# Patient Record
Sex: Female | Born: 1990 | Race: Black or African American | Hispanic: No | Smoking: Current every day smoker
Health system: Southern US, Community
[De-identification: ages and names within clinical notes are randomized; demographics above are authoritative.]

## PROBLEM LIST (undated history)

## (undated) ENCOUNTER — Inpatient Hospital Stay (HOSPITAL_COMMUNITY): Payer: Self-pay

## (undated) ENCOUNTER — Emergency Department (HOSPITAL_COMMUNITY): Admission: EM | Payer: Medicaid Other | Source: Home / Self Care

## (undated) DIAGNOSIS — B009 Herpesviral infection, unspecified: Secondary | ICD-10-CM

## (undated) DIAGNOSIS — F329 Major depressive disorder, single episode, unspecified: Secondary | ICD-10-CM

## (undated) DIAGNOSIS — A562 Chlamydial infection of genitourinary tract, unspecified: Secondary | ICD-10-CM

## (undated) DIAGNOSIS — D72829 Elevated white blood cell count, unspecified: Secondary | ICD-10-CM

## (undated) DIAGNOSIS — O24419 Gestational diabetes mellitus in pregnancy, unspecified control: Secondary | ICD-10-CM

## (undated) DIAGNOSIS — F32A Depression, unspecified: Secondary | ICD-10-CM

## (undated) DIAGNOSIS — N739 Female pelvic inflammatory disease, unspecified: Secondary | ICD-10-CM

## (undated) DIAGNOSIS — F419 Anxiety disorder, unspecified: Secondary | ICD-10-CM

---
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
## 2011-01-30 ENCOUNTER — Observation Stay (HOSPITAL_COMMUNITY)
Admission: AD | Admit: 2011-01-30 | Discharge: 2011-01-30 | Disposition: A | Discharge status: Home / Self Care | Payer: Medicaid Other | Source: Ambulatory Visit | Attending: Obstetrics and Gynecology | Admitting: Obstetrics and Gynecology

## 2011-01-30 ENCOUNTER — Observation Stay (HOSPITAL_COMMUNITY): Payer: Medicaid Other

## 2011-01-30 ENCOUNTER — Emergency Department (HOSPITAL_COMMUNITY)
Admission: EM | Admit: 2011-01-30 | Discharge: 2011-01-30 | Disposition: A | Discharge status: Other Acute Inpatient Hospital | Payer: Medicaid Other | Source: Home / Self Care | Attending: Emergency Medicine | Admitting: Emergency Medicine

## 2011-01-30 DIAGNOSIS — W108XXA Fall (on) (from) other stairs and steps, initial encounter: Secondary | ICD-10-CM | POA: Insufficient documentation

## 2011-01-30 DIAGNOSIS — O99891 Other specified diseases and conditions complicating pregnancy: Principal | ICD-10-CM | POA: Insufficient documentation

## 2011-01-30 DIAGNOSIS — Y92009 Unspecified place in unspecified non-institutional (private) residence as the place of occurrence of the external cause: Secondary | ICD-10-CM | POA: Insufficient documentation

## 2011-01-30 DIAGNOSIS — R109 Unspecified abdominal pain: Secondary | ICD-10-CM | POA: Insufficient documentation

## 2011-01-30 DIAGNOSIS — O368390 Maternal care for abnormalities of the fetal heart rate or rhythm, unspecified trimester, not applicable or unspecified: Secondary | ICD-10-CM | POA: Insufficient documentation

## 2011-01-30 LAB — COMPREHENSIVE METABOLIC PANEL
Alkaline Phosphatase: 56 U/L (ref 39–117)
BUN: 8 mg/dL (ref 6–23)
Chloride: 105 mEq/L (ref 96–112)
Creatinine, Ser: 0.72 mg/dL (ref 0.4–1.2)
Glucose, Bld: 81 mg/dL (ref 70–99)
Potassium: 3.8 mEq/L (ref 3.5–5.1)
Total Bilirubin: 0.4 mg/dL (ref 0.3–1.2)

## 2011-01-30 LAB — CBC
Hemoglobin: 12.1 g/dL (ref 12.0–15.0)
MCH: 29.8 pg (ref 26.0–34.0)
RDW: 14.1 % (ref 11.5–15.5)

## 2011-01-30 LAB — DIFFERENTIAL
Basophils Absolute: 0 10*3/uL (ref 0.0–0.1)
Basophils Relative: 0 % (ref 0–1)
Eosinophils Absolute: 0.1 10*3/uL (ref 0.0–0.7)
Monocytes Absolute: 0.7 10*3/uL (ref 0.1–1.0)
Neutro Abs: 13.1 10*3/uL — ABNORMAL HIGH (ref 1.7–7.7)
Neutrophils Relative %: 82 % — ABNORMAL HIGH (ref 43–77)

## 2011-01-30 LAB — KLEIHAUER-BETKE STAIN
Fetal Cells %: 0 %
Quantitation Fetal Hemoglobin: 0 mL

## 2011-02-13 LAB — URINALYSIS, ROUTINE W REFLEX MICROSCOPIC
Glucose, UA: NEGATIVE mg/dL
Hgb urine dipstick: NEGATIVE
pH: 6.5 (ref 5.0–8.0)

## 2011-02-13 LAB — URINE MICROSCOPIC-ADD ON

## 2011-02-13 LAB — URINE CULTURE

## 2011-02-14 LAB — POCT PREGNANCY, URINE: Preg Test, Ur: POSITIVE

## 2011-02-26 LAB — POCT PREGNANCY, URINE: Preg Test, Ur: NEGATIVE

## 2011-02-26 LAB — URINALYSIS, ROUTINE W REFLEX MICROSCOPIC
Ketones, ur: NEGATIVE mg/dL
Nitrite: NEGATIVE
Specific Gravity, Urine: 1.015 (ref 1.005–1.030)
pH: 6.5 (ref 5.0–8.0)

## 2011-03-13 LAB — URINE CULTURE

## 2011-03-13 LAB — URINE MICROSCOPIC-ADD ON

## 2011-03-13 LAB — URINALYSIS, ROUTINE W REFLEX MICROSCOPIC
Hgb urine dipstick: NEGATIVE
Protein, ur: 100 mg/dL — AB
Urobilinogen, UA: 1 mg/dL (ref 0.0–1.0)

## 2011-03-13 LAB — WET PREP, GENITAL: Trich, Wet Prep: NONE SEEN

## 2011-03-13 LAB — GC/CHLAMYDIA PROBE AMP, GENITAL: GC Probe Amp, Genital: NEGATIVE

## 2011-04-12 ENCOUNTER — Inpatient Hospital Stay (HOSPITAL_COMMUNITY)
Admission: AD | Admit: 2011-04-12 | Discharge: 2011-04-12 | Disposition: A | Discharge status: Home / Self Care | Payer: Medicaid Other | Source: Ambulatory Visit | Attending: Obstetrics and Gynecology | Admitting: Obstetrics and Gynecology

## 2011-04-12 DIAGNOSIS — O99891 Other specified diseases and conditions complicating pregnancy: Secondary | ICD-10-CM | POA: Insufficient documentation

## 2011-04-12 DIAGNOSIS — K2990 Gastroduodenitis, unspecified, without bleeding: Secondary | ICD-10-CM | POA: Insufficient documentation

## 2011-04-12 DIAGNOSIS — K2970 Gastritis, unspecified, without bleeding: Secondary | ICD-10-CM | POA: Insufficient documentation

## 2011-04-12 DIAGNOSIS — R109 Unspecified abdominal pain: Secondary | ICD-10-CM | POA: Insufficient documentation

## 2011-05-02 ENCOUNTER — Inpatient Hospital Stay (HOSPITAL_COMMUNITY)
Admission: AD | Admit: 2011-05-02 | Discharge: 2011-05-02 | Disposition: A | Discharge status: Home / Self Care | Payer: Medicaid Other | Source: Ambulatory Visit | Attending: Obstetrics and Gynecology | Admitting: Obstetrics and Gynecology

## 2011-05-02 DIAGNOSIS — O479 False labor, unspecified: Secondary | ICD-10-CM | POA: Insufficient documentation

## 2011-05-04 ENCOUNTER — Inpatient Hospital Stay (HOSPITAL_COMMUNITY)
Admission: AD | Admit: 2011-05-04 | Discharge: 2011-05-04 | Disposition: A | Discharge status: Home / Self Care | Payer: Medicaid Other | Source: Ambulatory Visit | Attending: Obstetrics and Gynecology | Admitting: Obstetrics and Gynecology

## 2011-05-04 DIAGNOSIS — G43909 Migraine, unspecified, not intractable, without status migrainosus: Secondary | ICD-10-CM | POA: Insufficient documentation

## 2011-05-04 DIAGNOSIS — O99891 Other specified diseases and conditions complicating pregnancy: Secondary | ICD-10-CM | POA: Insufficient documentation

## 2011-05-04 LAB — CBC
HCT: 34.7 % — ABNORMAL LOW (ref 36.0–46.0)
Hemoglobin: 12.4 g/dL (ref 12.0–15.0)
MCH: 28.3 pg (ref 26.0–34.0)
MCHC: 35.7 g/dL (ref 30.0–36.0)
MCV: 79.2 fL (ref 78.0–100.0)

## 2011-05-04 LAB — DIFFERENTIAL
Basophils Relative: 0 % (ref 0–1)
Monocytes Absolute: 0.9 10*3/uL (ref 0.1–1.0)
Monocytes Relative: 5 % (ref 3–12)
Neutro Abs: 14.1 10*3/uL — ABNORMAL HIGH (ref 1.7–7.7)

## 2011-05-04 LAB — URINALYSIS, ROUTINE W REFLEX MICROSCOPIC
Bilirubin Urine: NEGATIVE
Ketones, ur: NEGATIVE mg/dL
Nitrite: NEGATIVE
Protein, ur: NEGATIVE mg/dL
Urobilinogen, UA: 0.2 mg/dL (ref 0.0–1.0)

## 2011-05-04 LAB — LACTATE DEHYDROGENASE: LDH: 157 U/L (ref 94–250)

## 2011-05-04 LAB — COMPREHENSIVE METABOLIC PANEL
Alkaline Phosphatase: 144 U/L — ABNORMAL HIGH (ref 39–117)
BUN: 9 mg/dL (ref 6–23)
Glucose, Bld: 74 mg/dL (ref 70–99)
Potassium: 3.6 mEq/L (ref 3.5–5.1)
Total Bilirubin: 0.2 mg/dL — ABNORMAL LOW (ref 0.3–1.2)
Total Protein: 6.5 g/dL (ref 6.0–8.3)

## 2011-05-04 LAB — URIC ACID: Uric Acid, Serum: 5.4 mg/dL (ref 2.4–7.0)

## 2011-05-17 ENCOUNTER — Inpatient Hospital Stay (HOSPITAL_COMMUNITY)
Admission: AD | Admit: 2011-05-17 | Discharge: 2011-05-17 | Disposition: A | Discharge status: Home / Self Care | Payer: Medicaid Other | Source: Ambulatory Visit | Attending: Obstetrics and Gynecology | Admitting: Obstetrics and Gynecology

## 2011-05-17 DIAGNOSIS — O479 False labor, unspecified: Secondary | ICD-10-CM | POA: Insufficient documentation

## 2011-05-18 ENCOUNTER — Inpatient Hospital Stay (HOSPITAL_COMMUNITY)
Admission: AD | Admit: 2011-05-18 | Discharge: 2011-05-22 | DRG: 766 | Disposition: A | Discharge status: Home / Self Care | Payer: Medicaid Other | Source: Ambulatory Visit | Attending: Obstetrics and Gynecology | Admitting: Obstetrics and Gynecology

## 2011-05-18 LAB — CBC
Hemoglobin: 13.8 g/dL (ref 12.0–15.0)
MCHC: 36.5 g/dL — ABNORMAL HIGH (ref 30.0–36.0)
Platelets: 261 10*3/uL (ref 150–400)
RDW: 15.1 % (ref 11.5–15.5)

## 2011-05-19 ENCOUNTER — Other Ambulatory Visit: Payer: Self-pay | Admitting: Obstetrics and Gynecology

## 2011-05-19 LAB — RPR: RPR Ser Ql: NONREACTIVE

## 2011-05-20 LAB — CBC
Platelets: 192 10*3/uL (ref 150–400)
RDW: 15.3 % (ref 11.5–15.5)
WBC: 20.9 10*3/uL — ABNORMAL HIGH (ref 4.0–10.5)

## 2011-05-22 ENCOUNTER — Inpatient Hospital Stay (HOSPITAL_COMMUNITY): Payer: Medicaid Other

## 2011-05-29 NOTE — Discharge Summary (Signed)
Heather Lane, Heather Lane              ACCOUNT NO.:  [id number]  MEDICAL RECORD NO.:  [id number]  LOCATION:  9123                          FACILITY:  [facility]  PHYSICIAN:  Janine Limbo, M.D.DATE OF BIRTH:  Feb 15, 1991  DATE OF ADMISSION:  05/18/2011 DATE OF DISCHARGE:  05/22/2011                              DISCHARGE SUMMARY   ADMITTING DIAGNOSES: 1. Intrauterine pregnancy at 40-3/7th weeks. 2. Early labor with prolonged prodromal labor. 3. Group B strep negative.  DISCHARGE DIAGNOSES: 1. Intrauterine pregnancy at 40-4/7th weeks. 2. Failure to progress. 3. Nonreassuring fetal heart rate. 4. Meconium-stained fluid. 5. Postpartum anemia.  PROCEDURES: 1. Therapeutic sedation with morphine and Phenergan. 2. Primary low transverse cesarean section. 3. Epidural anesthesia.  HOSPITAL COURSE:  Heather Lane is an 20 year old gravida 1, para 0 at 77- 3/7th weeks, who presented on the morning of May 18, 2011, with 24 hours of uterine contractions that were painful.  She had been seen the day before in Maternity Admissions Unit with no cervical change after observation.  She was seen again in Maternity Admissions Unit on the day of admission.  Her cervix was still 1 cm, 50%.  She then received therapeutic rest with morphine and Phenergan and her cervix changed to 3 and 80%.  She was then admitted to Labor and Delivery.  After admission, she was observed for a time.  She then had some IV pain medication. Artificial rupture of membranes was deferred at this time and she was 4 cm.  She received an epidural later that evening.  There were some variables noted but overall fetal heart rate was reassuring.  At approximately 3 a.m., she had been 6, 100%, and 0 station.  Fetal heart rate is reassuring.  Pitocin augmentation was occurring.  By 6 a.m., she was still the same.  Cervix was becoming slightly edematous.  She was then assessed at 7:50 and cervix was even more edematous.  MVUs  were adequate and fetal heart rate was running 170-180.  She was therefore taken to the OR where a primary low transverse cesarean section was performed by Dr. Normand Sloop under epidural anesthesia.  Findings were a viable female, weight 6 pounds 12 ounces, Apgars were 9 and 9.  Infant was taken to the full-term nursery.  Mother was taken to recovery in good condition.  Her pregnancy in  general had been remarkable for: 1. Age 30. 2. History of STDs. 3. History of HSV 1. 4. Positive hemoglobin C. 5. Group B strep negative.  On postop day #1, the patient was doing well.  She was up ad lib.  She is tolerating a regular diet.  Her hemoglobin was 8.8, white blood cell count was 20.9 and platelet count was 192.  The father of the baby was present with her, although he was not her current partner.  The family did work through these issues and by the patient's postpartum stay, these issues have been dealt with, and all parties were communicating well.  Rest of the patient's hospital stay was uncomplicated.  Her orthostatics were stable.  She was up ad lib and ambulating without difficulty.  She was passing flatus.  She was breast-feeding.  By postop day #  3, she was continued to do well, however, she did notice some "lumps in her abdomen."  These were felt by the nurse midwife of record to the probable stool in her lower intestine.  A flat plate was done, showing a nonobstructive bowel gas pattern with stool throughout the colon to the level of the rectum.  No free air was noted.  There was a small curvature of the lumbar spine noted, otherwise it was unremarkable.  She was ready to use Depo-Provera and received a dose on the day of discharge.  She was then deemed to receive full benefit of her hospital stay and was discharged home in stable condition.  DISCHARGE INSTRUCTIONS:  Per [facility] handout.  DISCHARGE MEDICATIONS: 1. Motrin 600 mg p.o. q.2 hours p.r.n. pain. 2. Percocet  5/325, 1-2 p.o. q.3-4 hours p.r.n. pain.  Discharge followup will occur in 6 weeks at [facility] or p.r.n.     Renaldo Reel Heather Lane, C.N.M.   ______________________________ Janine Limbo, M.D.    VLL/MEDQ  D:  05/22/2011  T:  05/23/2011  Job:  [id number]  Electronically Signed by Nigel Bridgeman C.N.M. on 05/23/2011 03:00:16 PM Electronically Signed by Kirkland Hun M.D. on 05/29/2011 07:24:07 AM

## 2011-06-06 NOTE — Op Note (Signed)
Heather, Lane              ACCOUNT NO.:  [id number]  MEDICAL RECORD NO.:  [id number]  LOCATION:  9123                          FACILITY:  [facility]  PHYSICIAN:  Heather Lane, M.D. DATE OF BIRTH:  10-Jan-1991  DATE OF PROCEDURE:  05/19/2011 DATE OF DISCHARGE:                              OPERATIVE REPORT   PREOPERATIVE DIAGNOSES:  Term, failure to progress, nonreassuring fetal heart tones with meconium, active labor.  POSTOPERATIVE DIAGNOSES:  Term, failure to progress, nonreassuring fetal heart tones with meconium, active labor.  PROCEDURE:  A primary low transverse cesarean section with two-layer closure.  SURGEON:  Heather Fontenette A. Ethelreda Sukhu, MD  ASSISTANT:  __________, CNM  ANESTHESIA:  Epidural.  FINDINGS:  Female infant in vertex presentation, born at 8:36 a.m. Apgars of 9 and 9.  A weight of 6 pounds 12 ounces and meconium noted. Placenta was sent to Pathology.  ESTIMATED BLOOD LOSS:  1000 mL.  URINE OUTPUT:  250 mL of clear urine at the end of procedure.  LR:  1500 mL.  COMPLICATIONS:  None.  The patient to PACU in stable condition.  PROCEDURE IN DETAIL:  The patient was taken to the operating room where epidural anesthesia was found to be adequate.  She was prepped and draped in a normal sterile fashion.  Foley catheter was placed.  A low transverse Pfannenstiel incision was made with a scalpel and the patient epidural anesthesia had been tested and anesthesia was found to be adequate, did feel a little bit of pain.  So we stopped the procedure and injected 7 mL of 0.5% Marcaine with epi into the skin.  Once that was completed, the incision was then completed and the patient had no pain.  The incision was carried down to the fascia using Bovie cautery. The fascia was incised in the midline, extended bilaterally with Mayo scissors.  Kochers x2 was placed in the superior aspect of the fascia and was dissected off the rectus muscle in a similar fashion.   The inferior aspect of the fascia was dissected in a similar fashion with Kochers and Mayo scissors.  The muscles were separated in midline.  The peritoneum was identified and entered bluntly.  The muscles were then further separated.  The bladder blade was inserted.  The vesicouterine peritoneum was identified, tented up, entered sharply, and extended bilaterally using Metzenbaum scissors.  Bladder blade was reinserted.  A primary low transverse uterine incision was made with a scalpel and dissected bluntly bilaterally.  The infant was noted to have meconium. The infant was delivered without difficulty.  Cord was clamped and cut. Mouth and nares were bulb suctioned and handed off to awaiting neonatologist.  The placenta was delivered intact with several calcifications.  The uterus was cleared of all clot and debris.  The uterine incision was repaired with 0 Vicryl in a running locked fashion. There was a sinus on the posterior aspect of the incision which continued to bleed, I had to put 2 figure-of-eight sutures to stop it from bleeding, and then a second layer of 0 Vicryl was used to imbricate the uterus.  Irrigation was done, and there was a little bit of bleeding towards the patient's left  side of the uterus.  This was made hemostatic with a figure-of-eight of 0 Vicryl stitch.  Again irrigation was done. The patient had normal abdominal anatomy, normal-appearing tubes and ovaries and uterus.  Once hemostasis was assured, the peritoneum was closed with 0 chromic in a running fashion.  The fascia was closed with 0 Vicryl without difficulty.  The subcutaneous tissues were reapproximated with 2-0 plain, and the skin was closed 3-0 Monocryl in a subcuticular fashion.  Sponge, lap, and needle counts were correct.  The patient went to recovery room in stable condition.     Heather Lane, M.D.     NAD/MEDQ  D:  05/19/2011  T:  05/19/2011  Job:  [id number]  Electronically Signed by  Heather Lane M.D. on 06/06/2011 01:00:19 PM

## 2011-07-09 ENCOUNTER — Emergency Department (HOSPITAL_COMMUNITY)
Admission: EM | Admit: 2011-07-09 | Discharge: 2011-07-10 | Disposition: A | Discharge status: Home / Self Care | Payer: Medicaid Other | Attending: Emergency Medicine | Admitting: Emergency Medicine

## 2011-07-09 DIAGNOSIS — S02400A Malar fracture unspecified, initial encounter for closed fracture: Secondary | ICD-10-CM | POA: Insufficient documentation

## 2011-07-09 DIAGNOSIS — S02401A Maxillary fracture, unspecified, initial encounter for closed fracture: Secondary | ICD-10-CM | POA: Insufficient documentation

## 2011-07-09 DIAGNOSIS — S301XXA Contusion of abdominal wall, initial encounter: Secondary | ICD-10-CM | POA: Insufficient documentation

## 2011-07-09 DIAGNOSIS — R51 Headache: Secondary | ICD-10-CM | POA: Insufficient documentation

## 2011-07-09 DIAGNOSIS — R109 Unspecified abdominal pain: Secondary | ICD-10-CM | POA: Insufficient documentation

## 2011-07-09 DIAGNOSIS — S01501A Unspecified open wound of lip, initial encounter: Secondary | ICD-10-CM | POA: Insufficient documentation

## 2011-07-09 DIAGNOSIS — M25539 Pain in unspecified wrist: Secondary | ICD-10-CM | POA: Insufficient documentation

## 2011-07-09 DIAGNOSIS — IMO0002 Reserved for concepts with insufficient information to code with codable children: Secondary | ICD-10-CM | POA: Insufficient documentation

## 2011-07-10 ENCOUNTER — Emergency Department (HOSPITAL_COMMUNITY): Payer: Medicaid Other

## 2011-07-10 MED ORDER — IOHEXOL 300 MG/ML  SOLN
100.0000 mL | Freq: Once | INTRAMUSCULAR | Status: AC | PRN
  Administered 2011-07-10: 100 mL via INTRAVENOUS

## 2011-08-15 ENCOUNTER — Inpatient Hospital Stay (INDEPENDENT_AMBULATORY_CARE_PROVIDER_SITE_OTHER)
Admission: RE | Admit: 2011-08-15 | Discharge: 2011-08-15 | Disposition: A | Discharge status: Home / Self Care | Payer: Medicaid Other | Source: Ambulatory Visit | Attending: Family Medicine | Admitting: Family Medicine

## 2011-08-15 DIAGNOSIS — J029 Acute pharyngitis, unspecified: Secondary | ICD-10-CM

## 2011-08-15 DIAGNOSIS — J039 Acute tonsillitis, unspecified: Secondary | ICD-10-CM

## 2011-08-28 LAB — WET PREP, GENITAL

## 2011-08-28 LAB — URINALYSIS, ROUTINE W REFLEX MICROSCOPIC
Ketones, ur: 15 — AB
Nitrite: NEGATIVE
Protein, ur: NEGATIVE
Urobilinogen, UA: 1
pH: 6.5

## 2011-08-28 LAB — CBC
HCT: 37.4
Hemoglobin: 13
RBC: 4.47
WBC: 17.9 — ABNORMAL HIGH

## 2011-08-28 LAB — DIFFERENTIAL
Eosinophils Relative: 3
Lymphocytes Relative: 12 — ABNORMAL LOW
Lymphs Abs: 2.1
Monocytes Absolute: 1.1

## 2011-08-28 LAB — POCT PREGNANCY, URINE: Operator id: 277751

## 2011-08-28 LAB — URINE MICROSCOPIC-ADD ON

## 2011-08-28 LAB — GC/CHLAMYDIA PROBE AMP, GENITAL: GC Probe Amp, Genital: NEGATIVE

## 2012-07-29 ENCOUNTER — Ambulatory Visit: Payer: Medicaid Other | Admitting: Obstetrics and Gynecology

## 2012-07-30 ENCOUNTER — Ambulatory Visit: Payer: Self-pay | Admitting: Obstetrics and Gynecology

## 2012-10-23 ENCOUNTER — Encounter: Payer: Self-pay | Admitting: Obstetrics and Gynecology

## 2012-10-23 ENCOUNTER — Ambulatory Visit (INDEPENDENT_AMBULATORY_CARE_PROVIDER_SITE_OTHER): Payer: Medicaid Other | Admitting: Obstetrics and Gynecology

## 2012-10-23 VITALS — BP 98/60 | Ht 63.0 in | Wt 131.0 lb

## 2012-10-23 DIAGNOSIS — N970 Female infertility associated with anovulation: Secondary | ICD-10-CM

## 2012-10-23 DIAGNOSIS — Z00129 Encounter for routine child health examination without abnormal findings: Secondary | ICD-10-CM

## 2012-10-23 MED ORDER — ETONOGESTREL-ETHINYL ESTRADIOL 0.12-0.015 MG/24HR VA RING: VAGINAL_RING | VAGINAL | Status: DC

## 2012-10-23 NOTE — Progress Notes (Signed)
The patient reports:using the bathroom more frequently x 8 months   Contraception:none  Last mammogram: n/a Last pap: n/a  GC/Chlamydia cultures offered: declined HIV/RPR/HbsAg offered:  declined HSV 1 and 2 glycoprotein offered: declined  Menstrual cycle regular and monthly: No: very irregular has had a cycle as 2 weeks long and then skip month. Last Depo-Provera Sept 2012. Post partum 6 weeks of bleeding - depo provera - no cycles for 2 months. No bleeding after Depo provera in 08/2011. Cycle restarted January 2013. Cycles 28 - 60 days. Duration: 10 days - 3 weeks. Able to wear tampon for 4 hours. "string" clots. Cramping in lower abdomin radiating to back. Pain scale = 4/10. Takes tylenol for pain.   Menstrual flow normal: Yes  Urinary symptoms: urinary frequency Normal bowel movements: Yes Reports abuse at home: No:    Subjective    Heather Lane is a 21 y.o. female, G1P1, who presents for an annual exam.     History   Social History  . Marital Status: Single    Spouse Name: N/A    Number of Children: N/A  . Years of Education: N/A   Social History Main Topics  . Smoking status: Current Every Day Smoker  . Smokeless tobacco: Never Used  . Alcohol Use: No  . Drug Use: No  . Sexually Active: Yes -- Female partner(s)    Birth Control/ Protection: None   Other Topics Concern  . None   Social History Narrative  . None    Menstrual cycle:   LMP: Patient's last menstrual period was 09/30/2012.           Cycle: see above  The following portions of the patient's history were reviewed and updated as appropriate: allergies, current medications, past family history, past medical history, past social history, past surgical history and problem list.  Review of Systems Pertinent items are noted in HPI. Breast:Negative for breast lump,nipple discharge or nipple retraction Gastrointestinal: Negative for abdominal pain, change in bowel habits or rectal  bleeding Urinary:negative   Objective:    BP 98/60  Ht 5\' 3"  (1.6 m)  Wt 131 lb (59.421 kg)  BMI 23.21 kg/m2  LMP 09/30/2012    Weight:  Wt Readings from Last 1 Encounters:  10/23/12 131 lb (59.421 kg) (54.97%*)   * Growth percentiles are based on CDC 2-20 Years data.          BMI: Body mass index is 23.21 kg/(m^2).  General Appearance: Alert, appropriate appearance for age. No acute distress HEENT: Grossly normal Neck / Thyroid: Supple, no masses, nodes or enlargement Lungs: clear to auscultation bilaterally Back: No CVA tenderness Breast Exam: No masses or nodes.No dimpling, nipple retraction or discharge. Cardiovascular: Regular rate and rhythm. S1, S2, no murmur Gastrointestinal: Soft, non-tender, no masses or organomegaly Pelvic Exam: Vulva and vagina appear normal. Bimanual exam reveals normal uterus and adnexa. Rectovaginal: not indicated Lymphatic Exam: Non-palpable nodes in neck, clavicular, axillary, or inguinal regions Skin: no rash or abnormalities Neurologic: Normal gait and speech, no tremor  Psychiatric: Alert and oriented, appropriate affect.   Assessment:    Likely anovulatory bleding  Some overactive bladder   Plan:     STD screening: declined Contraception:no method Progesterone Discussed  Removal of Caffeine and Carbonated Drinks discussed  Combined oral contraceptives was reviewed with the patient      With expected benefits of: cycle control, reduction in menstrual flow and dysmenorrhea, improvement of PMS and reduction of ovarian cysts and ovarian cancer. Risks  of DVT/PE discussed.  Nuvaring was also reviewed With added benefit of monthly use as opposed to daily use was also reviewed.Pt desires trial. Return in 3 months for follow-up  Tobacco use: positive for approximately 0.5 packs per day.  Patient advised to quit smoking, withouthistory of DVT/PE. Pertinent medical history:current smoker     Silverio Lay MD

## 2013-01-27 ENCOUNTER — Inpatient Hospital Stay (HOSPITAL_COMMUNITY)
Admission: AD | Admit: 2013-01-27 | Discharge: 2013-01-27 | Disposition: A | Discharge status: Home / Self Care | Payer: Medicaid Other | Source: Ambulatory Visit | Attending: Obstetrics and Gynecology | Admitting: Obstetrics and Gynecology

## 2013-01-27 DIAGNOSIS — N912 Amenorrhea, unspecified: Secondary | ICD-10-CM | POA: Insufficient documentation

## 2013-01-27 DIAGNOSIS — O34219 Maternal care for unspecified type scar from previous cesarean delivery: Secondary | ICD-10-CM | POA: Insufficient documentation

## 2013-01-27 DIAGNOSIS — F17210 Nicotine dependence, cigarettes, uncomplicated: Secondary | ICD-10-CM | POA: Insufficient documentation

## 2013-01-27 DIAGNOSIS — Z3202 Encounter for pregnancy test, result negative: Secondary | ICD-10-CM | POA: Insufficient documentation

## 2013-01-27 LAB — POCT PREGNANCY, URINE: Preg Test, Ur: NEGATIVE

## 2013-01-27 MED ORDER — MEDROXYPROGESTERONE ACETATE 150 MG/ML IM SUSP: 150.0000 mg | INTRAMUSCULAR | Status: DC

## 2013-01-27 NOTE — MAU Provider Note (Signed)
History   22 yo G1P1 presented unannounced requesting a pregnancy test due to no cycle since 12/21/12, with cycle due approx 2/19. Had SVD 2012, with DepoProvera as contraceptive choice after that.  She had irregular bleeding, and was started on Nuvaring in November.  Had cycle in December and January.  Used Nuvaring until January, when she stopped--has not been using any contraception since then, "but doesn't want to get pregnant".  Wants to restart DepoProvera--plans to schedule appt in office to restart.  Last IC yesterday.    Has not taken a pregnancy test.  "Just wanted to be sure", when asked why she came to hospital for UPT.  Patient Active Problem List  Diagnosis  . Previous cesarean delivery  . Smoker    Chief Complaint  Patient presents with  . Possible Pregnancy     OB History   Grav Para Term Preterm Abortions TAB SAB Ect Mult Living   1 1        1       No past medical history on file.  Past Surgical History  Procedure Laterality Date  . Cesarean section      Family History  Problem Relation Age of Onset  . Diabetes Sister   . Diabetes Maternal Aunt   . Hypertension Maternal Aunt   . Diabetes Maternal Uncle   . Hypertension Maternal Uncle     History  Substance Use Topics  . Smoking status: Current Every Day Smoker  . Smokeless tobacco: Never Used  . Alcohol Use: No    Allergies: No Known Allergies  No prescriptions prior to admission     Physical Exam   Blood pressure 124/89, pulse 78, temperature 98.3 F (36.8 C), temperature source Oral, resp. rate 18, height 5\' 5"  (1.651 m), weight 124 lb (56.246 kg), last menstrual period 12/21/2012, SpO2 100.00%.  PE deferred due to no physical complaints  UPT negative  ED Course  Amenorrhea--s/p stopping Nuvaring Non-contraceptor Desires restart of DepoProvera  Plan: Reviewed necessity of using contraceptive protection.  Discussed that UPT is negative at present, but she could have conceived  recently, with UPT still negative.  Advised no unprotected IC until restarts Depo.   Will call to schedule appt with me in 2 weeks.  If UPT negative then, and no unprotected IC, I will restart her on Depo. To call with any questions or concerns.   Nigel Bridgeman CNM, [place] 01/27/2013 8:28 PM

## 2013-01-27 NOTE — MAU Note (Signed)
Pt states she is just here to get a preg test. Denies problems

## 2013-07-09 ENCOUNTER — Emergency Department (HOSPITAL_COMMUNITY)
Admission: EM | Admit: 2013-07-09 | Discharge: 2013-07-09 | Disposition: A | Discharge status: Home / Self Care | Payer: Self-pay | Attending: Emergency Medicine | Admitting: Emergency Medicine

## 2013-07-09 ENCOUNTER — Encounter (HOSPITAL_COMMUNITY): Payer: Self-pay | Admitting: Emergency Medicine

## 2013-07-09 DIAGNOSIS — M545 Low back pain, unspecified: Secondary | ICD-10-CM | POA: Insufficient documentation

## 2013-07-09 DIAGNOSIS — R3915 Urgency of urination: Secondary | ICD-10-CM | POA: Insufficient documentation

## 2013-07-09 DIAGNOSIS — R300 Dysuria: Secondary | ICD-10-CM | POA: Insufficient documentation

## 2013-07-09 DIAGNOSIS — Z3202 Encounter for pregnancy test, result negative: Secondary | ICD-10-CM | POA: Insufficient documentation

## 2013-07-09 DIAGNOSIS — F17200 Nicotine dependence, unspecified, uncomplicated: Secondary | ICD-10-CM | POA: Insufficient documentation

## 2013-07-09 DIAGNOSIS — R350 Frequency of micturition: Secondary | ICD-10-CM | POA: Insufficient documentation

## 2013-07-09 LAB — URINALYSIS, ROUTINE W REFLEX MICROSCOPIC
Bilirubin Urine: NEGATIVE
Glucose, UA: NEGATIVE mg/dL
Ketones, ur: NEGATIVE mg/dL
Nitrite: NEGATIVE
Protein, ur: NEGATIVE mg/dL

## 2013-07-09 LAB — URINE MICROSCOPIC-ADD ON

## 2013-07-09 MED ORDER — CIPROFLOXACIN HCL 500 MG PO TABS: 500.0000 mg | ORAL_TABLET | Freq: Two times a day (BID) | ORAL | Status: DC

## 2013-07-09 MED ORDER — PHENAZOPYRIDINE HCL 200 MG PO TABS: 200.0000 mg | ORAL_TABLET | Freq: Three times a day (TID) | ORAL | Status: DC

## 2013-07-09 NOTE — ED Notes (Signed)
Pt presenting to ed with urinary frequency, urgency and pressure. Pt states when she wipes she is also seeing blood on the tissue. Pt states she has history of bladder problems s/p c-section in 2012

## 2013-07-09 NOTE — ED Provider Notes (Signed)
CSN: [id number]     Arrival date & time 07/09/13  1247 History     First MD Initiated Contact with Patient 07/09/13 1256     Chief Complaint  Patient presents with  . Dysuria   (Consider location/radiation/quality/duration/timing/severity/associated sxs/prior Treatment) Patient is a 22 y.o. female presenting with dysuria. The history is provided by the patient and medical records.  Dysuria  Patient presented to the ED with complaint of urinary frequency, urgency, and pelvic pressure upon urination beginning this morning. Patient states after the birth of her daughter via cesarean section in 2012, she has had problems with overactive bladder and recurrent urinary tract infections. Patient states there was some blood in her urine this morning-- she is certain this is not menstrual blood.  Patient also notes some intermittent, mild, non-radiating, left lower back pain, worse when urinating.  No fevers, sweats, or chills. No vaginal discharge or concern for STD at this time.  No hx of kidney stones.  History reviewed. No pertinent past medical history. Past Surgical History  Procedure Laterality Date  . Cesarean section     Family History  Problem Relation Age of Onset  . Diabetes Sister   . Diabetes Maternal Aunt   . Hypertension Maternal Aunt   . Diabetes Maternal Uncle   . Hypertension Maternal Uncle    History  Substance Use Topics  . Smoking status: Current Every Day Smoker -- 10.00 packs/day    Types: Cigarettes  . Smokeless tobacco: Never Used  . Alcohol Use: No   OB History   Grav Para Term Preterm Abortions TAB SAB Ect Mult Living   1 1        1      Review of Systems  Genitourinary: Positive for dysuria, urgency and hematuria.  All other systems reviewed and are negative.    Allergies  Review of patient's allergies indicates no known allergies.  Home Medications  No current outpatient prescriptions on file. BP 93/57  Pulse 76  Temp(Src) 99 F (37.2 C) (Oral)   Resp 18  SpO2 98%  LMP 07/02/2013  Physical Exam  Nursing note and vitals reviewed. Constitutional: She is oriented to person, place, and time. She appears well-developed and well-nourished. No distress.  HENT:  Head: Normocephalic and atraumatic.  Eyes: Conjunctivae and EOM are normal. Pupils are equal, round, and reactive to light.  Neck: Normal range of motion. Neck supple.  Cardiovascular: Normal rate, regular rhythm and normal heart sounds.   Pulmonary/Chest: Effort normal and breath sounds normal. No respiratory distress. She has no wheezes.  Abdominal: Soft. Bowel sounds are normal. There is no tenderness. There is CVA tenderness (mild, left). There is no guarding.  Musculoskeletal: Normal range of motion. She exhibits no edema.  Neurological: She is alert and oriented to person, place, and time.  Skin: Skin is warm and dry. She is not diaphoretic.  Psychiatric: She has a normal mood and affect.    ED Course   Procedures (including critical care time)  Labs Reviewed  URINALYSIS, ROUTINE W REFLEX MICROSCOPIC - Abnormal; Notable for the following:    APPearance CLOUDY (*)    Hgb urine dipstick LARGE (*)    Leukocytes, UA LARGE (*)    All other components within normal limits  URINE MICROSCOPIC-ADD ON - Abnormal; Notable for the following:    Bacteria, UA FEW (*)    All other components within normal limits  URINE CULTURE  POCT PREGNANCY, URINE   No results found. 1. Dysuria  2. Urinary frequency     MDM   u-preg negative.  U/a without definitive infection, culture pending.  Sx possibly due to kidney stones but pt has no prior hx of this.  Given pts hx and current sx, more likely early pyelonephritis. Will start on cipro and pyridium.  FU with wellness clinic if sx not improving.  Discussed plan with pt, she agreed.  Return precautions advised.  Garlon Hatchet, PA-C 07/09/13 1545

## 2013-07-09 NOTE — ED Provider Notes (Signed)
Medical screening examination/treatment/procedure(s) were performed by non-physician practitioner and as supervising physician I was immediately available for consultation/collaboration.    Ciin Brazzel R Gustavia Carie, MD 07/09/13 1608 

## 2013-07-09 NOTE — Progress Notes (Signed)
[facility] provided patient with a list of primary care resources and a [facility] orange card application, which will help get her set up with a pcp.

## 2013-07-10 LAB — URINE CULTURE
Colony Count: NO GROWTH
Culture: NO GROWTH

## 2014-03-12 ENCOUNTER — Encounter (HOSPITAL_COMMUNITY): Payer: Self-pay | Admitting: Emergency Medicine

## 2014-03-12 ENCOUNTER — Inpatient Hospital Stay (HOSPITAL_COMMUNITY)
Admission: EM | Admit: 2014-03-12 | Discharge: 2014-03-15 | DRG: 759 | Disposition: A | Discharge status: Home / Self Care | Payer: Medicaid Other | Attending: Internal Medicine | Admitting: Internal Medicine

## 2014-03-12 ENCOUNTER — Emergency Department (HOSPITAL_COMMUNITY): Payer: Medicaid Other

## 2014-03-12 DIAGNOSIS — E876 Hypokalemia: Secondary | ICD-10-CM

## 2014-03-12 DIAGNOSIS — Z791 Long term (current) use of non-steroidal anti-inflammatories (NSAID): Secondary | ICD-10-CM

## 2014-03-12 DIAGNOSIS — K529 Noninfective gastroenteritis and colitis, unspecified: Secondary | ICD-10-CM

## 2014-03-12 DIAGNOSIS — R000 Tachycardia, unspecified: Secondary | ICD-10-CM

## 2014-03-12 DIAGNOSIS — D72829 Elevated white blood cell count, unspecified: Secondary | ICD-10-CM | POA: Diagnosis present

## 2014-03-12 DIAGNOSIS — N730 Acute parametritis and pelvic cellulitis: Secondary | ICD-10-CM

## 2014-03-12 DIAGNOSIS — R109 Unspecified abdominal pain: Secondary | ICD-10-CM

## 2014-03-12 DIAGNOSIS — F17200 Nicotine dependence, unspecified, uncomplicated: Secondary | ICD-10-CM

## 2014-03-12 DIAGNOSIS — O34219 Maternal care for unspecified type scar from previous cesarean delivery: Secondary | ICD-10-CM

## 2014-03-12 DIAGNOSIS — A549 Gonococcal infection, unspecified: Secondary | ICD-10-CM

## 2014-03-12 DIAGNOSIS — I880 Nonspecific mesenteric lymphadenitis: Secondary | ICD-10-CM

## 2014-03-12 DIAGNOSIS — K5289 Other specified noninfective gastroenteritis and colitis: Secondary | ICD-10-CM

## 2014-03-12 DIAGNOSIS — R509 Fever, unspecified: Secondary | ICD-10-CM | POA: Diagnosis present

## 2014-03-12 DIAGNOSIS — N739 Female pelvic inflammatory disease, unspecified: Principal | ICD-10-CM | POA: Diagnosis present

## 2014-03-12 LAB — LIPASE, BLOOD: Lipase: 16 U/L (ref 11–59)

## 2014-03-12 LAB — WET PREP, GENITAL
Trich, Wet Prep: NONE SEEN
Yeast Wet Prep HPF POC: NONE SEEN

## 2014-03-12 LAB — CBC WITH DIFFERENTIAL/PLATELET
Basophils Absolute: 0 10*3/uL (ref 0.0–0.1)
Basophils Relative: 0 % (ref 0–1)
EOS ABS: 0 10*3/uL (ref 0.0–0.7)
Eosinophils Relative: 0 % (ref 0–5)
HEMATOCRIT: 38.7 % (ref 36.0–46.0)
Hemoglobin: 14.4 g/dL (ref 12.0–15.0)
LYMPHS ABS: 0.8 10*3/uL (ref 0.7–4.0)
Lymphocytes Relative: 4 % — ABNORMAL LOW (ref 12–46)
MCH: 30.8 pg (ref 26.0–34.0)
MCHC: 37.2 g/dL — ABNORMAL HIGH (ref 30.0–36.0)
MCV: 82.9 fL (ref 78.0–100.0)
MONO ABS: 0.3 10*3/uL (ref 0.1–1.0)
MONOS PCT: 1 % — AB (ref 3–12)
Neutro Abs: 21.4 10*3/uL — ABNORMAL HIGH (ref 1.7–7.7)
Neutrophils Relative %: 95 % — ABNORMAL HIGH (ref 43–77)
Platelets: 229 10*3/uL (ref 150–400)
RBC: 4.67 MIL/uL (ref 3.87–5.11)
RDW: 14.6 % (ref 11.5–15.5)
WBC: 22.5 10*3/uL — ABNORMAL HIGH (ref 4.0–10.5)

## 2014-03-12 LAB — URINALYSIS, ROUTINE W REFLEX MICROSCOPIC
Glucose, UA: NEGATIVE mg/dL
Hgb urine dipstick: NEGATIVE
KETONES UR: NEGATIVE mg/dL
Nitrite: NEGATIVE
PH: 7.5 (ref 5.0–8.0)
PROTEIN: 30 mg/dL — AB
Specific Gravity, Urine: 1.027 (ref 1.005–1.030)
Urobilinogen, UA: 1 mg/dL (ref 0.0–1.0)

## 2014-03-12 LAB — COMPREHENSIVE METABOLIC PANEL
ALK PHOS: 49 U/L (ref 39–117)
ALT: 21 U/L (ref 0–35)
AST: 15 U/L (ref 0–37)
Albumin: 4.1 g/dL (ref 3.5–5.2)
BUN: 12 mg/dL (ref 6–23)
CO2: 24 mEq/L (ref 19–32)
Calcium: 9.2 mg/dL (ref 8.4–10.5)
Chloride: 101 mEq/L (ref 96–112)
Creatinine, Ser: 0.98 mg/dL (ref 0.50–1.10)
GFR calc non Af Amer: 82 mL/min — ABNORMAL LOW (ref >90)
GLUCOSE: 102 mg/dL — AB (ref 70–99)
POTASSIUM: 3.4 meq/L — AB (ref 3.7–5.3)
Sodium: 139 mEq/L (ref 137–147)
TOTAL PROTEIN: 7.1 g/dL (ref 6.0–8.3)
Total Bilirubin: 1.2 mg/dL (ref 0.3–1.2)

## 2014-03-12 LAB — URINE MICROSCOPIC-ADD ON

## 2014-03-12 LAB — POC URINE PREG, ED: PREG TEST UR: NEGATIVE

## 2014-03-12 MED ORDER — HYDROMORPHONE HCL PF 1 MG/ML IJ SOLN
1.0000 mg | INTRAMUSCULAR | Status: DC | PRN
  Administered 2014-03-12 – 2014-03-13 (×4): 1 mg via INTRAVENOUS
  Filled 2014-03-12 (×4): qty 1

## 2014-03-12 MED ORDER — HYDROMORPHONE HCL PF 1 MG/ML IJ SOLN
1.0000 mg | Freq: Once | INTRAMUSCULAR | Status: AC
  Administered 2014-03-12: 1 mg via INTRAVENOUS
  Filled 2014-03-12: qty 1

## 2014-03-12 MED ORDER — SODIUM CHLORIDE 0.9 % IV SOLN: INTRAVENOUS | Status: DC

## 2014-03-12 MED ORDER — CIPROFLOXACIN IN D5W 400 MG/200ML IV SOLN
400.0000 mg | Freq: Once | INTRAVENOUS | Status: AC
  Administered 2014-03-12: 400 mg via INTRAVENOUS
  Filled 2014-03-12: qty 200

## 2014-03-12 MED ORDER — METRONIDAZOLE IN NACL 5-0.79 MG/ML-% IV SOLN
500.0000 mg | Freq: Once | INTRAVENOUS | Status: AC
  Administered 2014-03-12: 500 mg via INTRAVENOUS
  Filled 2014-03-12: qty 100

## 2014-03-12 MED ORDER — IOHEXOL 300 MG/ML  SOLN
80.0000 mL | Freq: Once | INTRAMUSCULAR | Status: AC | PRN
  Administered 2014-03-12: 80 mL via INTRAVENOUS

## 2014-03-12 MED ORDER — ACETAMINOPHEN 325 MG PO TABS: 650.0000 mg | ORAL_TABLET | Freq: Four times a day (QID) | ORAL | Status: DC | PRN

## 2014-03-12 MED ORDER — POTASSIUM CHLORIDE 10 MEQ/100ML IV SOLN
10.0000 meq | INTRAVENOUS | Status: AC
  Administered 2014-03-12 (×2): 10 meq via INTRAVENOUS
  Filled 2014-03-12 (×4): qty 100

## 2014-03-12 MED ORDER — SODIUM CHLORIDE 0.9 % IV BOLUS (SEPSIS)
1000.0000 mL | Freq: Once | INTRAVENOUS | Status: AC
  Administered 2014-03-12: 1000 mL via INTRAVENOUS

## 2014-03-12 MED ORDER — CIPROFLOXACIN IN D5W 400 MG/200ML IV SOLN
400.0000 mg | Freq: Two times a day (BID) | INTRAVENOUS | Status: DC
  Administered 2014-03-12 – 2014-03-14 (×4): 400 mg via INTRAVENOUS
  Filled 2014-03-12 (×6): qty 200

## 2014-03-12 MED ORDER — ONDANSETRON HCL 4 MG PO TABS
4.0000 mg | ORAL_TABLET | Freq: Four times a day (QID) | ORAL | Status: DC | PRN
  Administered 2014-03-15: 4 mg via ORAL
  Filled 2014-03-12: qty 1

## 2014-03-12 MED ORDER — ONDANSETRON HCL 4 MG/2ML IJ SOLN
4.0000 mg | Freq: Once | INTRAMUSCULAR | Status: AC
  Administered 2014-03-12: 4 mg via INTRAVENOUS
  Filled 2014-03-12: qty 2

## 2014-03-12 MED ORDER — METRONIDAZOLE IN NACL 5-0.79 MG/ML-% IV SOLN
500.0000 mg | Freq: Three times a day (TID) | INTRAVENOUS | Status: DC
  Administered 2014-03-12 – 2014-03-14 (×5): 500 mg via INTRAVENOUS
  Filled 2014-03-12 (×8): qty 100

## 2014-03-12 MED ORDER — SODIUM CHLORIDE 0.9 % IV SOLN
INTRAVENOUS | Status: AC
  Administered 2014-03-12: 20:00:00 via INTRAVENOUS

## 2014-03-12 MED ORDER — ONDANSETRON HCL 4 MG/2ML IJ SOLN
4.0000 mg | Freq: Three times a day (TID) | INTRAMUSCULAR | Status: DC | PRN
Start: 2014-03-12 — End: 2014-03-12

## 2014-03-12 MED ORDER — IOHEXOL 300 MG/ML  SOLN
50.0000 mL | Freq: Once | INTRAMUSCULAR | Status: AC | PRN
  Administered 2014-03-12: 50 mL via ORAL

## 2014-03-12 MED ORDER — ONDANSETRON HCL 4 MG/2ML IJ SOLN
4.0000 mg | Freq: Four times a day (QID) | INTRAMUSCULAR | Status: DC | PRN
  Administered 2014-03-13 – 2014-03-14 (×2): 4 mg via INTRAVENOUS
  Filled 2014-03-12 (×2): qty 2

## 2014-03-12 MED ORDER — ACETAMINOPHEN 500 MG PO TABS
1000.0000 mg | ORAL_TABLET | Freq: Once | ORAL | Status: AC
  Administered 2014-03-12: 1000 mg via ORAL
  Filled 2014-03-12: qty 2

## 2014-03-12 MED ORDER — ACETAMINOPHEN 650 MG RE SUPP: 650.0000 mg | Freq: Four times a day (QID) | RECTAL | Status: DC | PRN

## 2014-03-12 MED ORDER — HYDROCODONE-ACETAMINOPHEN 5-325 MG PO TABS
1.0000 | ORAL_TABLET | ORAL | Status: DC | PRN
  Administered 2014-03-13 – 2014-03-15 (×11): 2 via ORAL
  Filled 2014-03-12 (×11): qty 2

## 2014-03-12 MED ORDER — HYDROMORPHONE HCL PF 1 MG/ML IJ SOLN: 1.0000 mg | INTRAMUSCULAR | Status: DC | PRN

## 2014-03-12 NOTE — H&P (Signed)
History and Physical       Hospital Admission Note Date: 03/12/2014  Patient name: Heather Lane Medical record number: [id number] Date of birth: 03-10-1991 Age: 23 y.o. Gender: female  PCP: Pcp Not In System    Chief Complaint:  Abdominal pain for last 3-4 days, worse since yesterday  HPI: Patient is a 23 year old female with no certain past medical history who presented to the ER with severe abdominal pain, diffuse worse since yesterday. Patient reports that she started having crampy abdominal pain on Monday, 4 days ago coinciding with her menstrual cycle. Yesterday the pain became worse around 7 PM, severe, crampy, diffuse, 9/10, patient tried heating pad at home with no significant improvement. This morning patient had one episode of nausea and vomiting and watery diarrhea. As the pain was worsening, patient is in to the ER for further workup. ER workup showed white count of 22.5, potassium 3.4 abdominal and pelvic ultrasound did not show any ovarian torsion. CT abdomen and pelvis did not show any acute appendicitis but did show entritis with mesenteric adenitis.  Review of Systems:  Constitutional: Denies fever, chills, diaphoresis, poor appetite and fatigue.  HEENT: Denies photophobia, eye pain, redness, hearing loss, ear pain, congestion, sore throat, rhinorrhea, sneezing, mouth sores, trouble swallowing, neck pain, neck stiffness and tinnitus.   Respiratory: Denies SOB, DOE, cough, chest tightness,  and wheezing.   Cardiovascular: Denies chest pain, palpitations and leg swelling.  Gastrointestinal: See history of present illness  skeletal: Denies myalgias, back pain, joint swelling, arthralgias and gait problem.  Skin: Denies pallor, rash and wound.  Neurological: Denies dizziness, seizures, syncope, weakness, light-headedness, numbness and headaches.  Hematological: Denies adenopathy. Easy bruising, personal or family bleeding  history  Psychiatric/Behavioral: Denies suicidal ideation, mood changes, confusion, nervousness, sleep disturbance and agitation  Past Medical History: History reviewed. No pertinent past medical history. Past Surgical History  Procedure Laterality Date  . Cesarean section      Medications: Prior to Admission medications   Medication Sig Start Date End Date Taking? Authorizing Provider  ibuprofen (ADVIL,MOTRIN) 200 MG tablet Take 200 mg by mouth every 6 (six) hours as needed for cramping.   Yes Historical Provider, MD    Allergies:  No Known Allergies  Social History:  reports that she has been smoking Cigarettes.  She has been smoking about 1.00 pack per day. She has never used smokeless tobacco. She reports that she drinks about 1.8 ounces of alcohol per week. She reports that she does not use illicit drugs.  Family History: Family History  Problem Relation Age of Onset  . Diabetes Sister   . Diabetes Maternal Aunt   . Hypertension Maternal Aunt   . Diabetes Maternal Uncle   . Hypertension Maternal Uncle     Physical Exam: Blood pressure 96/72, pulse 121, temperature 99.2 F (37.3 C), temperature source Oral, resp. rate 14, last menstrual period 03/10/2014, SpO2 96.00%. General: Alert, awake, oriented x3, in no acute distress, tearful with abdominal discomfort . HEENT: normocephalic, atraumatic, anicteric sclera, pink conjunctiva, pupils equal and reactive to light and accomodation, oropharynx clear Neck: supple, no masses or lymphadenopathy, no goiter, no bruits  Heart: Regular rate and rhythm, without murmurs, rubs or gallops. Lungs: Clear to auscultation bilaterally, no wheezing, rales or rhonchi. Abdomen: Soft,  diffuse tenderness to deep palpation with voluntary guarding, nondistended, positive bowel sounds, no masses. Extremities: No clubbing, cyanosis or edema with positive pedal pulses. Neuro: Grossly intact, no focal neurological deficits, strength 5/5 upper and  lower  extremities bilaterally Psych: alert and oriented x 3,  anxious and tearful  Skin: no rashes or lesions, warm and dry   LABS on Admission:  Basic Metabolic Panel:  Recent Labs Lab 03/12/14 1135  NA 139  K 3.4*  CL 101  CO2 24  GLUCOSE 102*  BUN 12  CREATININE 0.98  CALCIUM 9.2   Liver Function Tests:  Recent Labs Lab 03/12/14 1135  AST 15  ALT 21  ALKPHOS 49  BILITOT 1.2  PROT 7.1  ALBUMIN 4.1    Recent Labs Lab 03/12/14 1135  LIPASE 16   No results found for this basename: AMMONIA,  in the last 168 hours CBC:  Recent Labs Lab 03/12/14 1135  WBC 22.5*  NEUTROABS 21.4*  HGB 14.4  HCT 38.7  MCV 82.9  PLT 229   Cardiac Enzymes: No results found for this basename: CKTOTAL, CKMB, CKMBINDEX, TROPONINI,  in the last 168 hours BNP: No components found with this basename: POCBNP,  CBG: No results found for this basename: GLUCAP,  in the last 168 hours   Radiological Exams on Admission: US Transvaginal Non-ob  03/12/2014   CLINICAL DATA:  Pelvic pain  EXAM: TRANSABDOMINAL AND TRANSVAGINAL ULTRASOUND OF PELVIS  DOPPLER ULTRASOUND OF OVARIES  TECHNIQUE: Both transabdominal and transvaginal ultrasound examinations of the pelvis were performed. Transabdominal technique was performed for global imaging of the pelvis including uterus, ovaries, adnexal regions, and pelvic cul-de-sac.  It was necessary to proceed with endovaginal exam following the transabdominal exam to visualize the uterus, endometrium, ovaries and adnexa . Color and duplex Doppler ultrasound was utilized to evaluate blood flow to the ovaries.  COMPARISON:  CT ABD/PELVIS W CM dated 07/10/2011  FINDINGS: Uterus  Measurements: 7.2 x 3.0 x 4.1 cm. No fibroids or other mass visualized.  Endometrium  Thickness: Normal thickness, 5 mm.  No focal abnormality visualized.  Right ovary  Measurements: 2.8 x 1.8 x 1.9 cm. Normal appearance/no adnexal mass. Multiple small follicles.  Left ovary  Measurements: 2.4  x 2.2 x 2.7 cm. Normal appearance/no adnexal mass. Multiple small follicles.  Other findings:  Small amount of free fluid in the pelvis.  Pulsed Doppler evaluation of both ovaries demonstrates normal low-resistance arterial and venous waveforms.  IMPRESSION:  Unremarkable pelvic ultrasound.  No evidence of ovarian torsion.   Electronically Signed   By: Charlett Nose M.D.   On: 03/12/2014 13:27   US Pelvis Complete  03/12/2014   CLINICAL DATA:  Pelvic pain  EXAM: TRANSABDOMINAL AND TRANSVAGINAL ULTRASOUND OF PELVIS  DOPPLER ULTRASOUND OF OVARIES  TECHNIQUE: Both transabdominal and transvaginal ultrasound examinations of the pelvis were performed. Transabdominal technique was performed for global imaging of the pelvis including uterus, ovaries, adnexal regions, and pelvic cul-de-sac.  It was necessary to proceed with endovaginal exam following the transabdominal exam to visualize the uterus, endometrium, ovaries and adnexa . Color and duplex Doppler ultrasound was utilized to evaluate blood flow to the ovaries.  COMPARISON:  CT ABD/PELVIS W CM dated 07/10/2011  FINDINGS: Uterus  Measurements: 7.2 x 3.0 x 4.1 cm. No fibroids or other mass visualized.  Endometrium  Thickness: Normal thickness, 5 mm.  No focal abnormality visualized.  Right ovary  Measurements: 2.8 x 1.8 x 1.9 cm. Normal appearance/no adnexal mass. Multiple small follicles.  Left ovary  Measurements: 2.4 x 2.2 x 2.7 cm. Normal appearance/no adnexal mass. Multiple small follicles.  Other findings:  Small amount of free fluid in the pelvis.  Pulsed Doppler evaluation of both ovaries demonstrates  normal low-resistance arterial and venous waveforms.  IMPRESSION:  Unremarkable pelvic ultrasound.  No evidence of ovarian torsion.   Electronically Signed   By: Charlett NoseKevin  Dover M.D.   On: 03/12/2014 13:27   Ct Abdomen Pelvis W Contrast  03/12/2014   CLINICAL DATA:  Right lower quadrant abdominal pain.  EXAM: CT ABDOMEN AND PELVIS WITH CONTRAST  TECHNIQUE:  Multidetector CT imaging of the abdomen and pelvis was performed using the standard protocol following bolus administration of intravenous contrast.  CONTRAST:  50mL OMNIPAQUE IOHEXOL 300 MG/ML SOLN, 80mL OMNIPAQUE IOHEXOL 300 MG/ML SOLN  COMPARISON:  CT scan 07/10/2011  FINDINGS: The lung bases are clear.  No pleural effusion.  The solid abdominal organs are normal. The gallbladder is normal. No common bowel duct dilatation.  The stomach and duodenum are unremarkable. The small bowel loops demonstrate mild inflammatory changes with edema in the mesenteric. The colon is unremarkable. I do not see the appendix for certain. The cecum is lying deep in the pelvis as is the terminal ileum. I do not see any definite findings for appendicitis. The uterus and ovaries appear normal. The bladder is normal. There is a small amount of free pelvic fluid.  The aorta is normal in caliber. The major branch vessels are normal. The major venous structures are patent. Small scattered mesenteric lymph nodes suggesting mesenteric adenitis.  IMPRESSION: 1. No definite CT findings for acute appendicitis. The appendix is not identified for certain. But I do not see any definite findings for appendicitis. 2. Suspect enteritis and mesenteric adenitis.   Electronically Signed   By: Loralie ChampagneMark  Gallerani M.D.   On: 03/12/2014 16:46   [place] Art/ven Flow Abd Pelv Doppler  03/12/2014   CLINICAL DATA:  Pelvic pain  EXAM: TRANSABDOMINAL AND TRANSVAGINAL ULTRASOUND OF PELVIS  DOPPLER ULTRASOUND OF OVARIES  TECHNIQUE: Both transabdominal and transvaginal ultrasound examinations of the pelvis were performed. Transabdominal technique was performed for global imaging of the pelvis including uterus, ovaries, adnexal regions, and pelvic cul-de-sac.  It was necessary to proceed with endovaginal exam following the transabdominal exam to visualize the uterus, endometrium, ovaries and adnexa . Color and duplex Doppler ultrasound was utilized to evaluate blood flow  to the ovaries.  COMPARISON:  CT ABD/PELVIS W CM dated 07/10/2011  FINDINGS: Uterus  Measurements: 7.2 x 3.0 x 4.1 cm. No fibroids or other mass visualized.  Endometrium  Thickness: Normal thickness, 5 mm.  No focal abnormality visualized.  Right ovary  Measurements: 2.8 x 1.8 x 1.9 cm. Normal appearance/no adnexal mass. Multiple small follicles.  Left ovary  Measurements: 2.4 x 2.2 x 2.7 cm. Normal appearance/no adnexal mass. Multiple small follicles.  Other findings:  Small amount of free fluid in the pelvis.  Pulsed Doppler evaluation of both ovaries demonstrates normal low-resistance arterial and venous waveforms.  IMPRESSION:  Unremarkable pelvic ultrasound.  No evidence of ovarian torsion.   Electronically Signed   By: Charlett NoseKevin  Dover M.D.   On: 03/12/2014 13:27    Assessment/Plan Principal Problem:  Abdominal pain with tachycardia secondary to Enteritis - Will admit to MedSurg, place on IV fluid hydration, antiemetics, pain control, patient is feeling somewhat better after antiemetics and pain medications in the ER - For now we'll place on clears only if she can tolerate, continue IV ciprofloxacin and Flagyl - Obtain GI pathogen panel  Active Problems:    Hypokalemia - place on K replacement   DVT prophylaxis: SCD's  CODE STATUS: full code  Family Communication: Admission, patients condition and plan  of care including tests being ordered have been discussed with the patient who indicates understanding and agree with the plan and Code Status   Further plan will depend as patient's clinical course evolves and further radiologic and laboratory data become available.   Time Spent on Admission: 1 hour  Ripudeep Jenna Luo M.D. [facility] 03/12/2014, 6:54 PM Pager: [phone]  If 7PM-7AM, please contact night-coverage [email or web address] Password [email or web address]

## 2014-03-12 NOTE — Progress Notes (Signed)
[facility] provided pt with a list of primary care resources and a [facility] application to help  Patient establish primary care.

## 2014-03-12 NOTE — ED Notes (Signed)
[place] MD notified of pt 100.6 temperature post tylenol administration and unchanged pain post medication intervention.

## 2014-03-12 NOTE — ED Notes (Signed)
Bed: WA23 Expected date:  Expected time:  Means of arrival:  Comments: EMS-abdominal pain 

## 2014-03-12 NOTE — ED Notes (Signed)
Pt ambulated to restroom to void

## 2014-03-12 NOTE — ED Provider Notes (Signed)
CSN: [id number]     Arrival date & time 03/12/14  1051 History   First MD Initiated Contact with Patient 03/12/14 1058     Chief Complaint  Patient presents with  . Abdominal Cramping     (Consider location/radiation/quality/duration/timing/severity/associated sxs/prior Treatment) Patient is a 23 y.o. female presenting with abdominal pain.  Abdominal Pain Pain location:  Suprapubic Pain quality: cramping   Pain radiates to:  Does not radiate Pain severity:  Severe Onset quality:  Gradual Duration:  3 days Timing:  Constant Progression:  Worsening Chronicity:  New Context comment:  LMP now Relieved by:  Nothing Worsened by:  Movement and palpation Ineffective treatments:  NSAIDs Associated symptoms: nausea, vaginal bleeding and vomiting   Associated symptoms: no constipation, no diarrhea, no dysuria and no fever     History reviewed. No pertinent past medical history. Past Surgical History  Procedure Laterality Date  . Cesarean section     Family History  Problem Relation Age of Onset  . Diabetes Sister   . Diabetes Maternal Aunt   . Hypertension Maternal Aunt   . Diabetes Maternal Uncle   . Hypertension Maternal Uncle    History  Substance Use Topics  . Smoking status: Current Every Day Smoker -- 1.00 packs/day    Types: Cigarettes  . Smokeless tobacco: Never Used  . Alcohol Use: 1.8 oz/week    3 Glasses of wine per week   OB History   Grav Para Term Preterm Abortions TAB SAB Ect Mult Living   1 1        1      Review of Systems  Constitutional: Negative for fever.  Gastrointestinal: Positive for nausea, vomiting and abdominal pain. Negative for diarrhea and constipation.  Genitourinary: Positive for vaginal bleeding. Negative for dysuria.  All other systems reviewed and are negative.     Allergies  Review of patient's allergies indicates no known allergies.  Home Medications   Current Outpatient Rx  Name  Route  Sig  Dispense  Refill  . ibuprofen  (ADVIL,MOTRIN) 200 MG tablet   Oral   Take 200 mg by mouth every 6 (six) hours as needed for cramping.          BP 112/63  Pulse 116  Temp(Src) 98.5 F (36.9 C) (Oral)  Resp 20  SpO2 100%  LMP 03/10/2014 Physical Exam  Nursing note and vitals reviewed. Constitutional: She is oriented to person, place, and time. She appears well-developed and well-nourished. She appears distressed (appears in pain).  HENT:  Head: Normocephalic and atraumatic.  Mouth/Throat: Oropharynx is clear and moist.  Eyes: Conjunctivae are normal. Pupils are equal, round, and reactive to light. No scleral icterus.  Neck: Neck supple.  Cardiovascular: Normal rate, regular rhythm, normal heart sounds and intact distal pulses.   No murmur heard. Pulmonary/Chest: Effort normal and breath sounds normal. No stridor. No respiratory distress. She has no rales.  Abdominal: Soft. Bowel sounds are normal. She exhibits no distension. There is generalized tenderness (Most significant and lower abdomen). There is guarding (Voluntary). There is no rigidity and no rebound.  Genitourinary: Vagina normal. There is no rash or tenderness on the right labia. There is no rash or tenderness on the left labia. Uterus is tender. Cervix exhibits motion tenderness and discharge (Scant white). Cervix exhibits no friability. Right adnexum displays tenderness. Right adnexum displays no mass. Left adnexum displays tenderness. Left adnexum displays no mass.  Exam limited by diffuse pain and guarding  Musculoskeletal: Normal range of motion.  Neurological: She is alert and oriented to person, place, and time.  Skin: Skin is warm and dry. No rash noted.  Psychiatric: She has a normal mood and affect. Her behavior is normal.    ED Course  Procedures (including critical care time) Labs Review Labs Reviewed  CBC WITH DIFFERENTIAL - Abnormal; Notable for the following:    WBC 22.5 (*)    MCHC 37.2 (*)    Neutrophils Relative % 95 (*)     Neutro Abs 21.4 (*)    Lymphocytes Relative 4 (*)    Monocytes Relative 1 (*)    All other components within normal limits  COMPREHENSIVE METABOLIC PANEL - Abnormal; Notable for the following:    Potassium 3.4 (*)    Glucose, Bld 102 (*)    GFR calc non Af Amer 82 (*)    All other components within normal limits  URINALYSIS, ROUTINE W REFLEX MICROSCOPIC - Abnormal; Notable for the following:    Color, Urine AMBER (*)    Bilirubin Urine SMALL (*)    Protein, ur 30 (*)    Leukocytes, UA SMALL (*)    All other components within normal limits  GC/CHLAMYDIA PROBE AMP  WET PREP, GENITAL  LIPASE, BLOOD  URINE MICROSCOPIC-ADD ON  POC URINE PREG, ED   Imaging Review No results found.   EKG Interpretation None      MDM  Clinical Impression: Abdominal pain  23 year old female presenting with abdominal pain. Tender diffusely, but most significantly in the suprapubic region and a left lower quadrant.  Found to be febrile during her ED course. Initial presentation concerning for possible ovarian torsion, but pelvic ultrasound negative. CT pending. IV Dilaudid has improved the pain somewhat.  Care transferred to Dr. Silverio LayYao pending CT.    Candyce ChurnJohn David Tondalaya Perren III, MD 03/13/14 [phone]

## 2014-03-12 NOTE — ED Provider Notes (Signed)
  Physical Exam  BP 96/72  Pulse 121  Temp(Src) 99.2 F (37.3 C) (Oral)  Resp 14  SpO2 96%  LMP 03/10/2014  Physical Exam  ED Course  Procedures  Care assumed at sign out from Dr. Charleston RopesWollford. Patient here with severe abdominal pain this AM. Initially thought torsion but US showed no torsion. WBC 22. Sign out pending CT. Ct showed enteritis and mesenteric adenitis. She was given cipro, flagyl. However, after 3 doses of pain meds, still in terrible pain. Will admit for observation.     Richardean Canalavid H Yao, MD 03/12/14 [phone]

## 2014-03-12 NOTE — ED Notes (Addendum)
Per EMS pt has had lower abdominal pain since yesterday 1900. Pt reports period starting 2 days ago, more severe than before with n/v.

## 2014-03-12 NOTE — ED Notes (Signed)
Pt denies pain at present time. Pt states "I have no pain but I feel tense."

## 2014-03-12 NOTE — ED Notes (Addendum)
Wofford MD notified of pt vitals check by Rhoney and myself. Order to continue to administer dilaudid in additional to tylenol. Will continue to monitor pt.

## 2014-03-12 NOTE — ED Notes (Addendum)
Pt reports urge to have BM. Pt ambulated to restroom to attempt urine sample as well.

## 2014-03-12 NOTE — ED Notes (Signed)
Pt waiting for CT of abdomen.

## 2014-03-13 LAB — BASIC METABOLIC PANEL
BUN: 7 mg/dL (ref 6–23)
CALCIUM: 7.4 mg/dL — AB (ref 8.4–10.5)
CO2: 22 meq/L (ref 19–32)
Chloride: 105 mEq/L (ref 96–112)
Creatinine, Ser: 0.86 mg/dL (ref 0.50–1.10)
GFR calc Af Amer: >90 mL/min (ref >90)
GFR calc non Af Amer: >90 mL/min (ref >90)
Glucose, Bld: 82 mg/dL (ref 70–99)
POTASSIUM: 3.8 meq/L (ref 3.7–5.3)
SODIUM: 140 meq/L (ref 137–147)

## 2014-03-13 LAB — CBC
HCT: 32.6 % — ABNORMAL LOW (ref 36.0–46.0)
HEMOGLOBIN: 11.4 g/dL — AB (ref 12.0–15.0)
MCH: 29.7 pg (ref 26.0–34.0)
MCHC: 35 g/dL (ref 30.0–36.0)
MCV: 84.9 fL (ref 78.0–100.0)
Platelets: 189 10*3/uL (ref 150–400)
RBC: 3.84 MIL/uL — AB (ref 3.87–5.11)
RDW: 15 % (ref 11.5–15.5)
WBC: 27.2 10*3/uL — ABNORMAL HIGH (ref 4.0–10.5)

## 2014-03-13 LAB — GC/CHLAMYDIA PROBE AMP
CT Probe RNA: POSITIVE — AB
GC PROBE AMP APTIMA: POSITIVE — AB

## 2014-03-13 MED ORDER — OXYCODONE HCL 5 MG PO TABS
5.0000 mg | ORAL_TABLET | Freq: Four times a day (QID) | ORAL | Status: DC | PRN
  Administered 2014-03-13 – 2014-03-15 (×3): 5 mg via ORAL
  Filled 2014-03-13 (×3): qty 1

## 2014-03-13 MED ORDER — HYDROMORPHONE HCL PF 1 MG/ML IJ SOLN
1.0000 mg | INTRAMUSCULAR | Status: DC | PRN
  Administered 2014-03-13 – 2014-03-14 (×2): 1 mg via INTRAVENOUS
  Administered 2014-03-14 (×3): 2 mg via INTRAVENOUS
  Filled 2014-03-13 (×5): qty 2

## 2014-03-13 NOTE — Progress Notes (Signed)
[facility] being relieved at all from PRN Dilaudid and Vicodin. Also reported off that patient states she has severe pain starting at her right lower quadrant of her abdomen and radiating across her lower abdomen. Received order for Oxy IR 5mg  Q6 hours. Will continue to monitor patient and her symptoms to make sure her condition does not worsen.

## 2014-03-13 NOTE — Progress Notes (Signed)
[facility] PROGRESS NOTE  Heather Lane:[id number] DOB: 1991-01-24 DOA: 2014-03-25 PCP: Pcp Not In System  Assessment/Plan: Principal Problem:   Enteritis - Ct of abdomen and pelvis reports: No definite CT findings for acute appendicitis. Suspect enteritis and mesenteric adenitis. - Despite worsening leukocytosis patient currently feels better continue Cipro and Flagyl - reassess cbc next am.  Active Problems:   Tachycardia - Resolved and likely due to discomfort and enteritis.    Abdominal pain - Secondary to principle problem - Continue supportive therapy and current abx regimen.    Hypokalemia - Resolved after potassium replacement.  Code Status: full Family Communication: None Disposition Plan: Pending continued improvement in condition   Consultants:  none  Procedures:  CT of abdomen and pelvis  Antibiotics:  Cipro and flagyl since admission  HPI/Subjective: No new complaints. Feels better currently but still has generalized abdominal discomfort.  Objective: Filed Vitals:   03/13/14 0415  BP: 101/65  Pulse: 91  Temp: 98.8 F (37.1 C)  Resp: 16    Intake/Output Summary (Last 24 hours) at 03/13/14 1118 Last data filed at 03/13/14 0600  Gross per 24 hour  Intake   2120 ml  Output    600 ml  Net   1520 ml   Filed Weights   03/25/14 2114  Weight: 58.2 kg (128 lb 4.9 oz)    Exam:   General:  Pt in NAD, alert and awake  Cardiovascular: RRR, no MRG  Respiratory: CTA BL, no wheezes  Abdomen: soft, generalized tenderness with palpation, hypoactive bowel sounds  Musculoskeletal: no cyanosis or clubbing   Data Reviewed: Basic Metabolic Panel:  Recent Labs Lab March 25, 2014 1135 03/13/14 0405  NA 139 140  K 3.4* 3.8  CL 101 105  CO2 24 22  GLUCOSE 102* 82  BUN 12 7  CREATININE 0.98 0.86  CALCIUM 9.2 7.4*   Liver Function Tests:  Recent Labs Lab 2014/03/25 1135  AST 15  ALT 21  ALKPHOS 49  BILITOT 1.2  PROT 7.1   ALBUMIN 4.1    Recent Labs Lab March 25, 2014 1135  LIPASE 16   No results found for this basename: AMMONIA,  in the last 168 hours CBC:  Recent Labs Lab 03-25-14 1135 03/13/14 0405  WBC 22.5* 27.2*  NEUTROABS 21.4*  --   HGB 14.4 11.4*  HCT 38.7 32.6*  MCV 82.9 84.9  PLT 229 189   Cardiac Enzymes: No results found for this basename: CKTOTAL, CKMB, CKMBINDEX, TROPONINI,  in the last 168 hours BNP (last 3 results) No results found for this basename: PROBNP,  in the last 8760 hours CBG: No results found for this basename: GLUCAP,  in the last 168 hours  Recent Results (from the past 240 hour(s))  WET PREP, GENITAL     Status: Abnormal   Collection Time    March 25, 2014 12:26 PM      Result Value Ref Range Status   Yeast Wet Prep HPF POC NONE SEEN  NONE SEEN Final   Trich, Wet Prep NONE SEEN  NONE SEEN Final   Clue Cells Wet Prep HPF POC FEW (*) NONE SEEN Final   WBC, Wet Prep HPF POC FEW (*) NONE SEEN Final     Studies: US Transvaginal Non-ob  March 25, 2014   CLINICAL DATA:  Pelvic pain  EXAM: TRANSABDOMINAL AND TRANSVAGINAL ULTRASOUND OF PELVIS  DOPPLER ULTRASOUND OF OVARIES  TECHNIQUE: Both transabdominal and transvaginal ultrasound examinations of the pelvis were performed. Transabdominal technique was performed for global imaging of the  pelvis including uterus, ovaries, adnexal regions, and pelvic cul-de-sac.  It was necessary to proceed with endovaginal exam following the transabdominal exam to visualize the uterus, endometrium, ovaries and adnexa . Color and duplex Doppler ultrasound was utilized to evaluate blood flow to the ovaries.  COMPARISON:  CT ABD/PELVIS W CM dated 07/10/2011  FINDINGS: Uterus  Measurements: 7.2 x 3.0 x 4.1 cm. No fibroids or other mass visualized.  Endometrium  Thickness: Normal thickness, 5 mm.  No focal abnormality visualized.  Right ovary  Measurements: 2.8 x 1.8 x 1.9 cm. Normal appearance/no adnexal mass. Multiple small follicles.  Left ovary   Measurements: 2.4 x 2.2 x 2.7 cm. Normal appearance/no adnexal mass. Multiple small follicles.  Other findings:  Small amount of free fluid in the pelvis.  Pulsed Doppler evaluation of both ovaries demonstrates normal low-resistance arterial and venous waveforms.  IMPRESSION:  Unremarkable pelvic ultrasound.  No evidence of ovarian torsion.   Electronically Signed   By: Charlett NoseKevin  Dover M.D.   On: 03/12/2014 13:27   [place] Pelvis Complete  03/12/2014   CLINICAL DATA:  Pelvic pain  EXAM: TRANSABDOMINAL AND TRANSVAGINAL ULTRASOUND OF PELVIS  DOPPLER ULTRASOUND OF OVARIES  TECHNIQUE: Both transabdominal and transvaginal ultrasound examinations of the pelvis were performed. Transabdominal technique was performed for global imaging of the pelvis including uterus, ovaries, adnexal regions, and pelvic cul-de-sac.  It was necessary to proceed with endovaginal exam following the transabdominal exam to visualize the uterus, endometrium, ovaries and adnexa . Color and duplex Doppler ultrasound was utilized to evaluate blood flow to the ovaries.  COMPARISON:  CT ABD/PELVIS W CM dated 07/10/2011  FINDINGS: Uterus  Measurements: 7.2 x 3.0 x 4.1 cm. No fibroids or other mass visualized.  Endometrium  Thickness: Normal thickness, 5 mm.  No focal abnormality visualized.  Right ovary  Measurements: 2.8 x 1.8 x 1.9 cm. Normal appearance/no adnexal mass. Multiple small follicles.  Left ovary  Measurements: 2.4 x 2.2 x 2.7 cm. Normal appearance/no adnexal mass. Multiple small follicles.  Other findings:  Small amount of free fluid in the pelvis.  Pulsed Doppler evaluation of both ovaries demonstrates normal low-resistance arterial and venous waveforms.  IMPRESSION:  Unremarkable pelvic ultrasound.  No evidence of ovarian torsion.   Electronically Signed   By: Charlett NoseKevin  Dover M.D.   On: 03/12/2014 13:27   Ct Abdomen Pelvis W Contrast  03/12/2014   CLINICAL DATA:  Right lower quadrant abdominal pain.  EXAM: CT ABDOMEN AND PELVIS WITH CONTRAST   TECHNIQUE: Multidetector CT imaging of the abdomen and pelvis was performed using the standard protocol following bolus administration of intravenous contrast.  CONTRAST:  50mL OMNIPAQUE IOHEXOL 300 MG/ML SOLN, 80mL OMNIPAQUE IOHEXOL 300 MG/ML SOLN  COMPARISON:  CT scan 07/10/2011  FINDINGS: The lung bases are clear.  No pleural effusion.  The solid abdominal organs are normal. The gallbladder is normal. No common bowel duct dilatation.  The stomach and duodenum are unremarkable. The small bowel loops demonstrate mild inflammatory changes with edema in the mesenteric. The colon is unremarkable. I do not see the appendix for certain. The cecum is lying deep in the pelvis as is the terminal ileum. I do not see any definite findings for appendicitis. The uterus and ovaries appear normal. The bladder is normal. There is a small amount of free pelvic fluid.  The aorta is normal in caliber. The major branch vessels are normal. The major venous structures are patent. Small scattered mesenteric lymph nodes suggesting mesenteric adenitis.  IMPRESSION: 1. No  definite CT findings for acute appendicitis. The appendix is not identified for certain. But I do not see any definite findings for appendicitis. 2. Suspect enteritis and mesenteric adenitis.   Electronically Signed   By: Loralie Champagne M.D.   On: 03/12/2014 16:46   [place] Art/ven Flow Abd Pelv Doppler  03/12/2014   CLINICAL DATA:  Pelvic pain  EXAM: TRANSABDOMINAL AND TRANSVAGINAL ULTRASOUND OF PELVIS  DOPPLER ULTRASOUND OF OVARIES  TECHNIQUE: Both transabdominal and transvaginal ultrasound examinations of the pelvis were performed. Transabdominal technique was performed for global imaging of the pelvis including uterus, ovaries, adnexal regions, and pelvic cul-de-sac.  It was necessary to proceed with endovaginal exam following the transabdominal exam to visualize the uterus, endometrium, ovaries and adnexa . Color and duplex Doppler ultrasound was utilized to evaluate  blood flow to the ovaries.  COMPARISON:  CT ABD/PELVIS W CM dated 07/10/2011  FINDINGS: Uterus  Measurements: 7.2 x 3.0 x 4.1 cm. No fibroids or other mass visualized.  Endometrium  Thickness: Normal thickness, 5 mm.  No focal abnormality visualized.  Right ovary  Measurements: 2.8 x 1.8 x 1.9 cm. Normal appearance/no adnexal mass. Multiple small follicles.  Left ovary  Measurements: 2.4 x 2.2 x 2.7 cm. Normal appearance/no adnexal mass. Multiple small follicles.  Other findings:  Small amount of free fluid in the pelvis.  Pulsed Doppler evaluation of both ovaries demonstrates normal low-resistance arterial and venous waveforms.  IMPRESSION:  Unremarkable pelvic ultrasound.  No evidence of ovarian torsion.   Electronically Signed   By: Charlett Nose M.D.   On: 03/12/2014 13:27    Scheduled Meds: . ciprofloxacin  400 mg Intravenous Q12H  . metronidazole  500 mg Intravenous Q8H   Continuous Infusions: . sodium chloride       Time spent: > 35 minutes    Heather Lane  [facility] Pager [phone] If 7PM-7AM, please contact night-coverage at [email or web address], password [facility] 03/13/2014, 11:18 AM  LOS: 1 day

## 2014-03-14 DIAGNOSIS — A549 Gonococcal infection, unspecified: Secondary | ICD-10-CM

## 2014-03-14 DIAGNOSIS — N730 Acute parametritis and pelvic cellulitis: Secondary | ICD-10-CM

## 2014-03-14 LAB — URINE CULTURE

## 2014-03-14 LAB — CBC
HEMATOCRIT: 34.9 % — AB (ref 36.0–46.0)
Hemoglobin: 12.5 g/dL (ref 12.0–15.0)
MCH: 30.2 pg (ref 26.0–34.0)
MCHC: 35.8 g/dL (ref 30.0–36.0)
MCV: 84.3 fL (ref 78.0–100.0)
Platelets: 193 10*3/uL (ref 150–400)
RBC: 4.14 MIL/uL (ref 3.87–5.11)
RDW: 14.7 % (ref 11.5–15.5)
WBC: 22.4 10*3/uL — ABNORMAL HIGH (ref 4.0–10.5)

## 2014-03-14 MED ORDER — DEXTROSE 5 % IV SOLN
1.0000 g | Freq: Once | INTRAVENOUS | Status: AC
  Administered 2014-03-14: 1 g via INTRAVENOUS
  Filled 2014-03-14: qty 10

## 2014-03-14 MED ORDER — DIPHENHYDRAMINE HCL 25 MG PO CAPS
25.0000 mg | ORAL_CAPSULE | Freq: Four times a day (QID) | ORAL | Status: DC | PRN
  Administered 2014-03-14 (×2): 25 mg via ORAL
  Filled 2014-03-14 (×2): qty 1

## 2014-03-14 MED ORDER — BOOST / RESOURCE BREEZE PO LIQD
1.0000 | Freq: Three times a day (TID) | ORAL | Status: DC
  Administered 2014-03-14 – 2014-03-15 (×3): 1 via ORAL

## 2014-03-14 MED ORDER — DOXYCYCLINE HYCLATE 100 MG PO TABS
100.0000 mg | ORAL_TABLET | Freq: Two times a day (BID) | ORAL | Status: DC
  Administered 2014-03-14 – 2014-03-15 (×3): 100 mg via ORAL
  Filled 2014-03-14 (×4): qty 1

## 2014-03-14 NOTE — Progress Notes (Signed)
INITIAL NUTRITION ASSESSMENT  DOCUMENTATION CODES Per approved criteria  -Not Applicable   INTERVENTION: - Resource Breeze TID, each provides 250 kcal, 9 g protein.  - Order bland, easy to digest foods off menu to improve intake.   NUTRITION DIAGNOSIS: Inadequate oral intake related to enteritis as evidenced by 20% intake of meals.   Goal: Patient will meet >/=90% of estimated nutrition needs  Monitor:  PO intake, weight, labs  Reason for Assessment: Malnutrition screening tool  23 y.o. female  Admitting Dx: Enteritis  ASSESSMENT: 23 year old female patient with no past medical history, presented to the ER with severe abdominal pain for the last 4 days. She has had 1 episode of nausea and vomiting and watery diarrhea. CT scan did not show any acute appendicitis, but did show enteritis.   Patient reports that she has not been able to eat much for the last 4 days. She attributes enteritis to eating "bad chicken" at a picnic. She still complains of abdominal pain. Diet advanced today to regular. Patient reports that she does not know which foods she will tolerate, so we discussed menu options. She is open to trying nutritional supplements. No weight loss reported.   Height: Ht Readings from Last 1 Encounters:  03/12/14 5\' 4"  (1.626 m)    Weight: Wt Readings from Last 1 Encounters:  03/12/14 128 lb 4.9 oz (58.2 kg)    Ideal Body Weight: 120 pounds  % Ideal Body Weight: 167%  Wt Readings from Last 10 Encounters:  03/12/14 128 lb 4.9 oz (58.2 kg)  01/27/13 124 lb (56.246 kg)  10/23/12 131 lb (59.421 kg) (55%*, Z = 0.12)   * Growth percentiles are based on CDC 2-20 Years data.    Usual Body Weight: 125 pounds  % Usual Body Weight: 102%  BMI:  Body mass index is 22.01 kg/(m^2). Patient is normal weight.   Estimated Nutritional Needs: Kcal: 1650-1750 kcal Protein: 70-80 g Fluid: >2.0 L/day  Skin: intact  Diet Order: General  EDUCATION NEEDS: -Education needs  addressed   Intake/Output Summary (Last 24 hours) at 03/14/14 1433 Last data filed at 03/14/14 0900  Gross per 24 hour  Intake   1060 ml  Output   3900 ml  Net  -2840 ml    Last BM: PTA   Labs:   Recent Labs Lab 03/12/14 1135 03/13/14 0405  NA 139 140  K 3.4* 3.8  CL 101 105  CO2 24 22  BUN 12 7  CREATININE 0.98 0.86  CALCIUM 9.2 7.4*  GLUCOSE 102* 82    CBG (last 3)  No results found for this basename: GLUCAP,  in the last 72 hours  Scheduled Meds: . cefTRIAXone (ROCEPHIN)  IV  1 g Intravenous Once  . doxycycline  100 mg Oral Q12H    Continuous Infusions:   History reviewed. No pertinent past medical history.  Past Surgical History  Procedure Laterality Date  . Cesarean section      Linnell FullingElyse Altheria Shadoan, RD, LDN Pager #: [phone] After-Hours Pager #: [phone]

## 2014-03-14 NOTE — Progress Notes (Signed)
[facility] PROGRESS NOTE  Doral Digangi RUE:[id number] DOB: Jun 05, 1991 DOA: 03/12/2014 PCP: Pcp Not In System  Assessment/Plan: Abdominal Pain/PID -Not convinced of bacterial enteritis per CT results. -Patient's pain is mostly located in her pelvic area and her GC and CT probe were positive. -Suspect she has early PID: transvaginal US without abscess or ovarian torsion. -DC cipro/flagyl and start on doxy for 7 days plus 1 dose of IV rocephin. -Advance diet and hopeful for DC home in am.  Code Status: Full Code Family Communication: Patient only  Disposition Plan: Home when ready; likely 24-48 hours.   Consultants:  None   Antibiotics:  Rocephin  Doxycycline   Subjective: C/o pelvic pain.  Objective: Filed Vitals:   03/13/14 1325 03/13/14 2039 03/14/14 0529 03/14/14 1400  BP: 112/74 111/68 110/59 110/63  Pulse: 100 95 110 108  Temp: 98.2 F (36.8 C) 99.8 F (37.7 C) 98.4 F (36.9 C) 97.5 F (36.4 C)  TempSrc: Oral Oral Oral Oral  Resp: 16 16 16 16   Height:      Weight:      SpO2: 100% 94% 92% 97%    Intake/Output Summary (Last 24 hours) at 03/14/14 1532 Last data filed at 03/14/14 1230  Gross per 24 hour  Intake   1060 ml  Output   3900 ml  Net  -2840 ml   Filed Weights   03/12/14 2114  Weight: 58.2 kg (128 lb 4.9 oz)    Exam:   General:  AA Ox3  Cardiovascular: RRR  Respiratory: CTA B  Abdomen: S/ND/+BS/TTP RLQ, LLQ and suprapubic areas  Extremities: no C/C/E   Neurologic:  Non-focal.  Data Reviewed: Basic Metabolic Panel:  Recent Labs Lab 03/12/14 1135 03/13/14 0405  NA 139 140  K 3.4* 3.8  CL 101 105  CO2 24 22  GLUCOSE 102* 82  BUN 12 7  CREATININE 0.98 0.86  CALCIUM 9.2 7.4*   Liver Function Tests:  Recent Labs Lab 03/12/14 1135  AST 15  ALT 21  ALKPHOS 49  BILITOT 1.2  PROT 7.1  ALBUMIN 4.1    Recent Labs Lab 03/12/14 1135  LIPASE 16   No results found for this basename: AMMONIA,  in the  last 168 hours CBC:  Recent Labs Lab 03/12/14 1135 03/13/14 0405 03/14/14 0435  WBC 22.5* 27.2* 22.4*  NEUTROABS 21.4*  --   --   HGB 14.4 11.4* 12.5  HCT 38.7 32.6* 34.9*  MCV 82.9 84.9 84.3  PLT 229 189 193   Cardiac Enzymes: No results found for this basename: CKTOTAL, CKMB, CKMBINDEX, TROPONINI,  in the last 168 hours BNP (last 3 results) No results found for this basename: PROBNP,  in the last 8760 hours CBG: No results found for this basename: GLUCAP,  in the last 168 hours  Recent Results (from the past 240 hour(s))  GC/CHLAMYDIA PROBE AMP     Status: Abnormal   Collection Time    03/12/14 12:26 PM      Result Value Ref Range Status   CT Probe RNA POSITIVE (*) NEGATIVE Final   Comment: (NOTE)     A Positive CT or NG Nucleic Acid Amplification Test (NAAT) result     should be considered presumptive evidence of infection.  The result     should be evaluated along with physical examination and other     diagnostic findings.   GC Probe RNA POSITIVE (*) NEGATIVE Final   Comment: (NOTE)     A Positive CT  or NG Nucleic Acid Amplification Test (NAAT) result     should be considered presumptive evidence of infection.  The result     should be evaluated along with physical examination and other     diagnostic findings.                                                                                               **Normal Reference Range: Negative**          Assay performed using the Gen-Probe APTIMA COMBO2 (R) Assay.     Acceptable specimen types for this assay include APTIMA Swabs (Unisex,     endocervical, urethral, or vaginal), first void urine, and ThinPrep     liquid based cytology samples.     [facility]formed at [facility]  WET PREP, GENITAL     Status: Abnormal   Collection Time    03/12/14 12:26 PM      Result Value Ref Range Status   Yeast Wet Prep HPF POC NONE SEEN  NONE SEEN Final   Trich, Wet Prep NONE SEEN  NONE SEEN Final   Clue Cells Wet Prep HPF POC  FEW (*) NONE SEEN Final   WBC, Wet Prep HPF POC FEW (*) NONE SEEN Final  URINE CULTURE     Status: None   Collection Time    03/12/14  6:58 PM      Result Value Ref Range Status   Specimen Description URINE, CLEAN CATCH   Final   Special Requests NONE   Final   Culture  Setup Time     Final   Value: 03/13/2014 01:16     Performed at [facility]   Colony Count     Final   Value: 20,OOO COLONIES/ML     Performed at [facility]   Culture     Final   Value: Multiple bacterial morphotypes present, none predominant. Suggest appropriate recollection if clinically indicated.     Performed at [facility]   Report Status 03/14/2014 FINAL   Final     Studies: Ct Abdomen Pelvis W Contrast  03/12/2014   CLINICAL DATA:  Right lower quadrant abdominal pain.  EXAM: CT ABDOMEN AND PELVIS WITH CONTRAST  TECHNIQUE: Multidetector CT imaging of the abdomen and pelvis was performed using the standard protocol following bolus administration of intravenous contrast.  CONTRAST:  50mL OMNIPAQUE IOHEXOL 300 MG/ML SOLN, 80mL OMNIPAQUE IOHEXOL 300 MG/ML SOLN  COMPARISON:  CT scan 07/10/2011  FINDINGS: The lung bases are clear.  No pleural effusion.  The solid abdominal organs are normal. The gallbladder is normal. No common bowel duct dilatation.  The stomach and duodenum are unremarkable. The small bowel loops demonstrate mild inflammatory changes with edema in the mesenteric. The colon is unremarkable. I do not see the appendix for certain. The cecum is lying deep in the pelvis as is the terminal ileum. I do not see any definite findings for appendicitis. The uterus and ovaries appear normal. The bladder is normal. There is a small amount of free pelvic fluid.  The aorta is normal in caliber. The major  branch vessels are normal. The major venous structures are patent. Small scattered mesenteric lymph nodes suggesting mesenteric adenitis.  IMPRESSION: 1. No definite CT findings for acute  appendicitis. The appendix is not identified for certain. But I do not see any definite findings for appendicitis. 2. Suspect enteritis and mesenteric adenitis.   Electronically Signed   By: Loralie Champagne M.D.   On: 03/12/2014 16:46    Scheduled Meds: . doxycycline  100 mg Oral Q12H  . feeding supplement (RESOURCE BREEZE)  1 Container Oral TID BM   Continuous Infusions:   Principal Problem:   Enteritis Active Problems:   Tachycardia   Abdominal pain   Hypokalemia   PID (acute pelvic inflammatory disease)    Time spent: 35 minutes. Greater than 50% of this time was spent in direct contact with the patient coordinating care.    Henderson Cloud  [facility] Pager [phone]  If 7PM-7AM, please contact night-coverage at [email or web address], password [facility] 03/14/2014, 3:32 PM  LOS: 2 days

## 2014-03-15 LAB — CBC
HCT: 31.7 % — ABNORMAL LOW (ref 36.0–46.0)
Hemoglobin: 11.6 g/dL — ABNORMAL LOW (ref 12.0–15.0)
MCH: 30.4 pg (ref 26.0–34.0)
MCHC: 36.6 g/dL — AB (ref 30.0–36.0)
MCV: 83 fL (ref 78.0–100.0)
Platelets: 208 10*3/uL (ref 150–400)
RBC: 3.82 MIL/uL — ABNORMAL LOW (ref 3.87–5.11)
RDW: 14.7 % (ref 11.5–15.5)
WBC: 19.2 10*3/uL — ABNORMAL HIGH (ref 4.0–10.5)

## 2014-03-15 LAB — BASIC METABOLIC PANEL
BUN: 5 mg/dL — AB (ref 6–23)
CO2: 22 mEq/L (ref 19–32)
CREATININE: 0.75 mg/dL (ref 0.50–1.10)
Calcium: 8.6 mg/dL (ref 8.4–10.5)
Chloride: 103 mEq/L (ref 96–112)
Glucose, Bld: 78 mg/dL (ref 70–99)
Potassium: 3.3 mEq/L — ABNORMAL LOW (ref 3.7–5.3)
Sodium: 140 mEq/L (ref 137–147)

## 2014-03-15 MED ORDER — ONDANSETRON HCL 4 MG PO TABS: 4.0000 mg | ORAL_TABLET | Freq: Four times a day (QID) | ORAL | Status: DC | PRN

## 2014-03-15 MED ORDER — DOXYCYCLINE HYCLATE 100 MG PO TABS: 100.0000 mg | ORAL_TABLET | Freq: Two times a day (BID) | ORAL | Status: DC

## 2014-03-15 MED ORDER — HYDROCODONE-ACETAMINOPHEN 5-325 MG PO TABS: 1.0000 | ORAL_TABLET | ORAL | Status: DC | PRN

## 2014-03-15 NOTE — Discharge Summary (Signed)
Physician Discharge Summary  Heather Lane [id number] DOB: [birth year] DOA: 03/12/2014  PCP: Pcp Not In System  Admit date: 03/12/2014 Discharge date: 03/15/2014  Time spent: 45 minutes  Recommendations for Outpatient Follow-up:  -Will be discharged home today. -Has 6 days of doxycycline remaining.   Discharge Diagnoses:  Principal Problem:   Enteritis Active Problems:   Tachycardia   Abdominal pain   Hypokalemia   PID (acute pelvic inflammatory disease)   Discharge Condition: Stable and improved  Filed Weights   03/12/14 2114  Weight: 58.2 kg (128 lb 4.9 oz)    History of present illness:  Patient is a 23 year old female with no certain past medical history who presented to the ER with severe abdominal pain, diffuse worse since yesterday. Patient reports that she started having crampy abdominal pain on Monday, 4 days ago coinciding with her menstrual cycle. Yesterday the pain became worse around 7 PM, severe, crampy, diffuse, 9/10, patient tried heating pad at home with no significant improvement. This morning patient had one episode of nausea and vomiting and watery diarrhea. As the pain was worsening, patient is in to the ER for further workup.  ER workup showed white count of 22.5, potassium 3.4 abdominal and pelvic ultrasound did not show any ovarian torsion. CT abdomen and pelvis did not show any acute appendicitis but did show entritis with mesenteric adenitis. Hospitalist admission was requested.     Hospital Course:   Abdominal Pain/PID  -Not convinced of bacterial enteritis per CT results.  -Patient's pain is mostly located in her pelvic area and her GC and CT probe were positive.  -Suspect she has early PID: transvaginal US without abscess or ovarian torsion.  -DC cipro/flagyl and start on doxy for 7 days plus 1 dose of IV rocephin. (has 6 days of doxy remaining on DC). -Tolerating soft diet without issues.   Procedures:  None    Consultations:  None  Discharge Instructions  Discharge Orders   Future Orders Complete By Expires   Discontinue IV  As directed    Increase activity slowly  As directed        Medication List         doxycycline 100 MG tablet  Commonly known as:  VIBRA-TABS  Take 1 tablet (100 mg total) by mouth every 12 (twelve) hours. For 6 days     HYDROcodone-acetaminophen 5-325 MG per tablet  Commonly known as:  NORCO/VICODIN  Take 1-2 tablets by mouth every 4 (four) hours as needed for moderate pain.     ibuprofen 200 MG tablet  Commonly known as:  ADVIL,MOTRIN  Take 200 mg by mouth every 6 (six) hours as needed for cramping.     ondansetron 4 MG tablet  Commonly known as:  ZOFRAN  Take 1 tablet (4 mg total) by mouth every 6 (six) hours as needed for nausea.       No Known Allergies     Follow-up Information   Follow up with Pcp Not In System. Schedule an appointment as soon as possible for a visit in 1 week.       The results of significant diagnostics from this hospitalization (including imaging, microbiology, ancillary and laboratory) are listed below for reference.    Significant Diagnostic Studies: [place] Transvaginal Non-ob  03/12/2014   CLINICAL DATA:  Pelvic pain  EXAM: TRANSABDOMINAL AND TRANSVAGINAL ULTRASOUND OF PELVIS  DOPPLER ULTRASOUND OF OVARIES  TECHNIQUE: Both transabdominal and transvaginal ultrasound examinations of the pelvis were performed. Transabdominal technique was  performed for global imaging of the pelvis including uterus, ovaries, adnexal regions, and pelvic cul-de-sac.  It was necessary to proceed with endovaginal exam following the transabdominal exam to visualize the uterus, endometrium, ovaries and adnexa . Color and duplex Doppler ultrasound was utilized to evaluate blood flow to the ovaries.  COMPARISON:  CT ABD/PELVIS W CM dated 07/10/2011  FINDINGS: Uterus  Measurements: 7.2 x 3.0 x 4.1 cm. No fibroids or other mass visualized.  Endometrium   Thickness: Normal thickness, 5 mm.  No focal abnormality visualized.  Right ovary  Measurements: 2.8 x 1.8 x 1.9 cm. Normal appearance/no adnexal mass. Multiple small follicles.  Left ovary  Measurements: 2.4 x 2.2 x 2.7 cm. Normal appearance/no adnexal mass. Multiple small follicles.  Other findings:  Small amount of free fluid in the pelvis.  Pulsed Doppler evaluation of both ovaries demonstrates normal low-resistance arterial and venous waveforms.  IMPRESSION:  Unremarkable pelvic ultrasound.  No evidence of ovarian torsion.   Electronically Signed   By: Charlett Nose M.D.   On: 03/12/2014 13:27   US Pelvis Complete  03/12/2014   CLINICAL DATA:  Pelvic pain  EXAM: TRANSABDOMINAL AND TRANSVAGINAL ULTRASOUND OF PELVIS  DOPPLER ULTRASOUND OF OVARIES  TECHNIQUE: Both transabdominal and transvaginal ultrasound examinations of the pelvis were performed. Transabdominal technique was performed for global imaging of the pelvis including uterus, ovaries, adnexal regions, and pelvic cul-de-sac.  It was necessary to proceed with endovaginal exam following the transabdominal exam to visualize the uterus, endometrium, ovaries and adnexa . Color and duplex Doppler ultrasound was utilized to evaluate blood flow to the ovaries.  COMPARISON:  CT ABD/PELVIS W CM dated 07/10/2011  FINDINGS: Uterus  Measurements: 7.2 x 3.0 x 4.1 cm. No fibroids or other mass visualized.  Endometrium  Thickness: Normal thickness, 5 mm.  No focal abnormality visualized.  Right ovary  Measurements: 2.8 x 1.8 x 1.9 cm. Normal appearance/no adnexal mass. Multiple small follicles.  Left ovary  Measurements: 2.4 x 2.2 x 2.7 cm. Normal appearance/no adnexal mass. Multiple small follicles.  Other findings:  Small amount of free fluid in the pelvis.  Pulsed Doppler evaluation of both ovaries demonstrates normal low-resistance arterial and venous waveforms.  IMPRESSION:  Unremarkable pelvic ultrasound.  No evidence of ovarian torsion.   Electronically Signed    By: Charlett Nose M.D.   On: 03/12/2014 13:27   Ct Abdomen Pelvis W Contrast  03/12/2014   CLINICAL DATA:  Right lower quadrant abdominal pain.  EXAM: CT ABDOMEN AND PELVIS WITH CONTRAST  TECHNIQUE: Multidetector CT imaging of the abdomen and pelvis was performed using the standard protocol following bolus administration of intravenous contrast.  CONTRAST:  50mL OMNIPAQUE IOHEXOL 300 MG/ML SOLN, 80mL OMNIPAQUE IOHEXOL 300 MG/ML SOLN  COMPARISON:  CT scan 07/10/2011  FINDINGS: The lung bases are clear.  No pleural effusion.  The solid abdominal organs are normal. The gallbladder is normal. No common bowel duct dilatation.  The stomach and duodenum are unremarkable. The small bowel loops demonstrate mild inflammatory changes with edema in the mesenteric. The colon is unremarkable. I do not see the appendix for certain. The cecum is lying deep in the pelvis as is the terminal ileum. I do not see any definite findings for appendicitis. The uterus and ovaries appear normal. The bladder is normal. There is a small amount of free pelvic fluid.  The aorta is normal in caliber. The major branch vessels are normal. The major venous structures are patent. Small scattered mesenteric lymph nodes suggesting  mesenteric adenitis.  IMPRESSION: 1. No definite CT findings for acute appendicitis. The appendix is not identified for certain. But I do not see any definite findings for appendicitis. 2. Suspect enteritis and mesenteric adenitis.   Electronically Signed   By: Loralie Champagne M.D.   On: 03/12/2014 16:46   [place] Art/ven Flow Abd Pelv Doppler  03/12/2014   CLINICAL DATA:  Pelvic pain  EXAM: TRANSABDOMINAL AND TRANSVAGINAL ULTRASOUND OF PELVIS  DOPPLER ULTRASOUND OF OVARIES  TECHNIQUE: Both transabdominal and transvaginal ultrasound examinations of the pelvis were performed. Transabdominal technique was performed for global imaging of the pelvis including uterus, ovaries, adnexal regions, and pelvic cul-de-sac.  It was  necessary to proceed with endovaginal exam following the transabdominal exam to visualize the uterus, endometrium, ovaries and adnexa . Color and duplex Doppler ultrasound was utilized to evaluate blood flow to the ovaries.  COMPARISON:  CT ABD/PELVIS W CM dated 07/10/2011  FINDINGS: Uterus  Measurements: 7.2 x 3.0 x 4.1 cm. No fibroids or other mass visualized.  Endometrium  Thickness: Normal thickness, 5 mm.  No focal abnormality visualized.  Right ovary  Measurements: 2.8 x 1.8 x 1.9 cm. Normal appearance/no adnexal mass. Multiple small follicles.  Left ovary  Measurements: 2.4 x 2.2 x 2.7 cm. Normal appearance/no adnexal mass. Multiple small follicles.  Other findings:  Small amount of free fluid in the pelvis.  Pulsed Doppler evaluation of both ovaries demonstrates normal low-resistance arterial and venous waveforms.  IMPRESSION:  Unremarkable pelvic ultrasound.  No evidence of ovarian torsion.   Electronically Signed   By: Charlett Nose M.D.   On: 03/12/2014 13:27    Microbiology: Recent Results (from the past 240 hour(s))  GC/CHLAMYDIA PROBE AMP     Status: Abnormal   Collection Time    03/12/14 12:26 PM      Result Value Ref Range Status   CT Probe RNA POSITIVE (*) NEGATIVE Final   Comment: (NOTE)     A Positive CT or NG Nucleic Acid Amplification Test (NAAT) result     should be considered presumptive evidence of infection.  The result     should be evaluated along with physical examination and other     diagnostic findings.   GC Probe RNA POSITIVE (*) NEGATIVE Final   Comment: (NOTE)     A Positive CT or NG Nucleic Acid Amplification Test (NAAT) result     should be considered presumptive evidence of infection.  The result     should be evaluated along with physical examination and other     diagnostic findings.                                                                                               **Normal Reference Range: Negative**          Assay performed using the Gen-Probe  APTIMA COMBO2 (R) Assay.     Acceptable specimen types for this assay include APTIMA Swabs (Unisex,     endocervical, urethral, or vaginal), first void urine, and ThinPrep     liquid based cytology samples.     Performed at [facility]  WET PREP, GENITAL     Status: Abnormal   Collection Time    03/12/14 12:26 PM      Result Value Ref Range Status   Yeast Wet Prep HPF POC NONE SEEN  NONE SEEN Final   Trich, Wet Prep NONE SEEN  NONE SEEN Final   Clue Cells Wet Prep HPF POC FEW (*) NONE SEEN Final   WBC, Wet Prep HPF POC FEW (*) NONE SEEN Final  URINE CULTURE     Status: None   Collection Time    03/12/14  6:58 PM      Result Value Ref Range Status   Specimen Description URINE, CLEAN CATCH   Final   Special Requests NONE   Final   Culture  Setup Time     Final   Value: 03/13/2014 01:16     Performed at [facility]   Colony Count     Final   Value: 20,OOO COLONIES/ML     Performed at [facility]   Culture     Final   Value: Multiple bacterial morphotypes present, none predominant. Suggest appropriate recollection if clinically indicated.     Performed at [facility]   Report Status 03/14/2014 FINAL   Final     Labs: Basic Metabolic Panel:  Recent Labs Lab 03/12/14 1135 03/13/14 0405 03/15/14 0337  NA 139 140 140  K 3.4* 3.8 3.3*  CL 101 105 103  CO2 24 22 22   GLUCOSE 102* 82 78  BUN 12 7 5*  CREATININE 0.98 0.86 0.75  CALCIUM 9.2 7.4* 8.6   Liver Function Tests:  Recent Labs Lab 03/12/14 1135  AST 15  ALT 21  ALKPHOS 49  BILITOT 1.2  PROT 7.1  ALBUMIN 4.1    Recent Labs Lab 03/12/14 1135  LIPASE 16   No results found for this basename: AMMONIA,  in the last 168 hours CBC:  Recent Labs Lab 03/12/14 1135 03/13/14 0405 03/14/14 0435 03/15/14 0337  WBC 22.5* 27.2* 22.4* 19.2*  NEUTROABS 21.4*  --   --   --   HGB 14.4 11.4* 12.5 11.6*  HCT 38.7 32.6* 34.9* 31.7*  MCV 82.9 84.9 84.3 83.0  PLT 229 189 193 208    Cardiac Enzymes: No results found for this basename: CKTOTAL, CKMB, CKMBINDEX, TROPONINI,  in the last 168 hours BNP: BNP (last 3 results) No results found for this basename: PROBNP,  in the last 8760 hours CBG: No results found for this basename: GLUCAP,  in the last 168 hours     Signed:  Henderson CloudEstela Y Hernandez Acosta  [facility] Pager: [phone] 03/15/2014, 1:44 PM

## 2014-03-15 NOTE — Progress Notes (Signed)
Pt. Has been discharged home she was given her discharge instructions, prescriptions, and all questions were answered. She is to be transported home by family.

## 2014-03-21 ENCOUNTER — Emergency Department (HOSPITAL_COMMUNITY): Payer: Medicaid Other

## 2014-03-21 ENCOUNTER — Encounter (HOSPITAL_COMMUNITY): Payer: Self-pay | Admitting: Emergency Medicine

## 2014-03-21 ENCOUNTER — Inpatient Hospital Stay (HOSPITAL_COMMUNITY)
Admission: EM | Admit: 2014-03-21 | Discharge: 2014-03-25 | DRG: 758 | Disposition: A | Discharge status: Home / Self Care | Payer: Medicaid Other | Attending: Obstetrics and Gynecology | Admitting: Obstetrics and Gynecology

## 2014-03-21 DIAGNOSIS — A6000 Herpesviral infection of urogenital system, unspecified: Secondary | ICD-10-CM | POA: Diagnosis present

## 2014-03-21 DIAGNOSIS — A749 Chlamydial infection, unspecified: Secondary | ICD-10-CM | POA: Diagnosis present

## 2014-03-21 DIAGNOSIS — Z8619 Personal history of other infectious and parasitic diseases: Secondary | ICD-10-CM | POA: Diagnosis present

## 2014-03-21 DIAGNOSIS — A549 Gonococcal infection, unspecified: Secondary | ICD-10-CM | POA: Diagnosis present

## 2014-03-21 DIAGNOSIS — E876 Hypokalemia: Secondary | ICD-10-CM | POA: Diagnosis present

## 2014-03-21 DIAGNOSIS — B1920 Unspecified viral hepatitis C without hepatic coma: Secondary | ICD-10-CM

## 2014-03-21 DIAGNOSIS — R109 Unspecified abdominal pain: Secondary | ICD-10-CM

## 2014-03-21 DIAGNOSIS — Z8249 Family history of ischemic heart disease and other diseases of the circulatory system: Secondary | ICD-10-CM

## 2014-03-21 DIAGNOSIS — IMO0002 Reserved for concepts with insufficient information to code with codable children: Secondary | ICD-10-CM

## 2014-03-21 DIAGNOSIS — K7689 Other specified diseases of liver: Secondary | ICD-10-CM | POA: Diagnosis present

## 2014-03-21 DIAGNOSIS — R000 Tachycardia, unspecified: Secondary | ICD-10-CM | POA: Diagnosis present

## 2014-03-21 DIAGNOSIS — A5619 Other chlamydial genitourinary infection: Secondary | ICD-10-CM | POA: Diagnosis present

## 2014-03-21 DIAGNOSIS — Z87891 Personal history of nicotine dependence: Secondary | ICD-10-CM

## 2014-03-21 DIAGNOSIS — N7093 Salpingitis and oophoritis, unspecified: Principal | ICD-10-CM | POA: Diagnosis present

## 2014-03-21 DIAGNOSIS — K659 Peritonitis, unspecified: Secondary | ICD-10-CM

## 2014-03-21 DIAGNOSIS — R748 Abnormal levels of other serum enzymes: Secondary | ICD-10-CM | POA: Diagnosis present

## 2014-03-21 DIAGNOSIS — A5400 Gonococcal infection of lower genitourinary tract, unspecified: Secondary | ICD-10-CM | POA: Diagnosis present

## 2014-03-21 DIAGNOSIS — R1011 Right upper quadrant pain: Secondary | ICD-10-CM | POA: Diagnosis present

## 2014-03-21 DIAGNOSIS — R933 Abnormal findings on diagnostic imaging of other parts of digestive tract: Secondary | ICD-10-CM

## 2014-03-21 DIAGNOSIS — Z833 Family history of diabetes mellitus: Secondary | ICD-10-CM

## 2014-03-21 DIAGNOSIS — N739 Female pelvic inflammatory disease, unspecified: Secondary | ICD-10-CM | POA: Diagnosis present

## 2014-03-21 DIAGNOSIS — N730 Acute parametritis and pelvic cellulitis: Secondary | ICD-10-CM

## 2014-03-21 DIAGNOSIS — K828 Other specified diseases of gallbladder: Secondary | ICD-10-CM | POA: Diagnosis present

## 2014-03-21 DIAGNOSIS — K838 Other specified diseases of biliary tract: Secondary | ICD-10-CM | POA: Diagnosis present

## 2014-03-21 HISTORY — DX: Depression, unspecified: F32.A

## 2014-03-21 HISTORY — DX: Herpesviral infection, unspecified: B00.9

## 2014-03-21 HISTORY — DX: Major depressive disorder, single episode, unspecified: F32.9

## 2014-03-21 HISTORY — DX: Chlamydial infection of genitourinary tract, unspecified: A56.2

## 2014-03-21 LAB — CBC WITH DIFFERENTIAL/PLATELET
BASOS ABS: 0 10*3/uL (ref 0.0–0.1)
Basophils Relative: 0 % (ref 0–1)
Eosinophils Absolute: 0.3 10*3/uL (ref 0.0–0.7)
Eosinophils Relative: 2 % (ref 0–5)
HCT: 37.2 % (ref 36.0–46.0)
Hemoglobin: 13.8 g/dL (ref 12.0–15.0)
LYMPHS ABS: 3.5 10*3/uL (ref 0.7–4.0)
Lymphocytes Relative: 27 % (ref 12–46)
MCH: 30 pg (ref 26.0–34.0)
MCHC: 37.1 g/dL — AB (ref 30.0–36.0)
MCV: 80.9 fL (ref 78.0–100.0)
MONO ABS: 0.5 10*3/uL (ref 0.1–1.0)
Monocytes Relative: 4 % (ref 3–12)
Neutro Abs: 8.6 10*3/uL — ABNORMAL HIGH (ref 1.7–7.7)
Neutrophils Relative %: 67 % (ref 43–77)
PLATELETS: 487 10*3/uL — AB (ref 150–400)
RBC: 4.6 MIL/uL (ref 3.87–5.11)
RDW: 14.7 % (ref 11.5–15.5)
WBC: 12.9 10*3/uL — AB (ref 4.0–10.5)

## 2014-03-21 LAB — COMPREHENSIVE METABOLIC PANEL
ALBUMIN: 3.7 g/dL (ref 3.5–5.2)
ALT: 12 U/L (ref 0–35)
AST: 18 U/L (ref 0–37)
Alkaline Phosphatase: 55 U/L (ref 39–117)
BUN: 12 mg/dL (ref 6–23)
CO2: 27 mEq/L (ref 19–32)
CREATININE: 0.72 mg/dL (ref 0.50–1.10)
Calcium: 9.5 mg/dL (ref 8.4–10.5)
Chloride: 102 mEq/L (ref 96–112)
GFR calc non Af Amer: >90 mL/min (ref >90)
GLUCOSE: 100 mg/dL — AB (ref 70–99)
Potassium: 3.4 mEq/L — ABNORMAL LOW (ref 3.7–5.3)
Sodium: 141 mEq/L (ref 137–147)
TOTAL PROTEIN: 7.5 g/dL (ref 6.0–8.3)
Total Bilirubin: 0.3 mg/dL (ref 0.3–1.2)

## 2014-03-21 LAB — URINALYSIS, ROUTINE W REFLEX MICROSCOPIC
Bilirubin Urine: NEGATIVE
Glucose, UA: NEGATIVE mg/dL
HGB URINE DIPSTICK: NEGATIVE
Ketones, ur: NEGATIVE mg/dL
Leukocytes, UA: NEGATIVE
NITRITE: NEGATIVE
PROTEIN: NEGATIVE mg/dL
SPECIFIC GRAVITY, URINE: 1.029 (ref 1.005–1.030)
UROBILINOGEN UA: 0.2 mg/dL (ref 0.0–1.0)
pH: 6 (ref 5.0–8.0)

## 2014-03-21 LAB — LIPASE, BLOOD: LIPASE: 250 U/L — AB (ref 11–59)

## 2014-03-21 LAB — AMYLASE: Amylase: 78 U/L (ref 0–105)

## 2014-03-21 LAB — I-STAT CG4 LACTIC ACID, ED: LACTIC ACID, VENOUS: 1.18 mmol/L (ref 0.5–2.2)

## 2014-03-21 LAB — PREGNANCY, URINE: Preg Test, Ur: NEGATIVE

## 2014-03-21 MED ORDER — DIPHENHYDRAMINE HCL 50 MG/ML IJ SOLN: 12.5000 mg | Freq: Four times a day (QID) | INTRAMUSCULAR | Status: DC | PRN

## 2014-03-21 MED ORDER — HYDROMORPHONE 0.3 MG/ML IV SOLN
INTRAVENOUS | Status: DC
  Administered 2014-03-21: 15:00:00 via INTRAVENOUS
  Administered 2014-03-21: 0.6 mg via INTRAVENOUS
  Administered 2014-03-21: 0.9 mg via INTRAVENOUS
  Administered 2014-03-22: 0.6 mg via INTRAVENOUS
  Administered 2014-03-22: 0.3 mg via INTRAVENOUS
  Administered 2014-03-22: 0.9 mg via INTRAVENOUS
  Administered 2014-03-22: 0.3 mg via INTRAVENOUS
  Administered 2014-03-22: 0.9 mg via INTRAVENOUS
  Administered 2014-03-22 – 2014-03-23 (×2): 0.3 mg via INTRAVENOUS
  Administered 2014-03-23: 0.6 mg via INTRAVENOUS
  Administered 2014-03-23: 0.9 mg via INTRAVENOUS
  Filled 2014-03-21: qty 25

## 2014-03-21 MED ORDER — IOHEXOL 300 MG/ML  SOLN
100.0000 mL | Freq: Once | INTRAMUSCULAR | Status: AC | PRN
  Administered 2014-03-21: 100 mL via INTRAVENOUS

## 2014-03-21 MED ORDER — DIPHENHYDRAMINE HCL 12.5 MG/5ML PO ELIX: 12.5000 mg | ORAL_SOLUTION | Freq: Four times a day (QID) | ORAL | Status: DC | PRN

## 2014-03-21 MED ORDER — DIPHENHYDRAMINE HCL 50 MG/ML IJ SOLN
25.0000 mg | Freq: Once | INTRAMUSCULAR | Status: AC
  Administered 2014-03-21: 25 mg via INTRAVENOUS
  Filled 2014-03-21: qty 1

## 2014-03-21 MED ORDER — MORPHINE SULFATE 4 MG/ML IJ SOLN
4.0000 mg | Freq: Once | INTRAMUSCULAR | Status: AC
  Administered 2014-03-21: 4 mg via INTRAVENOUS
  Filled 2014-03-21: qty 1

## 2014-03-21 MED ORDER — PIPERACILLIN-TAZOBACTAM 3.375 G IVPB
3.3750 g | Freq: Three times a day (TID) | INTRAVENOUS | Status: DC
  Administered 2014-03-21 – 2014-03-24 (×10): 3.375 g via INTRAVENOUS
  Filled 2014-03-21 (×11): qty 50

## 2014-03-21 MED ORDER — PRENATAL MULTIVITAMIN CH
1.0000 | ORAL_TABLET | Freq: Every day | ORAL | Status: DC
  Administered 2014-03-22 – 2014-03-24 (×3): 1 via ORAL
  Filled 2014-03-21 (×4): qty 1

## 2014-03-21 MED ORDER — SODIUM CHLORIDE 0.9 % IV BOLUS (SEPSIS)
1000.0000 mL | Freq: Once | INTRAVENOUS | Status: AC
  Administered 2014-03-21: 1000 mL via INTRAVENOUS

## 2014-03-21 MED ORDER — SODIUM CHLORIDE 0.9 % IJ SOLN: 9.0000 mL | INTRAMUSCULAR | Status: DC | PRN

## 2014-03-21 MED ORDER — DOCUSATE SODIUM 100 MG PO CAPS
100.0000 mg | ORAL_CAPSULE | Freq: Two times a day (BID) | ORAL | Status: DC
  Administered 2014-03-21 – 2014-03-25 (×9): 100 mg via ORAL
  Filled 2014-03-21 (×9): qty 1

## 2014-03-21 MED ORDER — ONDANSETRON HCL 4 MG PO TABS
4.0000 mg | ORAL_TABLET | Freq: Four times a day (QID) | ORAL | Status: DC | PRN
  Administered 2014-03-24: 4 mg via ORAL

## 2014-03-21 MED ORDER — DOXYCYCLINE HYCLATE 100 MG PO TABS
100.0000 mg | ORAL_TABLET | Freq: Two times a day (BID) | ORAL | Status: DC
  Administered 2014-03-21: 100 mg via ORAL
  Filled 2014-03-21 (×3): qty 1

## 2014-03-21 MED ORDER — ZOLPIDEM TARTRATE 5 MG PO TABS
5.0000 mg | ORAL_TABLET | Freq: Every evening | ORAL | Status: DC | PRN
  Administered 2014-03-23 – 2014-03-24 (×2): 5 mg via ORAL
  Filled 2014-03-21 (×2): qty 1

## 2014-03-21 MED ORDER — NALOXONE HCL 0.4 MG/ML IJ SOLN: 0.4000 mg | INTRAMUSCULAR | Status: DC | PRN

## 2014-03-21 MED ORDER — ONDANSETRON HCL 4 MG/2ML IJ SOLN: 4.0000 mg | Freq: Four times a day (QID) | INTRAMUSCULAR | Status: DC | PRN

## 2014-03-21 MED ORDER — ONDANSETRON HCL 4 MG/2ML IJ SOLN
4.0000 mg | INTRAMUSCULAR | Status: AC
  Administered 2014-03-21: 4 mg via INTRAVENOUS
  Filled 2014-03-21: qty 2

## 2014-03-21 MED ORDER — CEFOTETAN DISODIUM 2 G IJ SOLR
2.0000 g | Freq: Two times a day (BID) | INTRAMUSCULAR | Status: DC
  Administered 2014-03-21: 2 g via INTRAVENOUS
  Filled 2014-03-21 (×2): qty 2

## 2014-03-21 MED ORDER — METRONIDAZOLE IN NACL 5-0.79 MG/ML-% IV SOLN
500.0000 mg | Freq: Once | INTRAVENOUS | Status: AC
  Administered 2014-03-21: 500 mg via INTRAVENOUS
  Filled 2014-03-21: qty 100

## 2014-03-21 MED ORDER — KETOROLAC TROMETHAMINE 30 MG/ML IJ SOLN: 30.0000 mg | Freq: Four times a day (QID) | INTRAMUSCULAR | Status: DC

## 2014-03-21 MED ORDER — CIPROFLOXACIN IN D5W 400 MG/200ML IV SOLN
400.0000 mg | Freq: Once | INTRAVENOUS | Status: AC
  Administered 2014-03-21: 400 mg via INTRAVENOUS
  Filled 2014-03-21: qty 200

## 2014-03-21 MED ORDER — KETOROLAC TROMETHAMINE 30 MG/ML IJ SOLN
30.0000 mg | Freq: Four times a day (QID) | INTRAMUSCULAR | Status: DC
  Administered 2014-03-21 – 2014-03-24 (×13): 30 mg via INTRAVENOUS
  Filled 2014-03-21 (×13): qty 1

## 2014-03-21 MED ORDER — LACTATED RINGERS IV SOLN
INTRAVENOUS | Status: DC
  Administered 2014-03-21 – 2014-03-24 (×10): via INTRAVENOUS
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
## 2014-03-21 NOTE — ED Provider Notes (Signed)
Shared service with midlevel provider. I have personally seen and examined the patient, providing direct face to face care, presenting with the chief complaint of abdominal pain. Physical exam findings include diffuse abdominal tenderness, with rebound and guarding. Pt is noted to be limping upon ambulation. Lipase is elevated today, pt has mild leukocytosis. Concerns for abscess, peritonitis, ruptured appendix etc. Spoke with Surgery, given peritoneal findings, and they rather have CT completed - and CT shows intra-abd abscess. Plan will be surgery consultation and admission . I have reviewed the nursing documentation on past medical history, family history, and social history.  Dr. Romeo AppleHarrison to help with admission and disposition.   Derwood KaplanAnkit Velma Hanna, MD 03/21/14 0900

## 2014-03-21 NOTE — Progress Notes (Signed)
ANTIBIOTIC CONSULT NOTE - INITIAL  Pharmacy Consult for Zosyn Indication: Tubo-Ovarian Abscess  No Known Allergies  Patient Measurements: Height: 5\' 5"  (165.1 cm) Weight: 128 lb (58.06 kg) IBW/kg (Calculated) : 57   Vital Signs: Temp: 98.6 F (37 C) (04/19 1310) Temp src: Oral (04/19 1310) BP: 104/67 mmHg (04/19 1310) Pulse Rate: 67 (04/19 1310) Intake/Output from previous day:   Intake/Output from this shift: Total I/O In: -  Out: 300 [Urine:300]  Labs:  Recent Labs  03/21/14 0336  WBC 12.9*  HGB 13.8  PLT 487*  CREATININE 0.72   Estimated Creatinine Clearance: 100.1 ml/min (by C-G formula based on Cr of 0.72).   Microbiology: Recent Results (from the past 720 hour(s))  GC/CHLAMYDIA PROBE AMP     Status: Abnormal   Collection Time    03/12/14 12:26 PM      Result Value Ref Range Status   CT Probe RNA POSITIVE (*) NEGATIVE Final   Comment: (NOTE)     A Positive CT or NG Nucleic Acid Amplification Test (NAAT) result     should be considered presumptive evidence of infection.  The result     should be evaluated along with physical examination and other     diagnostic findings.   GC Probe RNA POSITIVE (*) NEGATIVE Final   Comment: (NOTE)     A Positive CT or NG Nucleic Acid Amplification Test (NAAT) result     should be considered presumptive evidence of infection.  The result     should be evaluated along with physical examination and other     diagnostic findings.                                                                                               **Normal Reference Range: Negative**          Assay performed using the Gen-Probe APTIMA COMBO2 (R) Assay.     Acceptable specimen types for this assay include APTIMA Swabs (Unisex,     endocervical, urethral, or vaginal), first void urine, and ThinPrep     liquid based cytology samples.     Performed at [facility]  WET PREP, GENITAL     Status: Abnormal   Collection Time    03/12/14 12:26  PM      Result Value Ref Range Status   Yeast Wet Prep HPF POC NONE SEEN  NONE SEEN Final   Trich, Wet Prep NONE SEEN  NONE SEEN Final   Clue Cells Wet Prep HPF POC FEW (*) NONE SEEN Final   WBC, Wet Prep HPF POC FEW (*) NONE SEEN Final  URINE CULTURE     Status: None   Collection Time    03/12/14  6:58 PM      Result Value Ref Range Status   Specimen Description URINE, CLEAN CATCH   Final   Special Requests NONE   Final   Culture  Setup Time     Final   Value: 03/13/2014 01:16     Performed at [facility]   Colony Count     Final   Value:  20,OOO COLONIES/ML     Performed at [facility]   Culture     Final   Value: Multiple bacterial morphotypes present, none predominant. Suggest appropriate recollection if clinically indicated.     Performed at [facility]   Report Status 03/14/2014 FINAL   Final    Medical History: History reviewed. No pertinent past medical history.  Medications:  [facility]. Doxycycline po 4/13-4/19. Cefotan 2 gram IV q12h and Doxycycline po [facility]  Assessment: 23[facility]. [facility] treated with IV Cipro and Flagyl and discharged on oral Doxycycline due to postitive GC and Chlamydia. Pt returned to [facility] this am because abdominal pain never improved. Zosyn initiated per ID consult to provide broader coverage than the Cefotan and Doxycyline that pt is currently receiving.    Plan:  1. Zosyn 3.375 gram IV q8h. 2. Will continue to follow but no further Zosyn dosage should be required.  Claybon JabsAndrea G Waneda Klammer 03/21/2014,5:30 PM

## 2014-03-21 NOTE — ED Notes (Addendum)
Pt reports rash to L arm where the IV abx, Cipro was running. Stopped IV and notified Dr. Romeo AppleHarrison. Pt has rash only to L arm and does not appear to other parts of body. Pt is A&O and in NAD. Pt denies itching.

## 2014-03-21 NOTE — ED Provider Notes (Signed)
CSN: [id number]     Arrival date & time 03/21/14  0202 History   First MD Initiated Contact with Patient 03/21/14 0252     Chief Complaint  Patient presents with  . Abdominal Pain    (Consider location/radiation/quality/duration/timing/severity/associated sxs/prior Treatment) HPI Comments: Patient is a 23 year old female with a history of cesarean section who presents to the emergency department for abdominal pain. Patient states that abdominal pain has been present since 03/12/2014. Patient was seen and evaluated for this abdominal pain on this day and diagnosed with enteritis and mesenteric adenitis. She was discharged on 03/15/2014 with doxycycline, hydrocodone, and ibuprofen for symptom management. Patient states that since discharge her symptoms have not improved. She states her pain has persisted and is present, primarily, down the right side of her abdomen; however, she does experience the pain diffusely. Patient states the pain is worse with movement as well as in the mornings after patient has not had pain medicine throughout the night. She states her pain is fairly well controlled with 2 tablets of Norco every 4 hours, but that she is almost out of this medication. Patient states that 2 days after her discharge she tried returning to school. She states she had 3 episodes of nonbloody/nonbilious emesis while at school and experienced associated diaphoresis and lightheadedness. She states that the symptoms have now recurred since this time. Patient denies associated fever, chest pain, shortness of breath, urinary symptoms, vaginal bleeding or discharge, diarrhea, melena or hematochezia, numbness/tingling, and syncope. She has had a loose BM in the last 48 hours.  The history is provided by the patient. No language interpreter was used.    History reviewed. No pertinent past medical history. Past Surgical History  Procedure Laterality Date  . Cesarean section     Family History  Problem  Relation Age of Onset  . Diabetes Sister   . Diabetes Maternal Aunt   . Hypertension Maternal Aunt   . Diabetes Maternal Uncle   . Hypertension Maternal Uncle    History  Substance Use Topics  . Smoking status: Former Smoker -- 1.00 packs/day    Types: Cigarettes    Quit date: 03/15/2014  . Smokeless tobacco: Never Used  . Alcohol Use: 1.8 oz/week    3 Glasses of wine per week   OB History   Grav Para Term Preterm Abortions TAB SAB Ect Mult Living   1 1        1       Review of Systems  Constitutional: Positive for chills and diaphoresis. Negative for fever.  Respiratory: Negative for shortness of breath.   Cardiovascular: Negative for chest pain.  Gastrointestinal: Positive for nausea, vomiting and abdominal pain. Negative for diarrhea and blood in stool.  Genitourinary: Negative for dysuria, hematuria, vaginal bleeding and vaginal discharge.  Neurological: Negative for weakness and numbness.  All other systems reviewed and are negative.    Allergies  Review of patient's allergies indicates no known allergies.  Home Medications   Prior to Admission medications   Medication Sig Start Date End Date Taking? Authorizing Provider  doxycycline (VIBRA-TABS) 100 MG tablet Take 1 tablet (100 mg total) by mouth every 12 (twelve) hours. For 6 days 03/15/14  Yes Estela Isaiah Blakes, MD  HYDROcodone-acetaminophen (NORCO/VICODIN) 5-325 MG per tablet Take 1-2 tablets by mouth every 4 (four) hours as needed for moderate pain. 03/15/14  Yes Estela Isaiah Blakes, MD  ibuprofen (ADVIL,MOTRIN) 200 MG tablet Take 200 mg by mouth every 6 (six) hours as  needed for cramping.   Yes Historical Provider, MD  tetrahydrozoline-zinc (VISINE-AC) 0.05-0.25 % ophthalmic solution Place 2 drops into both eyes 3 (three) times daily as needed (for itchy eyes).   Yes Historical Provider, MD   BP 131/87  Pulse 90  Temp(Src) 98.1 F (36.7 C) (Oral)  Resp 18  Ht 5\' 5"  (1.651 m)  Wt 128 lb (58.06  kg)  BMI 21.30 kg/m2  SpO2 100%  LMP 03/10/2014  Physical Exam  Nursing note and vitals reviewed. Constitutional: She is oriented to person, place, and time. She appears well-developed and well-nourished. No distress.  Nontoxic/nonseptic appearing  HENT:  Head: Normocephalic and atraumatic.  Mouth/Throat: Oropharynx is clear and moist. No oropharyngeal exudate.  Eyes: Conjunctivae and EOM are normal. Pupils are equal, round, and reactive to light. No scleral icterus.  Neck: Normal range of motion.  Cardiovascular: Normal rate, regular rhythm and normal heart sounds.   Pulmonary/Chest: Effort normal and breath sounds normal. No respiratory distress. She has no wheezes. She has no rales.  Abdominal: Soft. She exhibits no distension and no mass. There is tenderness.  Soft abdomen with diffuse TTP. No distinct focal tenderness on exam. No rigidity or masses palpated.  Musculoskeletal: Normal range of motion.  Neurological: She is alert and oriented to person, place, and time.  Skin: Skin is warm and dry. No rash noted. She is not diaphoretic. No erythema. No pallor.  Psychiatric: She has a normal mood and affect. Her behavior is normal.    ED Course  Procedures (including critical care time) Labs Review Labs Reviewed  CBC WITH DIFFERENTIAL - Abnormal; Notable for the following:    WBC 12.9 (*)    MCHC 37.1 (*)    Platelets 487 (*)    Neutro Abs 8.6 (*)    All other components within normal limits  COMPREHENSIVE METABOLIC PANEL - Abnormal; Notable for the following:    Potassium 3.4 (*)    Glucose, Bld 100 (*)    All other components within normal limits  LIPASE, BLOOD - Abnormal; Notable for the following:    Lipase 250 (*)    All other components within normal limits  URINALYSIS, ROUTINE W REFLEX MICROSCOPIC  PREGNANCY, URINE  I-STAT CG4 LACTIC ACID, ED   Imaging Review US Transvaginal Non-ob  03/12/2014   CLINICAL DATA:  Pelvic pain  EXAM: TRANSABDOMINAL AND TRANSVAGINAL  ULTRASOUND OF PELVIS  DOPPLER ULTRASOUND OF OVARIES  TECHNIQUE: Both transabdominal and transvaginal ultrasound examinations of the pelvis were performed. Transabdominal technique was performed for global imaging of the pelvis including uterus, ovaries, adnexal regions, and pelvic cul-de-sac.  It was necessary to proceed with endovaginal exam following the transabdominal exam to visualize the uterus, endometrium, ovaries and adnexa . Color and duplex Doppler ultrasound was utilized to evaluate blood flow to the ovaries.  COMPARISON:  CT ABD/PELVIS W CM dated 07/10/2011  FINDINGS: Uterus  Measurements: 7.2 x 3.0 x 4.1 cm. No fibroids or other mass visualized.  Endometrium  Thickness: Normal thickness, 5 mm.  No focal abnormality visualized.  Right ovary  Measurements: 2.8 x 1.8 x 1.9 cm. Normal appearance/no adnexal mass. Multiple small follicles.  Left ovary  Measurements: 2.4 x 2.2 x 2.7 cm. Normal appearance/no adnexal mass. Multiple small follicles.  Other findings:  Small amount of free fluid in the pelvis.  Pulsed Doppler evaluation of both ovaries demonstrates normal low-resistance arterial and venous waveforms.  IMPRESSION:  Unremarkable pelvic ultrasound.  No evidence of ovarian torsion.   Electronically Signed  By: Charlett NoseKevin  Dover M.D.   On: 03/12/2014 13:27   [place] Pelvis Complete  03/12/2014   CLINICAL DATA:  Pelvic pain  EXAM: TRANSABDOMINAL AND TRANSVAGINAL ULTRASOUND OF PELVIS  DOPPLER ULTRASOUND OF OVARIES  TECHNIQUE: Both transabdominal and transvaginal ultrasound examinations of the pelvis were performed. Transabdominal technique was performed for global imaging of the pelvis including uterus, ovaries, adnexal regions, and pelvic cul-de-sac.  It was necessary to proceed with endovaginal exam following the transabdominal exam to visualize the uterus, endometrium, ovaries and adnexa . Color and duplex Doppler ultrasound was utilized to evaluate blood flow to the ovaries.  COMPARISON:  CT ABD/PELVIS W CM  dated 07/10/2011  FINDINGS: Uterus  Measurements: 7.2 x 3.0 x 4.1 cm. No fibroids or other mass visualized.  Endometrium  Thickness: Normal thickness, 5 mm.  No focal abnormality visualized.  Right ovary  Measurements: 2.8 x 1.8 x 1.9 cm. Normal appearance/no adnexal mass. Multiple small follicles.  Left ovary  Measurements: 2.4 x 2.2 x 2.7 cm. Normal appearance/no adnexal mass. Multiple small follicles.  Other findings:  Small amount of free fluid in the pelvis.  Pulsed Doppler evaluation of both ovaries demonstrates normal low-resistance arterial and venous waveforms.  IMPRESSION:  Unremarkable pelvic ultrasound.  No evidence of ovarian torsion.   Electronically Signed   By: Charlett NoseKevin  Dover M.D.   On: 03/12/2014 13:27   Ct Abdomen Pelvis W Contrast  03/12/2014   CLINICAL DATA:  Right lower quadrant abdominal pain.  EXAM: CT ABDOMEN AND PELVIS WITH CONTRAST  TECHNIQUE: Multidetector CT imaging of the abdomen and pelvis was performed using the standard protocol following bolus administration of intravenous contrast.  CONTRAST:  50mL OMNIPAQUE IOHEXOL 300 MG/ML SOLN, 80mL OMNIPAQUE IOHEXOL 300 MG/ML SOLN  COMPARISON:  CT scan 07/10/2011  FINDINGS: The lung bases are clear.  No pleural effusion.  The solid abdominal organs are normal. The gallbladder is normal. No common bowel duct dilatation.  The stomach and duodenum are unremarkable. The small bowel loops demonstrate mild inflammatory changes with edema in the mesenteric. The colon is unremarkable. I do not see the appendix for certain. The cecum is lying deep in the pelvis as is the terminal ileum. I do not see any definite findings for appendicitis. The uterus and ovaries appear normal. The bladder is normal. There is a small amount of free pelvic fluid.  The aorta is normal in caliber. The major branch vessels are normal. The major venous structures are patent. Small scattered mesenteric lymph nodes suggesting mesenteric adenitis.  IMPRESSION: 1. No definite CT  findings for acute appendicitis. The appendix is not identified for certain. But I do not see any definite findings for appendicitis. 2. Suspect enteritis and mesenteric adenitis.   Electronically Signed   By: Loralie ChampagneMark  Gallerani M.D.   On: 03/12/2014 16:46   [place] Art/ven Flow Abd Pelv Doppler  03/12/2014   CLINICAL DATA:  Pelvic pain  EXAM: TRANSABDOMINAL AND TRANSVAGINAL ULTRASOUND OF PELVIS  DOPPLER ULTRASOUND OF OVARIES  TECHNIQUE: Both transabdominal and transvaginal ultrasound examinations of the pelvis were performed. Transabdominal technique was performed for global imaging of the pelvis including uterus, ovaries, adnexal regions, and pelvic cul-de-sac.  It was necessary to proceed with endovaginal exam following the transabdominal exam to visualize the uterus, endometrium, ovaries and adnexa . Color and duplex Doppler ultrasound was utilized to evaluate blood flow to the ovaries.  COMPARISON:  CT ABD/PELVIS W CM dated 07/10/2011  FINDINGS: Uterus  Measurements: 7.2 x 3.0 x 4.1 cm. No fibroids  or other mass visualized.  Endometrium  Thickness: Normal thickness, 5 mm.  No focal abnormality visualized.  Right ovary  Measurements: 2.8 x 1.8 x 1.9 cm. Normal appearance/no adnexal mass. Multiple small follicles.  Left ovary  Measurements: 2.4 x 2.2 x 2.7 cm. Normal appearance/no adnexal mass. Multiple small follicles.  Other findings:  Small amount of free fluid in the pelvis.  Pulsed Doppler evaluation of both ovaries demonstrates normal low-resistance arterial and venous waveforms.  IMPRESSION:  Unremarkable pelvic ultrasound.  No evidence of ovarian torsion.   Electronically Signed   By: Charlett NoseKevin  Dover M.D.   On: 03/12/2014 13:27    MDM   Final diagnoses:  Abdominal pain    23 year old female presents for abdominal pain which has been persistent since 03/12/2014. Patient was admitted on this date and discharged on 03/15/2014 with doxycycline, ibuprofen, and hydrocodone. Patient today is nontoxic/nonseptic  appearing. She is afebrile and hemodynamically stable. On physical exam, abdomen is soft with diffuse tenderness. No distinct focal tenderness appreciated. No rigidity or masses palpated.  Labs significant for leukocytosis of 12.9. This is trending down from her discharge where WBC was 19.2. Of note, lipase is now elevated to 250. Lactate is WNL. UA unremarkable.  Have consulted with Dr. Rhunette CroftNanavati who evaluated patient and appreciated rebound and mild signs of peritonitis. Abdomen has remained soft without rigidity. Patient was supposed to be d/c'd on 03/15/14 with cipro and flagyl, but was not. Have ordered for these to be given IV. Patient made NPO. Plan includes surgical consult given signs of acute surgical abdomen; this will be completed by Dr. Rhunette CroftNanavati. Anticipate admission to hospital for further evaluation and symptom management.   Filed Vitals:   03/21/14 0222 03/21/14 0645  BP: 107/75 131/87  Pulse: 79 90  Temp: 98.1 F (36.7 C) 98.1 F (36.7 C)  TempSrc: Oral Oral  Resp: 18   Height: 5\' 5"  (1.651 m)   Weight: 128 lb (58.06 kg)   SpO2: 98% 100%     Antony MaduraKelly Danon Lograsso, PA-C 03/21/14 [phone]

## 2014-03-21 NOTE — Consult Note (Signed)
Reason for Consult:abdominal abscess and pain Referring Physician: Dr Pamella Pert MD  Heather Lane is an 23 y.o. female.  HPI: Asked to see patient at request of Dr Aline Brochure for abdominal pain. In hospital 10 days ago on medical service for abdominal pain.  CT showed mesenteric adenitis and enteritis and treated with antibiotics.  Improved after 3 days on ABX  and discharged but patient says she has been hurting ever since.  Complains of lower abdominal pain right greater than left  made worse by walking.  Had a GC probe during last admission that was positive.  Denies blood in stool or avoidence of food. No diarrhea.   No hx of IBD.  States she only has 1 sex partner and not sure if he has others. Had period 10 days ago but not complaining if vaginal discharge.  CT today shows pelvic abscess and reactive thickened SB.  Appendix normal.  Gallbladder has some sludge and is full CBD 8 mm without stone disease. No history of previous crampy abdominal pain.   History reviewed. No pertinent past medical history.  Past Surgical History  Procedure Laterality Date  . Cesarean section      Family History  Problem Relation Age of Onset  . Diabetes Sister   . Diabetes Maternal Aunt   . Hypertension Maternal Aunt   . Diabetes Maternal Uncle   . Hypertension Maternal Uncle     Social History:  reports that she quit smoking 6 days ago. Her smoking use included Cigarettes. She smoked 1.00 pack per day. She has never used smokeless tobacco. She reports that she drinks about 1.8 ounces of alcohol per week. She reports that she uses illicit drugs (Marijuana).  Allergies: No Known Allergies  Medications: I have reviewed the patient's current medications.  Results for orders placed during the hospital encounter of 03/21/14 (from the past 48 hour(s))  CBC WITH DIFFERENTIAL     Status: Abnormal   Collection Time    03/21/14  3:36 AM      Result Value Ref Range   WBC 12.9 (*) 4.0 - 10.5 K/uL   RBC  4.60  3.87 - 5.11 MIL/uL   Hemoglobin 13.8  12.0 - 15.0 g/dL   HCT 37.2  36.0 - 46.0 %   MCV 80.9  78.0 - 100.0 fL   MCH 30.0  26.0 - 34.0 pg   MCHC 37.1 (*) 30.0 - 36.0 g/dL   Comment: TARGET CELLS   RDW 14.7  11.5 - 15.5 %   Platelets 487 (*) 150 - 400 K/uL   Neutrophils Relative % 67  43 - 77 %   Lymphocytes Relative 27  12 - 46 %   Monocytes Relative 4  3 - 12 %   Eosinophils Relative 2  0 - 5 %   Basophils Relative 0  0 - 1 %   Neutro Abs 8.6 (*) 1.7 - 7.7 K/uL   Lymphs Abs 3.5  0.7 - 4.0 K/uL   Monocytes Absolute 0.5  0.1 - 1.0 K/uL   Eosinophils Absolute 0.3  0.0 - 0.7 K/uL   Basophils Absolute 0.0  0.0 - 0.1 K/uL   RBC Morphology TARGET CELLS     Smear Review LARGE PLATELETS PRESENT    COMPREHENSIVE METABOLIC PANEL     Status: Abnormal   Collection Time    03/21/14  3:36 AM      Result Value Ref Range   Sodium 141  137 - 147 mEq/L   Potassium 3.4 (*)  3.7 - 5.3 mEq/L   Chloride 102  96 - 112 mEq/L   CO2 27  19 - 32 mEq/L   Glucose, Bld 100 (*) 70 - 99 mg/dL   BUN 12  6 - 23 mg/dL   Creatinine, Ser 0.72  0.50 - 1.10 mg/dL   Calcium 9.5  8.4 - 10.5 mg/dL   Total Protein 7.5  6.0 - 8.3 g/dL   Albumin 3.7  3.5 - 5.2 g/dL   AST 18  0 - 37 U/L   ALT 12  0 - 35 U/L   Alkaline Phosphatase 55  39 - 117 U/L   Total Bilirubin 0.3  0.3 - 1.2 mg/dL   GFR calc non Af Amer >90  >90 mL/min   GFR calc Af Amer >90  >90 mL/min   Comment: (NOTE)     The eGFR has been calculated using the CKD EPI equation.     This calculation has not been validated in all clinical situations.     eGFR's persistently <90 mL/min signify possible Chronic Kidney     Disease.  LIPASE, BLOOD     Status: Abnormal   Collection Time    03/21/14  3:36 AM      Result Value Ref Range   Lipase 250 (*) 11 - 59 U/L  URINALYSIS, ROUTINE W REFLEX MICROSCOPIC     Status: None   Collection Time    03/21/14  3:37 AM      Result Value Ref Range   Color, Urine YELLOW  YELLOW   APPearance CLEAR  CLEAR   Specific  Gravity, Urine 1.029  1.005 - 1.030   pH 6.0  5.0 - 8.0   Glucose, UA NEGATIVE  NEGATIVE mg/dL   Hgb urine dipstick NEGATIVE  NEGATIVE   Bilirubin Urine NEGATIVE  NEGATIVE   Ketones, ur NEGATIVE  NEGATIVE mg/dL   Protein, ur NEGATIVE  NEGATIVE mg/dL   Urobilinogen, UA 0.2  0.0 - 1.0 mg/dL   Nitrite NEGATIVE  NEGATIVE   Leukocytes, UA NEGATIVE  NEGATIVE   Comment: MICROSCOPIC NOT DONE ON URINES WITH NEGATIVE PROTEIN, BLOOD, LEUKOCYTES, NITRITE, OR GLUCOSE <1000 mg/dL.  PREGNANCY, URINE     Status: None   Collection Time    03/21/14  3:37 AM      Result Value Ref Range   Preg Test, Ur NEGATIVE  NEGATIVE   Comment:            THE SENSITIVITY OF THIS     METHODOLOGY IS >20 mIU/mL.  I-STAT CG4 LACTIC ACID, ED     Status: None   Collection Time    03/21/14  4:05 AM      Result Value Ref Range   Lactic Acid, Venous 1.18  0.5 - 2.2 mmol/L    US Abdomen Complete  03/21/2014   CLINICAL DATA:  Right upper quadrant abdominal pain.  EXAM: ULTRASOUND ABDOMEN COMPLETE  COMPARISON:  CT of same date and 03/12/2014.  FINDINGS: Gallbladder:  Gallbladder distention. Sludge within. No wall thickening or pericholecystic fluid. Sonographic Murphy's sign was not elicited.  Common bile duct:  Diameter: Dilated, at 8 mm.  Liver:  No focal lesion identified. Within normal limits in parenchymal echogenicity.  IVC:  No abnormality visualized.  Pancreas:  Visualized portion unremarkable.  Spleen:  Size and appearance within normal limits.  Right Kidney:  Length: 10.9 cm. Echogenicity within normal limits. No mass or hydronephrosis visualized.  Left Kidney:  Length: 10.0 cm. Echogenicity within normal limits. No mass  or hydronephrosis visualized.  Abdominal aorta:  No aneurysm visualized.  Other findings:  Trace ascites adjacent the liver.  IMPRESSION: 1. Gallbladder sludge and gallbladder distention. No evidence of acute cholecystitis. 2. Common duct dilatation, 8 mm. If bilirubin is elevated, MRCP should be considered  to exclude choledocholithiasis. 3. Trace perihepatic ascites.   Electronically Signed   By: Abigail Miyamoto M.D.   On: 03/21/2014 09:03   Ct Abdomen Pelvis W Contrast  03/21/2014   CLINICAL DATA:  Right-sided abdominal pain.  EXAM: CT ABDOMEN AND PELVIS WITH CONTRAST  TECHNIQUE: Multidetector CT imaging of the abdomen and pelvis was performed using the standard protocol following bolus administration of intravenous contrast.  CONTRAST:  18mL OMNIPAQUE IOHEXOL 300 MG/ML  SOLN  COMPARISON:  03/12/2014  FINDINGS: Lower Chest: Clear lung bases. Normal heart size without pericardial or pleural effusion.  Abdomen/Pelvis: Mild hepatic steatosis. Normal spleen, stomach, pancreas. Normal gallbladder. Common duct upper normal to minimally dilated at 7 mm on image 23. No obstructive stone identified.  Normal adrenal glands and kidneys. No retroperitoneal or retrocrural adenopathy. Large colonic stool burden, especially in the cecum. Appendix is air-filled and normal on image 47. The terminal ileum is not well visualized. May be identified and thickened on image 52. There is a paucity of abdominal and pelvic fat. Normal caliber of small bowel loops. Development of a peripherally enhancing fluid collection anterior to the uterus and posterior to the urinary bladder. 1.4 x 6.2 cm on image 57. Adjacent and possibly contiguous cul-de-sac peripherally enhancing fluid collection of 6.3 x 4.1 cm on image 59.  No pelvic adenopathy. Normal urinary bladder. No air within the bladder. Normal uterus. No dominant adnexal mass.  Bones/Musculoskeletal:  S-shaped lumbar spine curvature.  IMPRESSION: 1. Pelvic fluid collection/collections, highly suspicious for abscesses. 2. Suspicion of distal/terminal ileitis. This could represent Crohn disease or infectious enteritis. 3.  Possible constipation. 4. Mild common duct dilatation. Consider correlation with bilirubin levels. If these are elevated, MRCP should be considered. 5. Normal appendix    Electronically Signed   By: Abigail Miyamoto M.D.   On: 03/21/2014 08:34    Review of Systems  Constitutional: Positive for weight loss and malaise/fatigue.  HENT: Negative.   Eyes: Negative.   Respiratory: Negative.   Cardiovascular: Negative.   Gastrointestinal: Positive for abdominal pain. Negative for diarrhea, blood in stool and melena.  Genitourinary: Negative.   Musculoskeletal: Negative.   Skin: Negative.   Neurological: Negative.   Endo/Heme/Allergies: Negative.   Psychiatric/Behavioral: Negative.    Blood pressure 120/84, pulse 69, temperature 98.1 F (36.7 C), temperature source Oral, resp. rate 17, height $RemoveBe'5\' 5"'uZxiMMKWm$  (1.651 m), weight 128 lb (58.06 kg), last menstrual period 03/10/2014, SpO2 99.00%. Physical Exam  Constitutional: She is oriented to person, place, and time. She appears well-developed and well-nourished.  HENT:  Head: Normocephalic and atraumatic.  Eyes: Pupils are equal, round, and reactive to light. No scleral icterus.  Neck: Normal range of motion. Neck supple.  Cardiovascular: Normal rate and regular rhythm.   Respiratory: Effort normal.  GI: There is tenderness in the right lower quadrant, suprapubic area and left lower quadrant. There is guarding. There is no rigidity, no tenderness at McBurney's point and negative Murphy's sign.    Musculoskeletal: Normal range of motion.  Neurological: She is alert and oriented to person, place, and time.  Skin: Skin is warm and dry.  Psychiatric: She has a normal mood and affect. Her behavior is normal. Judgment and thought content normal.  Assessment/Plan: Pelvic abscess - not from appendix -needs GYN evaluation give hx of GC probe  -Needs GI consult to evaluate for IBD/ Crohn's disease but presentation not typical -unlikely diverticulitis - IR consult for consideration of percutaneous drainage - IV ABX ZOSYN OR invanz since she has failed cipro / flagyl Does not need emergent exploration at this point.  Can  give clear liquids for now and make NPO after midnight if drainage by IR possible. Will follow.   Bellamie Turney A. Lexys Milliner 03/21/2014, 9:34 AM

## 2014-03-21 NOTE — ED Notes (Signed)
MD at bedside. 

## 2014-03-21 NOTE — Consult Note (Addendum)
[facility]    Date of Admission:  03/21/2014   Total days of antibiotics 10        Day 8 doxycycline        Day 1 cefotetan       Reason for Consult: Pelvic abscess following recent treatment for gonorrhea and chlamydia    Referring Physician: Dr. Dois DavenportSandra Lane   Principal Problem:   Pelvic abscess in female Active Problems:   Gonorrhea   Chlamydia   History of herpes genitalis   . cefoTEtan (CEFOTAN) IV  2 g Intravenous Q12H  . docusate sodium  100 mg Oral BID  . doxycycline  100 mg Oral Q12H  . HYDROmorphone PCA 0.3 mg/mL   Intravenous 6 times per day  . ketorolac  30 mg Intravenous 4 times per day   Or  . ketorolac  30 mg Intramuscular 4 times per day  . prenatal multivitamin  1 tablet Oral Q1200    Recommendations: 1. Change antibiotic therapy to piperacillin tazobactam 2. Check RPR and HIV antibody   Assessment: She has developed a fairly large pelvic abscess while on therapy for PID related to gonorrhea and chlamydia. After full evaluation of the patient I feel it would be best to broaden her empiric antibiotic therapy. She will need serial exams and imaging and may need abscess drainage. I will followup tomorrow.    HPI: Heather Lane is a 23 y.o. female who developed lower abdominal pain about 2 weeks ago. [facility] April [facility]. Her CT scan showed some mesenteric adenitis and small bowel thickening. There was no evidence of appendicitis or pelvic abscess. She was initially treated with ciprofloxacin and metronidazole. Her GC and Chlamydia probes were both positive so she was given a single dose of ceftriaxone and started on oral doxycycline. She was discharged on April 13. She was able to fill her prescription for doxycycline and continued to take it until she was readmitted yesterday for worsening abdominal pain. Repeat CT scan shows a large bilobed pelvic abscess.   Review of  Systems: Constitutional: positive for sweats, negative for chills and fevers Eyes: negative Ears, nose, mouth, throat, and face: negative Respiratory: negative Cardiovascular: negative Gastrointestinal: positive for abdominal pain, change in bowel habits, constipation and nausea, negative for diarrhea and vomiting Genitourinary:positive for vaginal discharge, negative for dysuria  History reviewed. No pertinent past medical history.  History  Substance Use Topics  . Smoking status: Former Smoker -- 1.00 packs/day    Types: Cigarettes    Quit date: 03/15/2014  . Smokeless tobacco: Never Used  . Alcohol Use: 1.8 oz/week    3 Glasses of wine per week    Family History  Problem Relation Age of Onset  . Diabetes Sister   . Diabetes Maternal Aunt   . Hypertension Maternal Aunt   . Diabetes Maternal Uncle   . Hypertension Maternal Uncle    No Known Allergies  OBJECTIVE: Blood pressure 104/67, pulse 67, temperature 98.6 F (37 C), temperature source Oral, resp. rate 18, height 5\' 5"  (1.651 m), weight 58.06 kg (128 lb), last menstrual period 03/10/2014, SpO2 99.00%. General: She is alert and talkative. She is uncomfortable due to pain and Skin: No rash Lungs: Clear Cor: Regular S1 and S2 with no murmurs Abdomen: Soft. Lower quadrant tenderness without rebound or guarding   Lab Results Lab Results  Component Value Date   WBC 12.9* 03/21/2014  HGB 13.8 03/21/2014   HCT 37.2 03/21/2014   MCV 80.9 03/21/2014   PLT 487* 03/21/2014    Lab Results  Component Value Date   CREATININE 0.72 03/21/2014   BUN 12 03/21/2014   NA 141 03/21/2014   K 3.4* 03/21/2014   CL 102 03/21/2014   CO2 27 03/21/2014    Lab Results  Component Value Date   ALT 12 03/21/2014   AST 18 03/21/2014   ALKPHOS 55 03/21/2014   BILITOT 0.3 03/21/2014     Microbiology: Recent Results (from the past 240 hour(s))  GC/CHLAMYDIA PROBE AMP     Status: Abnormal   Collection Time    03/12/14 12:26 PM      Result  Value Ref Range Status   CT Probe RNA POSITIVE (*) NEGATIVE Final   Comment: (NOTE)     A Positive CT or NG Nucleic Acid Amplification Test (NAAT) result     should be considered presumptive evidence of infection.  The result     should be evaluated along with physical examination and other     diagnostic findings.   GC Probe RNA POSITIVE (*) NEGATIVE Final   Comment: (NOTE)     A Positive CT or NG Nucleic Acid Amplification Test (NAAT) result     should be considered presumptive evidence of infection.  The result     should be evaluated along with physical examination and other     diagnostic findings.                                                                                               **Normal Reference Range: Negative**          Assay performed using the Gen-Probe APTIMA COMBO2 (R) Assay.     Acceptable specimen types for this assay include APTIMA Swabs (Unisex,     endocervical, urethral, or vaginal), first void urine, and ThinPrep     liquid based cytology samples.     Performed at [facility]  WET PREP, GENITAL     Status: Abnormal   Collection Time    03/12/14 12:26 PM      Result Value Ref Range Status   Yeast Wet Prep HPF POC NONE SEEN  NONE SEEN Final   Trich, Wet Prep NONE SEEN  NONE SEEN Final   Clue Cells Wet Prep HPF POC FEW (*) NONE SEEN Final   WBC, Wet Prep HPF POC FEW (*) NONE SEEN Final  URINE CULTURE     Status: None   Collection Time    03/12/14  6:58 PM      Result Value Ref Range Status   Specimen Description URINE, CLEAN CATCH   Final   Special Requests NONE   Final   Culture  Setup Time     Final   Value: 03/13/2014 01:16     Performed at [facility]   Colony Count     Final   Value: 20,OOO COLONIES/ML     Performed at [facility]   Culture     Final   Value: Multiple bacterial morphotypes  present, none predominant. Suggest appropriate recollection if clinically indicated.     Performed at [facility]    Report Status 03/14/2014 FINAL   Final    Cliffton Asters, MD [facility] [phone] pager   [phone] cell 03/21/2014, 4:48 PM

## 2014-03-21 NOTE — ED Notes (Signed)
carelink here to provide safe transport to [facility].  NAD upon leaving dept.

## 2014-03-21 NOTE — ED Provider Notes (Signed)
9:36 AM Accepted care from Dr. Rhunette CroftNanavati. Dr. Davina Pokeornet ([facility]) evaluated the pt. Suspects her sx to be related to her PID as she had recent pos cultures for GC/Chl. I consulted Obgyn who would [facility] I notified Dr. Davina Pokeornet who is ok w/ Obgyn assuming care. [facility] Pt made aware of plan.   Medications given during this visit Medications  morphine 4 MG/ML injection 4 mg (4 mg Intravenous Given 03/21/14 0342)  ondansetron (ZOFRAN) injection 4 mg (4 mg Intravenous Given 03/21/14 0341)  sodium chloride 0.9 % bolus 1,000 mL (0 mLs Intravenous Stopped 03/21/14 0739)  metroNIDAZOLE (FLAGYL) IVPB 500 mg (0 mg Intravenous Stopped 03/21/14 0820)  ciprofloxacin (CIPRO) IVPB 400 mg (0 mg Intravenous Stopped 03/21/14 0919)  morphine 4 MG/ML injection 4 mg (4 mg Intravenous Given 03/21/14 0702)  sodium chloride 0.9 % bolus 1,000 mL (1,000 mLs Intravenous New Bag/Given 03/21/14 0738)  iohexol (OMNIPAQUE) 300 MG/ML solution 100 mL (100 mLs Intravenous Contrast Given 03/21/14 0748)  diphenhydrAMINE (BENADRYL) injection 25 mg (25 mg Intravenous Given 03/21/14 0930)    New Prescriptions   No medications on file     Clinical Impression 1. Abdominal pain   2. Peritonitis   3. Abdominal abscess   4. Elevated lipase      Junius ArgyleForrest S Makylah Bossard, MD 03/21/14 [phone]

## 2014-03-21 NOTE — ED Notes (Signed)
Initial Contact - pt ambulating to void in BR, steady gait.  Pt reports inc in pain with ambulation, decreased with rest.  Pt reports 4/10 pain at this time, reports improved from previously.  Pt denies other needs/complaints.  Pt aware of [facility].  Skin PWD.  MAEI.  Speaking full/clear sentences, rr even/un-lab.  Abd sl distended, ttp.  NAD.

## 2014-03-21 NOTE — ED Notes (Signed)
Pt c/o abd pain, pt states she was discharged from the facility Monday for same. Pt states she only has 2 Hydrocodone left and can not function without them. Pt did not get nausea medication filled. Pt did follow up with PCP.

## 2014-03-22 LAB — CBC WITH DIFFERENTIAL/PLATELET
BASOS PCT: 0 % (ref 0–1)
Basophils Absolute: 0 10*3/uL (ref 0.0–0.1)
EOS ABS: 0.2 10*3/uL (ref 0.0–0.7)
Eosinophils Relative: 2 % (ref 0–5)
HEMATOCRIT: 35.2 % — AB (ref 36.0–46.0)
HEMOGLOBIN: 12.5 g/dL (ref 12.0–15.0)
LYMPHS ABS: 3.1 10*3/uL (ref 0.7–4.0)
Lymphocytes Relative: 32 % (ref 12–46)
MCH: 29.3 pg (ref 26.0–34.0)
MCHC: 35.5 g/dL (ref 30.0–36.0)
MCV: 82.6 fL (ref 78.0–100.0)
MONO ABS: 0.6 10*3/uL (ref 0.1–1.0)
MONOS PCT: 6 % (ref 3–12)
Neutro Abs: 5.8 10*3/uL (ref 1.7–7.7)
Neutrophils Relative %: 60 % (ref 43–77)
Platelets: 431 10*3/uL — ABNORMAL HIGH (ref 150–400)
RBC: 4.26 MIL/uL (ref 3.87–5.11)
RDW: 15 % (ref 11.5–15.5)
WBC: 9.7 10*3/uL (ref 4.0–10.5)

## 2014-03-22 LAB — COMPREHENSIVE METABOLIC PANEL
ALBUMIN: 3 g/dL — AB (ref 3.5–5.2)
ALK PHOS: 41 U/L (ref 39–117)
ALT: 9 U/L (ref 0–35)
AST: 18 U/L (ref 0–37)
BUN: 5 mg/dL — ABNORMAL LOW (ref 6–23)
CALCIUM: 8.6 mg/dL (ref 8.4–10.5)
CO2: 31 mEq/L (ref 19–32)
Chloride: 101 mEq/L (ref 96–112)
Creatinine, Ser: 0.8 mg/dL (ref 0.50–1.10)
GFR calc Af Amer: >90 mL/min (ref >90)
GFR calc non Af Amer: >90 mL/min (ref >90)
Glucose, Bld: 80 mg/dL (ref 70–99)
Potassium: 3.5 mEq/L — ABNORMAL LOW (ref 3.7–5.3)
SODIUM: 142 meq/L (ref 137–147)
TOTAL PROTEIN: 6.3 g/dL (ref 6.0–8.3)
Total Bilirubin: 0.4 mg/dL (ref 0.3–1.2)

## 2014-03-22 LAB — AMYLASE: Amylase: 94 U/L (ref 0–105)

## 2014-03-22 LAB — LIPASE, BLOOD: Lipase: 110 U/L — ABNORMAL HIGH (ref 11–59)

## 2014-03-22 LAB — RPR

## 2014-03-22 LAB — HIV ANTIBODY (ROUTINE TESTING W REFLEX): HIV 1&2 Ab, 4th Generation: NONREACTIVE

## 2014-03-22 NOTE — Progress Notes (Addendum)
Heather Lane is [age] y.o.  [id number]  Hospital  Day #1  Subjective: Patient is doing better, by her report.  Pain is controlled, ambulating, tolerated regular diet and voiding without diffidulty. Patient has not had a BM but has passed flatus.,   Objective: Vital signs in last 24 hours: Temp:  [97.8 F (36.6 C)-98.6 F (37 C)] 98.5 F (36.9 C) (04/20 0451) Pulse Rate:  [55-77] 63 (04/20 0451) Resp:  [11-19] 16 (04/20 0522) BP: (98-124)/(55-71) 111/71 mmHg (04/20 0451) SpO2:  [98 %-100 %] 100 % (04/20 0522) Weight:  [117 lb 8.1 oz (53.3 kg)] 117 lb 8.1 oz (53.3 kg) (04/20 0451)  Intake/Output from previous day: 04/19 0701 - 04/20 0700 In: 605 [P.O.:605] Out: 2400 [Urine:2400] Intake/Output this shift:    Recent Labs Lab 03/21/14 0336 03/22/14 0515  WBC 12.9* 9.7  HGB 13.8 12.5  HCT 37.2 35.2*  PLT 487* 431*     Recent Labs Lab 03/21/14 0336 03/22/14 0515  NA 141 142  K 3.4* 3.5*  CL 102 101  CO2 27 31  BUN 12 5*  CREATININE 0.72 0.80  CALCIUM 9.5 8.6  PROT 7.5 6.3  BILITOT 0.3 0.4  ALKPHOS 55 41  ALT 12 9  AST 18 18  GLUCOSE 100* 80    EXAM: General: Sleepy, cooperative and without distress. Resp: clear to auscultation bilaterally Cardio: regular rate and rhythm, S1, S2 normal, no murmur, click, rub or gallop GI: Bowel sounds present, diffusely tender with voluntary guarding but no rebound. Extremities: SCD hose in place and functioning with no calf tenderness.   Assessment: Pelvic Abscess-clinically improved  Plan: Routine care  LOS: 1 day    Henreitta Leberlmira Powell, PA-C 03/22/2014 7:50 AM   Seen. Reports much better than yesterday. Findings reviewed with patient and questions answered. Still planning for repeat CT-Scan on 03/24/14.  Abdomen: soft, non-distended. Tender low pelvis and LLQ. No guarding Extremities: negative  Continue with current plan of care

## 2014-03-22 NOTE — Progress Notes (Signed)
Patient ID: Heather Lane, female   DOB: May 10, 1991, 23 y.o.   MRN: [id number]         [facility]    Date of Admission:  03/21/2014   Total days of antibiotics 11        Day 2 piperacillin tazobactam         Principal Problem:   Pelvic abscess in female Active Problems:   Gonorrhea   Chlamydia   History of herpes genitalis   . docusate sodium  100 mg Oral BID  . HYDROmorphone PCA 0.3 mg/mL   Intravenous 6 times per day  . ketorolac  30 mg Intravenous 4 times per day   Or  . ketorolac  30 mg Intramuscular 4 times per day  . piperacillin-tazobactam (ZOSYN)  IV  3.375 g Intravenous Q8H  . prenatal multivitamin  1 tablet Oral Q1200    Subjective: She is feeling better today. She feels like her pain medication is working better. She currently has no pain on her lower gum pain has been up to 4/10 today. Her pain is worse when she is up walking or if she tries to eat. She had one brief episode of nausea this morning without vomiting. She no longer feels constipated. She did have some mild chills and sweats last night.  Review of Systems: Pertinent items are noted in HPI.  History reviewed. No pertinent past medical history.  History  Substance Use Topics  . Smoking status: Former Smoker -- 1.00 packs/day    Types: Cigarettes    Quit date: 03/15/2014  . Smokeless tobacco: Never Used  . Alcohol Use: 1.8 oz/week    3 Glasses of wine per week    Family History  Problem Relation Age of Onset  . Diabetes Sister   . Diabetes Maternal Aunt   . Hypertension Maternal Aunt   . Diabetes Maternal Uncle   . Hypertension Maternal Uncle     No Known Allergies  Objective: Temp:  [97.8 F (36.6 C)-98.6 F (37 C)] 98.6 F (37 C) (04/20 1000) Pulse Rate:  [55-74] 70 (04/20 1000) Resp:  [11-19] 15 (04/20 1000) BP: (98-124)/(55-71) 108/65 mmHg (04/20 1000) SpO2:  [99 %-100 %] 100 % (04/20 1000) Weight:  [53.3 kg (117 lb 8.1 oz)] 53.3 kg (117 lb 8.1 oz)  (04/20 0451)  General: She appears comfortable and in no distress Skin: No rash Lungs: Clear Cor: Regular S1 and S2 no murmur Abdomen: Soft without palpable masses. She has some lower quadrant tenderness with palpation. She has quiet bowel sounds  Lab Results Lab Results  Component Value Date   WBC 9.7 03/22/2014   HGB 12.5 03/22/2014   HCT 35.2* 03/22/2014   MCV 82.6 03/22/2014   PLT 431* 03/22/2014    Lab Results  Component Value Date   CREATININE 0.80 03/22/2014   BUN 5* 03/22/2014   NA 142 03/22/2014   K 3.5* 03/22/2014   CL 101 03/22/2014   CO2 31 03/22/2014    Lab Results  Component Value Date   ALT 9 03/22/2014   AST 18 03/22/2014   ALKPHOS 41 03/22/2014   BILITOT 0.4 03/22/2014      Microbiology: Recent Results (from the past 240 hour(s))  URINE CULTURE     Status: None   Collection Time    03/12/14  6:58 PM      Result Value Ref Range Status   Specimen Description URINE, CLEAN CATCH   Final   Special Requests NONE  Final   Culture  Setup Time     Final   Value: 03/13/2014 01:16     Performed at [facility] Count     Final   Value: 20,OOO COLONIES/ML     Performed at [facility]   Culture     Final   Value: Multiple bacterial morphotypes present, none predominant. Suggest appropriate recollection if clinically indicated.     Performed at [facility]   Report Status 03/14/2014 FINAL   Final    Studies/Results: US Abdomen Complete  03/21/2014   CLINICAL DATA:  Right upper quadrant abdominal pain.  EXAM: ULTRASOUND ABDOMEN COMPLETE  COMPARISON:  CT of same date and 03/12/2014.  FINDINGS: Gallbladder:  Gallbladder distention. Sludge within. No wall thickening or pericholecystic fluid. Sonographic Murphy's sign was not elicited.  Common bile duct:  Diameter: Dilated, at 8 mm.  Liver:  No focal lesion identified. Within normal limits in parenchymal echogenicity.  IVC:  No abnormality visualized.  Pancreas:  Visualized portion  unremarkable.  Spleen:  Size and appearance within normal limits.  Right Kidney:  Length: 10.9 cm. Echogenicity within normal limits. No mass or hydronephrosis visualized.  Left Kidney:  Length: 10.0 cm. Echogenicity within normal limits. No mass or hydronephrosis visualized.  Abdominal aorta:  No aneurysm visualized.  Other findings:  Trace ascites adjacent the liver.  IMPRESSION: 1. Gallbladder sludge and gallbladder distention. No evidence of acute cholecystitis. 2. Common duct dilatation, 8 mm. If bilirubin is elevated, MRCP should be considered to exclude choledocholithiasis. 3. Trace perihepatic ascites.   Electronically Signed   By: Jeronimo Greaves M.D.   On: 03/21/2014 09:03   Ct Abdomen Pelvis W Contrast  03/21/2014   CLINICAL DATA:  Right-sided abdominal pain.  EXAM: CT ABDOMEN AND PELVIS WITH CONTRAST  TECHNIQUE: Multidetector CT imaging of the abdomen and pelvis was performed using the standard protocol following bolus administration of intravenous contrast.  CONTRAST:  OMNIPAQUE IOHEXOL 300 MG/ML  SOLN  COMPARISON:  03/12/2014  FINDINGS: Lower Chest: Clear lung bases. Normal heart size without pericardial or pleural effusion.  Abdomen/Pelvis: Mild hepatic steatosis. Normal spleen, stomach, pancreas. Normal gallbladder. Common duct upper normal to minimally dilated at 7 mm on image 23. No obstructive stone identified.  Normal adrenal glands and kidneys. No retroperitoneal or retrocrural adenopathy. Large colonic stool burden, especially in the cecum. Appendix is air-filled and normal on image 47. The terminal ileum is not well visualized. May be identified and thickened on image 52. There is a paucity of abdominal and pelvic fat. Normal caliber of small bowel loops. Development of a peripherally enhancing fluid collection anterior to the uterus and posterior to the urinary bladder. 1.4 x 6.2 cm on image 57. Adjacent and possibly contiguous cul-de-sac peripherally enhancing fluid collection of 6.3 x  4.1 cm on image 59.  No pelvic adenopathy. Normal urinary bladder. No air within the bladder. Normal uterus. No dominant adnexal mass.  Bones/Musculoskeletal:  S-shaped lumbar spine curvature.  IMPRESSION: 1. Pelvic fluid collection/collections, highly suspicious for abscesses. 2. Suspicion of distal/terminal ileitis. This could represent Crohn disease or infectious enteritis. 3.  Possible constipation. 4. Mild common duct dilatation. Consider correlation with bilirubin levels. If these are elevated, MRCP should be considered. 5. Normal appendix   Electronically Signed   By: Jeronimo Greaves M.D.   On: 03/21/2014 08:34    Assessment: Her abdominal pain from her pelvic abscess is under better control.   Plan: 1. Continue piperacillin tazobactam  2. I will follow up tomorrow  Cliffton AstersJohn Rosibel Giacobbe, MD [facility] [phone] pager   [phone] cell 03/22/2014, 12:40 PM

## 2014-03-22 NOTE — Progress Notes (Signed)
Ur chart review completed.  
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
## 2014-03-23 LAB — COMPREHENSIVE METABOLIC PANEL
ALK PHOS: 48 U/L (ref 39–117)
ALT: 11 U/L (ref 0–35)
AST: 21 U/L (ref 0–37)
Albumin: 3.1 g/dL — ABNORMAL LOW (ref 3.5–5.2)
BUN: 5 mg/dL — AB (ref 6–23)
CO2: 30 meq/L (ref 19–32)
Calcium: 8.8 mg/dL (ref 8.4–10.5)
Chloride: 99 mEq/L (ref 96–112)
Creatinine, Ser: 0.8 mg/dL (ref 0.50–1.10)
GFR calc Af Amer: >90 mL/min (ref >90)
Glucose, Bld: 81 mg/dL (ref 70–99)
POTASSIUM: 3.7 meq/L (ref 3.7–5.3)
SODIUM: 140 meq/L (ref 137–147)
Total Bilirubin: 0.4 mg/dL (ref 0.3–1.2)
Total Protein: 6.4 g/dL (ref 6.0–8.3)

## 2014-03-23 LAB — CBC WITH DIFFERENTIAL/PLATELET
Basophils Absolute: 0 10*3/uL (ref 0.0–0.1)
Basophils Relative: 0 % (ref 0–1)
EOS ABS: 0.3 10*3/uL (ref 0.0–0.7)
EOS PCT: 3 % (ref 0–5)
HCT: 35.4 % — ABNORMAL LOW (ref 36.0–46.0)
HEMOGLOBIN: 13 g/dL (ref 12.0–15.0)
LYMPHS ABS: 2.4 10*3/uL (ref 0.7–4.0)
LYMPHS PCT: 26 % (ref 12–46)
MCH: 30.1 pg (ref 26.0–34.0)
MCHC: 36.7 g/dL — ABNORMAL HIGH (ref 30.0–36.0)
MCV: 81.9 fL (ref 78.0–100.0)
Monocytes Absolute: 0.4 10*3/uL (ref 0.1–1.0)
Monocytes Relative: 4 % (ref 3–12)
NEUTROS PCT: 67 % (ref 43–77)
Neutro Abs: 6.4 10*3/uL (ref 1.7–7.7)
PLATELETS: 462 10*3/uL — AB (ref 150–400)
RBC: 4.32 MIL/uL (ref 3.87–5.11)
RDW: 14.7 % (ref 11.5–15.5)
WBC: 9.5 10*3/uL (ref 4.0–10.5)

## 2014-03-23 LAB — LIPASE, BLOOD: Lipase: 140 U/L — ABNORMAL HIGH (ref 11–59)

## 2014-03-23 MED ORDER — HYDROCODONE-ACETAMINOPHEN 5-325 MG PO TABS
1.0000 | ORAL_TABLET | ORAL | Status: DC | PRN
  Administered 2014-03-23 – 2014-03-24 (×2): 1 via ORAL
  Administered 2014-03-24: 2 via ORAL
  Filled 2014-03-23: qty 1
  Filled 2014-03-23: qty 2
  Filled 2014-03-23: qty 1

## 2014-03-23 MED ORDER — ONDANSETRON HCL 4 MG PO TABS
4.0000 mg | ORAL_TABLET | Freq: Three times a day (TID) | ORAL | Status: DC | PRN
  Filled 2014-03-23: qty 1

## 2014-03-23 NOTE — Progress Notes (Signed)
[facility] requesting that Nigel BridgemanVicki Latham call CSW regarding patient starting an antidepressant.  (Patient specifically asked CSW yesterday that CSW speak with Manfred ArchV. Latham).  CSW checked in briefly with patient to see how she was doing.  She states she is ok and has no needs at this time.

## 2014-03-23 NOTE — Progress Notes (Signed)
Patient ID: Heather Lane, female   DOB: [birth year], 23 y.o.   MRN: [id number]         [facility]    Date of Admission:  03/21/2014   Total days of antibiotics 12        Day 3 piperacillin tazobactam         Principal Problem:   Pelvic abscess in female Active Problems:   Gonorrhea   Chlamydia   History of herpes genitalis   . docusate sodium  100 mg Oral BID  . ketorolac  30 mg Intravenous 4 times per day   Or  . ketorolac  30 mg Intramuscular 4 times per day  . piperacillin-tazobactam (ZOSYN)  IV  3.375 g Intravenous Q8H  . prenatal multivitamin  1 tablet Oral Q1200    Subjective: She is feeling much better. Her pain is under better control and she is now on oral pain medication. She is eating a little bit more today.  Objective: Temp:  [97.4 F (36.3 C)-98.7 F (37.1 C)] 98.3 F (36.8 C) (04/21 0952) Pulse Rate:  [64-86] 86 (04/21 0952) Resp:  [11-18] 16 (04/21 0952) BP: (93-133)/(49-74) 133/68 mmHg (04/21 0952) SpO2:  [95 %-100 %] 97 % (04/21 0952) Weight:  [114 lb 8 oz (51.937 kg)] 114 lb 8 oz (51.937 kg) (04/21 0457)  General: She appears comfortable and in no distress Lungs: Clear Cor: Regular S1 and S2 no murmur Abdomen: Soft without palpable masses. She has some lower quadrant tenderness with palpation.   Lab Results Lab Results  Component Value Date   WBC 9.5 03/23/2014   HGB 13.0 03/23/2014   HCT 35.4* 03/23/2014   MCV 81.9 03/23/2014   PLT 462* 03/23/2014    Lab Results  Component Value Date   CREATININE 0.80 03/23/2014   BUN 5* 03/23/2014   NA 140 03/23/2014   K 3.7 03/23/2014   CL 99 03/23/2014   CO2 30 03/23/2014    Lab Results  Component Value Date   ALT 11 03/23/2014   AST 21 03/23/2014   ALKPHOS 48 03/23/2014   BILITOT 0.4 03/23/2014      Microbiology: No results found for this or any previous visit (from the past 240 hour(s)).  Studies/Results: No results found.  Assessment: She is improving on broad  empiric antibiotic therapy for pelvic abscess. 2 schedule for repeat CT scan tomorrow. If he continues to show clinical and radiographic improvement I would consider switching her to oral ciprofloxacin 750 mg twice daily and Augmentin 875 mg twice daily and treat for several more weeks.   Plan: 1. Continue piperacillin tazobactam for now 2. I will follow up tomorrow after reviewing CAT scan results  Cliffton AstersJohn Natascha Edmonds, MD [facility] [phone] pager   [phone] cell 03/23/2014, 1:07 PM

## 2014-03-23 NOTE — Progress Notes (Signed)
Helene KelpLachelle Kozar is [age].o.  [id number]  Hospital Day # 2  Subjective: Patient is continuing to improve though she expresses pelvic discomfort when she ambulates. Patient has minimal pain in general, continues to tolerate regular diet, ambulates and voids without difficulty., Has passed flatus but has not had a BM.  Objective: Vital signs in last 24 hours: Temp:  [97.4 F (36.3 C)-98.7 F (37.1 C)] 98.1 F (36.7 C) (04/21 0457) Pulse Rate:  [64-83] 71 (04/21 0457) Resp:  [11-18] 11 (04/21 0506) BP: (93-115)/(49-74) 109/65 mmHg (04/21 0457) SpO2:  [100 %] 100 % (04/21 0506) Weight:  [114 lb 8 oz (51.937 kg)] 114 lb 8 oz (51.937 kg) (04/21 0457)  Intake/Output from previous day: 04/20 0701 - 04/21 0700 In: 2840 [P.O.:1040; I.V.:1800] Out: 5175 [Urine:5175] Intake/Output this shift:    Recent Labs Lab 03/21/14 0336 03/22/14 0515 03/23/14 0525  WBC 12.9* 9.7 9.5  HGB 13.8 12.5 13.0  HCT 37.2 35.2* 35.4*  PLT 487* 431* 462*     Recent Labs Lab 03/21/14 0336 03/22/14 0515 03/23/14 0525  NA 141 142 140  K 3.4* 3.5* 3.7  CL 102 101 99  CO2 27 31 30   BUN 12 5* 5*  CREATININE 0.72 0.80 0.80  CALCIUM 9.5 8.6 8.8  PROT 7.5 6.3 6.4  BILITOT 0.3 0.4 0.4  ALKPHOS 55 41 48  ALT 12 9 11   AST 18 18 21   GLUCOSE 100* 80 81    EXAM: General: alert and no distress Resp: clear to auscultation bilaterally Cardio: regular rate and rhythm, S1, S2 normal, no murmur, click, rub or gallop GI: Bowel sounds present, no tenderness to palpation (patient was tickllish)  Extremities: No calf tenderness and Homan's negative.   Assessment: Pelvic Pain/Pelvic Abscess : progressing well  Plan: Routine Care  LOS: 2 days    Henreitta Leberlmira Powell, PA-C 03/23/2014 7:29 AM  Agree with above.  Plan for repeat ct scan tomorrow.  Pt is still a little tender today on exam with palpation.  No rebound or guarding.  Continue zosyn.  Would appreciate recs from Dr. Orvan Falconerampbell on po antibiotics and duration  of tx for pt when pt is well enough for discharge home.

## 2014-03-24 ENCOUNTER — Inpatient Hospital Stay (HOSPITAL_COMMUNITY): Payer: Medicaid Other

## 2014-03-24 ENCOUNTER — Encounter (HOSPITAL_COMMUNITY): Payer: Self-pay | Admitting: Physician Assistant

## 2014-03-24 DIAGNOSIS — R748 Abnormal levels of other serum enzymes: Secondary | ICD-10-CM

## 2014-03-24 DIAGNOSIS — N731 Chronic parametritis and pelvic cellulitis: Secondary | ICD-10-CM

## 2014-03-24 DIAGNOSIS — R933 Abnormal findings on diagnostic imaging of other parts of digestive tract: Secondary | ICD-10-CM

## 2014-03-24 DIAGNOSIS — B1920 Unspecified viral hepatitis C without hepatic coma: Secondary | ICD-10-CM

## 2014-03-24 DIAGNOSIS — R109 Unspecified abdominal pain: Secondary | ICD-10-CM

## 2014-03-24 LAB — CBC WITH DIFFERENTIAL/PLATELET
BASOS ABS: 0 10*3/uL (ref 0.0–0.1)
BASOS PCT: 0 % (ref 0–1)
Eosinophils Absolute: 0.3 10*3/uL (ref 0.0–0.7)
Eosinophils Relative: 2 % (ref 0–5)
HCT: 36.1 % (ref 36.0–46.0)
Hemoglobin: 13.3 g/dL (ref 12.0–15.0)
LYMPHS PCT: 19 % (ref 12–46)
Lymphs Abs: 2.1 10*3/uL (ref 0.7–4.0)
MCH: 30.3 pg (ref 26.0–34.0)
MCHC: 36.8 g/dL — AB (ref 30.0–36.0)
MCV: 82.2 fL (ref 78.0–100.0)
Monocytes Absolute: 0.5 10*3/uL (ref 0.1–1.0)
Monocytes Relative: 5 % (ref 3–12)
NEUTROS ABS: 7.9 10*3/uL — AB (ref 1.7–7.7)
NEUTROS PCT: 73 % (ref 43–77)
PLATELETS: 465 10*3/uL — AB (ref 150–400)
RBC: 4.39 MIL/uL (ref 3.87–5.11)
RDW: 14.8 % (ref 11.5–15.5)
WBC: 10.8 10*3/uL — AB (ref 4.0–10.5)

## 2014-03-24 LAB — LIPASE, BLOOD: LIPASE: 152 U/L — AB (ref 11–59)

## 2014-03-24 MED ORDER — CIPROFLOXACIN HCL 750 MG PO TABS
750.0000 mg | ORAL_TABLET | Freq: Two times a day (BID) | ORAL | Status: DC
  Administered 2014-03-24 – 2014-03-25 (×2): 750 mg via ORAL
  Filled 2014-03-24 (×2): qty 1

## 2014-03-24 MED ORDER — AMOXICILLIN-POT CLAVULANATE 875-125 MG PO TABS
1.0000 | ORAL_TABLET | Freq: Two times a day (BID) | ORAL | Status: DC
  Administered 2014-03-24 – 2014-03-25 (×2): 1 via ORAL
  Filled 2014-03-24 (×2): qty 1

## 2014-03-24 MED ORDER — IOHEXOL 300 MG/ML  SOLN: 100.0000 mL | Freq: Once | INTRAMUSCULAR | Status: AC | PRN

## 2014-03-24 MED ORDER — SERTRALINE HCL 50 MG PO TABS
50.0000 mg | ORAL_TABLET | Freq: Every day | ORAL | Status: DC
  Administered 2014-03-24 – 2014-03-25 (×2): 50 mg via ORAL
  Filled 2014-03-24 (×2): qty 1

## 2014-03-24 MED ORDER — IOHEXOL 300 MG/ML  SOLN
100.0000 mL | Freq: Once | INTRAMUSCULAR | Status: AC | PRN
  Administered 2014-03-24: 100 mL via INTRAVENOUS

## 2014-03-24 NOTE — Progress Notes (Signed)
ANTIBIOTIC CONSULT NOTE -  Pharmacy Consult for Zosyn Indication: Tubo-Ovarian Abscess  No Known Allergies  Patient Measurements: Height: 5\' 5"  (165.1 cm) Weight: 115 lb (52.164 kg) (Standing scale) IBW/kg (Calculated) : 57 kg  Vital Signs: Temp: 97.4 F (36.3 C) (04/22 0957) Temp src: Oral (04/22 0957) BP: 110/64 mmHg (04/22 0957) Pulse Rate: 77 (04/22 0957) Intake/Output from previous day: 04/21 0701 - 04/22 0700 In: 4852.1 [P.O.:1230; I.V.:3345.3; IV Piggyback:276.9] Out: 5550 [Urine:5550] Intake/Output from this shift: Total I/O In: -  Out: 800 [Urine:800]  Labs:  Recent Labs  03/22/14 0515 03/23/14 0525 03/24/14 0817  WBC 9.7 9.5 10.8*  HGB 12.5 13.0 13.3  PLT 431* 462* 465*  CREATININE 0.80 0.80  --    Estimated Creatinine Clearance: 91.7 ml/min (by C-G formula based on Cr of 0.8).    Microbiology: Recent Results (from the past 720 hour(s))  GC/CHLAMYDIA PROBE AMP     Status: Abnormal   Collection Time    03/12/14 12:26 PM      Result Value Ref Range Status   CT Probe RNA POSITIVE (*) NEGATIVE Final   Comment: (NOTE)     A Positive CT or NG Nucleic Acid Amplification Test (NAAT) result     should be considered presumptive evidence of infection.  The result     should be evaluated along with physical examination and other     diagnostic findings.   GC Probe RNA POSITIVE (*) NEGATIVE Final   Comment: (NOTE)     A Positive CT or NG Nucleic Acid Amplification Test (NAAT) result     should be considered presumptive evidence of infection.  The result     should be evaluated along with physical examination and other     diagnostic findings.                                                                                               **Normal Reference Range: Negative**          Assay performed using the Gen-Probe APTIMA COMBO2 (R) Assay.     Acceptable specimen types for this assay include APTIMA Swabs (Unisex,     endocervical, urethral, or vaginal),  first void urine, and ThinPrep     liquid based cytology samples.     Performed at [facility]  WET PREP, GENITAL     Status: Abnormal   Collection Time    03/12/14 12:26 PM      Result Value Ref Range Status   Yeast Wet Prep HPF POC NONE SEEN  NONE SEEN Final   Trich, Wet Prep NONE SEEN  NONE SEEN Final   Clue Cells Wet Prep HPF POC FEW (*) NONE SEEN Final   WBC, Wet Prep HPF POC FEW (*) NONE SEEN Final  URINE CULTURE     Status: None   Collection Time    03/12/14  6:58 PM      Result Value Ref Range Status   Specimen Description URINE, CLEAN CATCH   Final   Special Requests NONE   Final   Culture  Setup Time  Final   Value: 03/13/2014 01:16     Performed at [facility]   Colony Count     Final   Value: 20,OOO COLONIES/ML     Performed at [facility]   Culture     Final   Value: Multiple bacterial morphotypes present, none predominant. Suggest appropriate recollection if clinically indicated.     Performed at [facility]   Report Status 03/14/2014 FINAL   Final    Medical History: History reviewed. No pertinent past medical history.  Medications:  Anti-infectives   Start     Dose/Rate Route Frequency Ordered Stop   03/21/14 1800  piperacillin-tazobactam (ZOSYN) IVPB 3.375 g     3.375 g 12.5 mL/hr over 240 Minutes Intravenous Every 8 hours 03/21/14 1725     03/21/14 1500  cefoTEtan (CEFOTAN) 2 g in dextrose 5 % 50 mL IVPB  Status:  Discontinued     2 g 100 mL/hr over 30 Minutes Intravenous Every 12 hours 03/21/14 1432 03/21/14 1658   03/21/14 1500  doxycycline (VIBRA-TABS) tablet 100 mg  Status:  Discontinued     100 mg Oral Every 12 hours 03/21/14 1432 03/21/14 1658   03/21/14 0700  metroNIDAZOLE (FLAGYL) IVPB 500 mg     500 mg 100 mL/hr over 60 Minutes Intravenous  Once 03/21/14 0647 03/21/14 0820   03/21/14 0700  ciprofloxacin (CIPRO) IVPB 400 mg     400 mg 200 mL/hr over 60 Minutes Intravenous  Once 03/21/14 0647 03/21/14 0919      Assessment: Day 3 of zosyn.  Pt is afebrile.  Pain stable; pt to have repeat CT scan today to evaluate abscess.  Plan:  Continue Zosyn 3.375 grams IV Q 8 hr  Natasha BenceJulie Chasen Mendell 03/24/2014,11:48 AM

## 2014-03-24 NOTE — Consult Note (Signed)
[facility] Consult: 3:36 PM 03/24/2014  LOS: 3 days    Referring Provider: Dr Osborn CohoAngela Roberts  Primary Care Physician:  None.  No health insurance.  Primary Gastroenterologist:  Gentry FitzUnassigned.     Reason for Consultation:  Rising Lipase.    HPI: Heather KelpLachelle Herrington is a 23 y.o. female.  S/p C section 2012.  Papers from then note she was Hep C positive (do not see hepatitis tests in Epic), Hx HSV 1 and chlamydia infections.   Admitted 03/12/14 - 03/15/14 with PID and ? enteritis (N/V, minor loose and non-bloody stool, crampy abdominal pain for about one week before presenting to ED with disabling intense lower abdominal pain). WBCs to 22K.  CT scan showed enteritis and mesenteric adenitis.  GC probe +, chlamydia + on wet prep.  Sent home with 6 remaining days of Doxycycline after initial treatment with Cipro/Flagyl then single dose of Rocephin.    Readmitted 03/21/14 to Gyn service with ongoing pain, significant post prandial intensification.  CT repeated twice and showing pelvic abscesses (large bilobed) and thickened SB.  GB normal but on CT of 4/19 the CBD id upper limits normal at 7mm. . The distal/terminal ileal thickening raises suspicion of Crohn's.  4/19 Ultrasound does show GB sludge and distention, 8 mm CBD, trace peri-hepatic ascites.  4/22 CT shows no SB disease, possible secondary inflammatory changes in the sigmoid colon.    ID, Dr  Orvan Falconerampbell, has outlined course of Abx, currently on Zosyn.   No organism has been identified from the abscess (no perc drain or abscess drainage), no blood cultures.  Pain significantly improved with narcotics (by mouth currently) and Toradol and will possibly discharge tomorrow with several weeks of Cipro and Augmentin. Mora Appl.  Started on Zoloft for depression (not a new issue). She never  had much diarrhea and had only 2 stools in interim between 4/13 and 4/19.  Norm is formed BMs QOD. Still has some early satiety and po still slightly elevates pain level but much better than upon admission.     MD asks for GI input into elevated Lipase.  4/10:   16 4/19:    250 4/20:    110 4/21:     140 4/22:     152  LFTs unremarkable.  Consumes 750 ml pink moscatto wine every 3 days.      Past Medical History  Diagnosis Date  . Hepatitis C antibody test positive     this is written in admission paperwork in 2012.  needs to be confirmed with blood tests.   . Chlamydia trachomatis infection of genitourinary sites 2009  . HSV-1 (herpes simplex virus 1) infection   . Depression     Past Surgical History  Procedure Laterality Date  . Cesarean section  05/2011    Prior to Admission medications   Medication Sig Start Date End Date Taking? Authorizing Provider  doxycycline (VIBRA-TABS) 100 MG tablet Take 1 tablet (100 mg total) by mouth every 12 (twelve) hours. For 6 days 03/15/14  Yes Estela Isaiah BlakesY Hernandez Acosta, MD  HYDROcodone-acetaminophen (  NORCO/VICODIN) 5-325 MG per tablet Take 1-2 tablets by mouth every 4 (four) hours as needed for moderate pain. 03/15/14  Yes Estela Isaiah Blakes, MD  ibuprofen (ADVIL,MOTRIN) 200 MG tablet Take 200 mg by mouth every 6 (six) hours as needed for cramping.   Yes Historical Provider, MD  tetrahydrozoline-zinc (VISINE-AC) 0.05-0.25 % ophthalmic solution Place 2 drops into both eyes 3 (three) times daily as needed (for itchy eyes).   Yes Historical Provider, MD    Scheduled Meds: . docusate sodium  100 mg Oral BID  . ketorolac  30 mg Intravenous 4 times per day   Or  . ketorolac  30 mg Intramuscular 4 times per day  . piperacillin-tazobactam (ZOSYN)  IV  3.375 g Intravenous Q8H  . prenatal multivitamin  1 tablet Oral Q1200  . sertraline  50 mg Oral Daily   Infusions: . lactated ringers 150 mL/hr at 03/24/14 1051   PRN  Meds: diphenhydrAMINE, HYDROcodone-acetaminophen, iohexol, ondansetron, ondansetron, zolpidem   Allergies as of 03/21/2014  . (No Known Allergies)    Family History  Problem Relation Age of Onset  . Diabetes Sister   . Diabetes Maternal Aunt   . Hypertension Maternal Aunt   . Diabetes Maternal Uncle   . Hypertension Maternal Uncle         No hx IBD or colorectal cancer.   History   Social History  . Marital Status: Single    Spouse Name: N/A    Number of Children: N/A  . 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    Social History Main Topics  . Smoking status: Former Smoker -- 1.00 packs/day    Types: Cigarettes    Quit date: 03/15/2014  . Smokeless tobacco: Never Used  . Alcohol Use: 1.8 oz/week    3 Glasses of wine per week  . Drug Use: Yes    Special: Marijuana  . Sexual Activity: Yes    Partners: Male    Birth Control/ Protection: None   Other Topics Concern  . Not on file   Social History Narrative  . 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  Archivist at [employer], business related course work.     REVIEW OF SYSTEMS: Constitutional:  Weight increasing since February 2015 when her appetite improved.  Was 128 # on first admission this month, down to 114# now. ENT:  No nose bleeds Pulm:  No cough or dyspnea CV:  No palpitations, no LE edema.  GU:  No hematuria, no frequency. Gyn:  Never had vaginal discharge, irritation, dyspareunia.  GI:  Per HPI Heme:  No hx anemia   Transfusions:  none Neuro:  No headaches, no peripheral tingling or numbness Derm:  No itching, no rash or sores. 10 total tatoos beginning age 42, first was non-professional byt done with clean needles.  4 piercings which have been removed.  Endocrine:  No sweats or chills.  No polyuria or dysuria Immunization:  Not queried.  Travel:  None  beyond local counties in last few months.    PHYSICAL EXAM: Vital signs in last 24 hours: Filed Vitals:   03/24/14 1414  BP: 101/58  Pulse: 60  Temp: 98.6 F (37 C)  Resp: 18   Wt Readings from Last 3 Encounters:  03/24/14 52.164 kg (115 lb)  03/12/14 58.2 kg (128 lb 4.9 oz)  01/27/13 56.246 kg (124 lb)   General: pleasant, personable, well-appearing AAF who is comfortable Head:  No swelling or asymmetry.  Eyes:  No icterus or pallor.  EOMI Ears:  Not HOH  Nose:  No discharge Mouth:   No mucosal or dental disease.  Moist MM Neck:  No mass, no TMG. Lungs:  Clear bil.  No cough Heart: RRR.  No MRG Abdomen:  Soft, ND, active BS.  No mass.  Minor to mild tenderness in LLQ.  Scars at umbilicus from previous piercings Rectal: deferred.    Musc/Skeltl: no joint swelling or deformity Extremities:  No CCE.  Feet warm  Neurologic:  Oriented x 3.  No limb weakness.  Skin:  No rash.   Tattoos:  Several tattoos on arms, trunks Nodes:  No inguinal adenopathy.    Psych:  Pleasant, engaged, does not seem depressed.   Intake/Output from previous day: 04/21 0701 - 04/22 0700 In: 4852.1 [P.O.:1230; I.V.:3345.3; IV Piggyback:276.9] Out: 5550 [Urine:5550] Intake/Output this shift: Total I/O In: -  Out: 1600 [Urine:1600]  LAB RESULTS:  Recent Labs  03/22/14 0515 03/23/14 0525 03/24/14 0817  WBC 9.7 9.5 10.8*  HGB 12.5 13.0 13.3  HCT 35.2* 35.4* 36.1  PLT 431* 462* 465*   BMET Lab Results  Component Value Date   NA 140 03/23/2014   NA 142 03/22/2014   NA 141 03/21/2014   K 3.7 03/23/2014   K 3.5* 03/22/2014   K 3.4* 03/21/2014   CL 99 03/23/2014   CL 101 03/22/2014   CL 102 03/21/2014   CO2 30 03/23/2014   CO2 31 03/22/2014   CO2 27 03/21/2014   GLUCOSE 81 03/23/2014   GLUCOSE 80 03/22/2014   GLUCOSE 100* 03/21/2014   BUN 5* 03/23/2014   BUN 5* 03/22/2014   BUN 12 03/21/2014   CREATININE 0.80 03/23/2014   CREATININE 0.80 03/22/2014   CREATININE 0.72 03/21/2014   CALCIUM 8.8  03/23/2014   CALCIUM 8.6 03/22/2014   CALCIUM 9.5 03/21/2014   LFT  Recent Labs  03/22/14 0515 03/23/14 0525  PROT 6.3 6.4  ALBUMIN 3.0* 3.1*  AST 18 21  ALT 9 11  ALKPHOS 41 48  BILITOT 0.4 0.4   PT/INR No results found for this basename: INR,  PROTIME   Hepatitis Panel No results found for this basename: HEPBSAG, HCVAB, HEPAIGM, HEPBIGM,  in Epic.   Lipase     Component Value Date/Time   LIPASE 152* 03/24/2014 0817   Drugs of Abuse  No results found for this basename: labopia,  cocainscrnur,  labbenz,  amphetmu,  thcu,  labbarb     RADIOLOGY STUDIES: Ct Abdomen Pelvis W Contrast 03/24/2014   COMPARISON:  03/21/2014 and 03/12/2014.  FINDINGS: Lung bases show no acute findings. Heart size normal. No pericardial or pleural effusion.  Liver, gallbladder, adrenal glands and right kidney are unremarkable. A sub cm low-attenuation lesion in the interpolar left kidney (series 2, image 20) is too small to characterize but likely represents a cyst. A small infarct is not definitively excluded. Spleen, pancreas, stomach and small bowel are unremarkable.  An abscess in the pelvic cul-de-sac measures 1.4 x 4.8 cm (image 56), previously 1.4 x 6.2 cm. A second abscess between the uterus and rectum measures 2.5 x 5.4 cm (image 58), previously 4.1 x 6.3 cm. A heterogeneous collection to the right of the uterus may represent the right ovary and/or phlegmon, similar to the prior exam. Probable secondary inflammatory changes in the sigmoid colon. Colon is  otherwise unremarkable. No free fluid. No definite pathologically enlarged lymph nodes. No worrisome lytic or sclerotic lesions.  IMPRESSION: 1. Inflammatory changes in the pelvis, consistent with the given history of tubo-ovarian abscess. Abscess collections have decreased slightly in size in the interval. 2. Common bile duct appears normal in caliber when compared with the prior exam.   Electronically Signed   By: Leanna Battles M.D.   On: 03/24/2014  08:14    ENDOSCOPIC STUDIES: None ever.   IMPRESSION:   *  Elevated Lipase. Normal LFTs.  Pancreas and GB normal, 7 mm CBD on CT.  Ultrasound with 8 mm CBD, GB sludge, wall thickening and trace perihepatic ascites.  Drinks wine regularly but seemingly, if she is reliable historian, not to excess.  Lipase elevation can be seen in pelvic inflammatory/infectious process.   *  Terminal ileitis, seems to have resolved on latest CT.  Suspect this has been secondary to Gyn infectious/inflammatory process.   *  Tubo ovarian abscess, organisms not identified.  Wet prep + for Chlamydis, GC probe +.  Abdominal pain, appetite much improved.  Will continue several weeks of oral Cipro and Augmentin at discharge.  Location of abscess would appear to confirm gyn source.  *  ? Hep C positive.    PLAN:     *  Check Hepatitis serologies given  Mention of Hep C + in 2012 paper work.  Further input per Dr Marina Goodell (please see below).     Dianah Field  03/24/2014, 3:36 PM Pager: [phone]  GI ATTENDING  History, laboratories, x-rays reviewed. Examination by extender as outlined above. Case reviewed. Agree with H&P as outlined above.  IMPRESSION: 1. Mild elevation of serum lipase. Nonspecific. Can be seen with intra-abdominal inflammation. No evidence for pancreatitis. No further need for evaluation or monitoring. 2. Tubo-ovarian abscess. Minor inflammation of the intestinal tract on CT appears to be secondary to this process. Likelihood of inflammatory bowel disease seems low. Recommend treating abscess as outlined by ID. She does have ID followup. If there were to be recurrent problems or increased clinical suspicion of IBD, would recommend CT enterography. However, would not proceed in that direction at this time as the index of suspicion currently is low. 3. Questionable history of hepatitis C. Recommend checking serologies. If Positive then quantitative RNA should be checked. If this is positive,  she could followup with ID who is currently treating patients with hepatitis C.  Please call for questions. Will sign off. Thank you.  Wilhemina Bonito. Eda Keys., M.D. [facility] Division of Gastroenterology Pager: [phone]

## 2014-03-24 NOTE — Progress Notes (Signed)
CSW received returned call from V. Latham/CNM last pm to discuss CSW recommendation to start patient on an antidepressant.  CNM was in agreement and will start patient on Zoloft 50mg .

## 2014-03-24 NOTE — Progress Notes (Signed)
Patient ID: Heather KelpLachelle Rafter, female   DOB: February 19, 1991, 23 y.o.   MRN: [id number]         [facility]    Date of Admission:  03/21/2014   Total days of antibiotics 13        Day 4 piperacillin tazobactam         Principal Problem:   Pelvic abscess in female Active Problems:   Gonorrhea   Chlamydia   History of herpes genitalis   . docusate sodium  100 mg Oral BID  . ketorolac  30 mg Intravenous 4 times per day   Or  . ketorolac  30 mg Intramuscular 4 times per day  . piperacillin-tazobactam (ZOSYN)  IV  3.375 g Intravenous Q8H  . prenatal multivitamin  1 tablet Oral Q1200  . sertraline  50 mg Oral Daily    Subjective: She is feeling much better. Her pain is under with oral pain medication. It appears that she's only required one dose of pain medications at night. She denies having any current pain. She is eating well.   Objective: Temp:  [97.4 F (36.3 C)-98.5 F (36.9 C)] 97.4 F (36.3 C) (04/22 0957) Pulse Rate:  [56-88] 77 (04/22 0957) Resp:  [15-18] 16 (04/22 0957) BP: (95-117)/(53-72) 110/64 mmHg (04/22 0957) SpO2:  [100 %] 100 % (04/22 0957) Weight:  [115 lb (52.164 kg)] 115 lb (52.164 kg) (04/22 0509)  General: She appears comfortable and in no distress Lungs: Clear Cor: Regular S1 and S2 no murmur Abdomen: Soft without palpable masses. Minimal lower quadrant tenderness with palpation   Lab Results Lab Results  Component Value Date   WBC 10.8* 03/24/2014   HGB 13.3 03/24/2014   HCT 36.1 03/24/2014   MCV 82.2 03/24/2014   PLT 465* 03/24/2014    Lab Results  Component Value Date   CREATININE 0.80 03/23/2014   BUN 5* 03/23/2014   NA 140 03/23/2014   K 3.7 03/23/2014   CL 99 03/23/2014   CO2 30 03/23/2014    Lab Results  Component Value Date   ALT 11 03/23/2014   AST 21 03/23/2014   ALKPHOS 48 03/23/2014   BILITOT 0.4 03/23/2014      Microbiology: No results found for this or any previous visit (from the past 240  hour(s)).  Studies/Results: Ct Abdomen Pelvis W Contrast  03/24/2014   CLINICAL DATA:  Tubo-ovarian abscess, on antibiotic therapy.  EXAM: CT ABDOMEN AND PELVIS WITH CONTRAST  TECHNIQUE: Multidetector CT imaging of the abdomen and pelvis was performed using the standard protocol following bolus administration of intravenous contrast.  CONTRAST:  1 OMNIPAQUE IOHEXOL 300 MG/ML SOLN, 100mL OMNIPAQUE IOHEXOL 300 MG/ML SOLN  COMPARISON:  03/21/2014 and 03/12/2014.  FINDINGS: Lung bases show no acute findings. Heart size normal. No pericardial or pleural effusion.  Liver, gallbladder, adrenal glands and right kidney are unremarkable. A sub cm low-attenuation lesion in the interpolar left kidney (series 2, image 20) is too small to characterize but likely represents a cyst. A small infarct is not definitively excluded. Spleen, pancreas, stomach and small bowel are unremarkable.  An abscess in the pelvic cul-de-sac measures 1.4 x 4.8 cm (image 56), previously 1.4 x 6.2 cm. A second abscess between the uterus and rectum measures 2.5 x 5.4 cm (image 58), previously 4.1 x 6.3 cm. A heterogeneous collection to the right of the uterus may represent the right ovary and/or phlegmon, similar to the prior exam. Probable secondary inflammatory changes in the sigmoid colon.  Colon is otherwise unremarkable. No free fluid. No definite pathologically enlarged lymph nodes. No worrisome lytic or sclerotic lesions.  IMPRESSION: 1. Inflammatory changes in the pelvis, consistent with the given history of tubo-ovarian abscess. Abscess collections have decreased slightly in size in the interval. 2. Common bile duct appears normal in caliber when compared with the prior exam.   Electronically Signed   By: Leanna BattlesMelinda  Blietz M.D.   On: 03/24/2014 08:14    Assessment: She is improving on broad empiric antibiotic therapy for pelvic abscess. Her CT scan shows slight decrease in abscess sizes. I would switch her to oral ciprofloxacin 750 mg  twice daily and Augmentin 875 mg twice daily and discharge her home tomorrow. I will arrange followup in my clinic. I will plan on approximately 4 weeks of antibiotic therapy. She may need a followup scan near the end of her antibiotic course to help define optimal duration of treatment.   Plan: 1. Probable discharge home tomorrow on oral ciprofloxacin and Augmentin  Cliffton AstersJohn Deckard Stuber, MD [facility] [phone] pager   [phone] cell 03/24/2014, 12:38 PM

## 2014-03-24 NOTE — Progress Notes (Signed)
Heather Lane is [age] y.o.  [id number]  Hospital Day #3  Pelvic Abscess  Subjective: Patient is continuing to improve.  States that she no longer has pain with ambulating and eating. Patient has good pain control with oral NSAID & narcotic.,  In the process of drinking oral contrast for CT scan this a.m.  Per ID Consultant-Dr. Orvan Falconerampbell, patient should be given Cipro 750 mg with Augmentin 875 mg twice daily for "several weeks" once she transitions  to oral antibiotics.  Objective: Vital signs in last 24 hours: Temp:  [97.7 F (36.5 C)-98.5 F (36.9 C)] 97.7 F (36.5 C) (04/22 0509) Pulse Rate:  [56-88] 63 (04/22 0509) Resp:  [12-18] 15 (04/22 0509) BP: (95-133)/(53-72) 100/54 mmHg (04/22 0509) SpO2:  [95 %-100 %] 100 % (04/22 0509) Weight:  [115 lb (52.164 kg)] 115 lb (52.164 kg) (04/22 0509)  Intake/Output from previous day: 04/21 0701 - 04/22 0700 In: 4852.1 [P.O.:1230; I.V.:3345.3] Out: 5550 [Urine:5550] Intake/Output this shift: Total I/O In: 2912.1 [P.O.:990; I.V.:1695.3; IV Piggyback:226.9] Out: 3900 [Urine:3900]  Recent Labs Lab 03/21/14 0336 03/22/14 0515 03/23/14 0525  WBC 12.9* 9.7 9.5  HGB 13.8 12.5 13.0  HCT 37.2 35.2* 35.4*  PLT 487* 431* 462*     Recent Labs Lab 03/21/14 0336 03/22/14 0515 03/23/14 0525  NA 141 142 140  K 3.4* 3.5* 3.7  CL 102 101 99  CO2 27 31 30   BUN 12 5* 5*  CREATININE 0.72 0.80 0.80  CALCIUM 9.5 8.6 8.8  PROT 7.5 6.3 6.4  BILITOT 0.3 0.4 0.4  ALKPHOS 55 41 48  ALT 12 9 11   AST 18 18 21   GLUCOSE 100* 80 81    EXAM: General: alert, cooperative and no distress Resp: clear to auscultation bilaterally Cardio: regular rate and rhythm, S1, S2 normal, no murmur, click, rub or gallop GI: Bowel sounds present, soft and tender in both lower quadrants with deep palpation with voluntary guarding. but no rebound. Extremities: No calf tenderness   Assessment: Pelvic Pain/Pelvic Abscess: stable and progressing  well  Plan: Repeat CT scan of pelvis with contrast today. Routine care  LOS: 3 days    Henreitta Leberlmira Powell, PA-C 03/24/2014 6:53 AM  CT scan with some improvement.  Pt clinically improved.  No further pain with eating or movement.  Plan to d/c home tomorrow.  Pt switched to cipro and augmentin per ID recs and will f/u with them outpt.  Pt instructed to f/u with Dr. Estanislado Pandyivard in 1-2wks.  Plan to d/c home on cipro and augmentin per ID for several weeks.  Consider repeat CT scan in a couple of months.  Will repeat lipase in am but GI seems to think it is all reactionary to her primary process.  Rec f/u lipase after course of abx.  Hep C testing pending per some questionable history.

## 2014-03-24 NOTE — Progress Notes (Signed)
Received TC last evening from SW, DeLandolleen Shaw--she evaluated the patient yesterday and discussed the option of medication for depression.  Patient did desire to start med and requested Ms. Clelia CroftShaw contact me to discuss.  I agree with the recommendation, based on my discussion with the patient on admission. Ms. Clelia CroftShaw has also [facility] and [facility]. Will discuss further with the patient on evening rounds. Rx Zoloft 50 mg po q day.  Nigel BridgemanVicki Ledger Heindl, CNM 4/322/15 0730

## 2014-03-25 LAB — HEPATITIS PANEL, ACUTE
HCV AB: NEGATIVE
Hep A IgM: NONREACTIVE
Hep B C IgM: NONREACTIVE
Hepatitis B Surface Ag: NEGATIVE

## 2014-03-25 LAB — LIPASE, BLOOD: LIPASE: 128 U/L — AB (ref 11–59)

## 2014-03-25 MED ORDER — SERTRALINE HCL 50 MG PO TABS: 50.0000 mg | ORAL_TABLET | Freq: Every day | ORAL | Status: DC

## 2014-03-25 MED ORDER — MEDROXYPROGESTERONE ACETATE 150 MG/ML IM SUSP: 150.0000 mg | INTRAMUSCULAR | Status: DC

## 2014-03-25 MED ORDER — CIPROFLOXACIN HCL 750 MG PO TABS: 750.0000 mg | ORAL_TABLET | Freq: Two times a day (BID) | ORAL | Status: DC

## 2014-03-25 MED ORDER — ONDANSETRON HCL 4 MG PO TABS: 4.0000 mg | ORAL_TABLET | Freq: Four times a day (QID) | ORAL | Status: DC | PRN

## 2014-03-25 MED ORDER — IBUPROFEN 600 MG PO TABS: 600.0000 mg | ORAL_TABLET | Freq: Four times a day (QID) | ORAL | Status: DC | PRN

## 2014-03-25 MED ORDER — AMOXICILLIN-POT CLAVULANATE 875-125 MG PO TABS: 1.0000 | ORAL_TABLET | Freq: Two times a day (BID) | ORAL | Status: DC

## 2014-03-25 MED ORDER — MEDROXYPROGESTERONE ACETATE 150 MG/ML IM SUSP
150.0000 mg | Freq: Once | INTRAMUSCULAR | Status: AC
  Administered 2014-03-25: 150 mg via INTRAMUSCULAR
  Filled 2014-03-25: qty 1

## 2014-03-25 MED ORDER — HYDROCODONE-ACETAMINOPHEN 5-325 MG PO TABS: 1.0000 | ORAL_TABLET | ORAL | Status: DC | PRN

## 2014-03-25 NOTE — Progress Notes (Signed)
Pt discharged home with mother... Condition stable... No equipment... Ambulated to car with L. Ilsa IhaSnyder, NT.

## 2014-03-25 NOTE — Discharge Summary (Signed)
Physician Discharge Summary  Patient ID: Heather KelpLachelle Daft MRN: [id number] DOB/AGE: 1991/09/20 22 y.o.  Admit date: 03/21/2014 Discharge date: 03/25/2014   Discharge Diagnoses:  Principal Problem:   Pelvic abscess in female Active Problems:   Gonorrhea   Chlamydia   History of herpes genitalis    Discharged Condition: Good Hospital Course: On the date of admission the [facility] after having returned their from a 03/11/2014 visit where she was treated for PID.  She was complaining of relentless pelvic pain that was attributed to a pelvic abscess indicated on CT scan.  Her CBC was normal as was her CMET but her lipase was elevated at 152 (maximum value).   She was placed on IV analgesia and broad spectrum antibiotics. Over the course of two days her pelvic CT showed some decrease in pelvic collection and clinically the patient was responding positively.  She was found to have a positive gonorrhea and chlamydia.  Due to her  elevated lipase a GI consult was obtained and was subsequently attributed to the patient's  pelvic inflammatory process. The patient also received an Infectious Disease consultation that concurred with her chosen antibiotic therapy and course of treatment.  It was further advised that she remain on antibiotic therapy for 3 weeks after hospital discharge.  By hospital day #4 the patient had markedly improved and was believed to have received the maximum benefit of her hospital stay therefore was discharged home.  Disposition: 01-Home or Self Care  Discharge Medications:    Medication List    STOP taking these medications       doxycycline 100 MG tablet  Commonly known as:  VIBRA-TABS      TAKE these medications       amoxicillin-clavulanate 875-125 MG per tablet  Commonly known as:  AUGMENTIN  Take 1 tablet by mouth every 12 (twelve) hours.     ciprofloxacin 750 MG tablet  Commonly known as:  CIPRO  Take 1 tablet (750 mg total) by  mouth every 12 (twelve) hours.     HYDROcodone-acetaminophen 5-325 MG per tablet  Commonly known as:  NORCO/VICODIN  Take 1-2 tablets by mouth every 4 (four) hours as needed for moderate pain.     ibuprofen 600 MG tablet  Commonly known as:  ADVIL,MOTRIN  Take 1 tablet (600 mg total) by mouth every 6 (six) hours as needed for mild pain or cramping.     ondansetron 4 MG tablet  Commonly known as:  ZOFRAN  Take 1 tablet (4 mg total) by mouth every 6 (six) hours as needed for nausea.     sertraline 50 MG tablet  Commonly known as:  ZOLOFT  Take 1 tablet (50 mg total) by mouth daily.     tetrahydrozoline-zinc 0.05-0.25 % ophthalmic solution  Commonly known as:  VISINE-AC  Place 2 drops into both eyes 3 (three) times daily as needed (for itchy eyes).          Follow-up: Dr. Dois DavenportSandra Rivard in 1 week (Gynecologist)                    Dr. Wilhemina BonitoJohn N. Marina GoodellPerry in 2 weeks  Medical illustrator(Gastroenteroligist)            Signed: Henreitta LeberElmira Priyansh Pry, PA-C 03/25/2014, 7:41 AM

## 2014-03-25 NOTE — Progress Notes (Signed)
Heather Lane is [age] y.o.  [id number]  Hospital Day # 4:  Pelvic Pain/Pelvic Abscess  Subjective: Patient is continuing to improve, tolerating regular diet, voiding, pain almost nil,  passing flatus and states that she feels much better. Patient has no complaints.   Objective: Vital signs in last 24 hours: Temp:  [97.4 F (36.3 C)-98.6 F (37 C)] 98 F (36.7 C) (04/23 0521) Pulse Rate:  [60-91] 73 (04/23 0521) Resp:  [16-18] 18 (04/23 0521) BP: (98-134)/(52-83) 98/52 mmHg (04/23 0521) SpO2:  [99 %-100 %] 99 % (04/23 0521) Weight:  [113 lb 7 oz (51.455 kg)] 113 lb 7 oz (51.455 kg) (04/23 0521)  Intake/Output from previous day: 04/22 0701 - 04/23 0700 In: 5725 [P.O.:1490; I.V.:4136.6] Out: 3000 [Urine:3000] Intake/Output this shift:    Recent Labs Lab 03/22/14 0515 03/23/14 0525 03/24/14 0817  WBC 9.7 9.5 10.8*  HGB 12.5 13.0 13.3  HCT 35.2* 35.4* 36.1  PLT 431* 462* 465*     Recent Labs Lab 03/21/14 0336 03/22/14 0515 03/23/14 0525  NA 141 142 140  K 3.4* 3.5* 3.7  CL 102 101 99  CO2 27 31 30   BUN 12 5* 5*  CREATININE 0.72 0.80 0.80  CALCIUM 9.5 8.6 8.8  PROT 7.5 6.3 6.4  BILITOT 0.3 0.4 0.4  ALKPHOS 55 41 48  ALT 12 9 11   AST 18 18 21   GLUCOSE 100* 80 81    EXAM: General: alert, cooperative and no distress Resp: clear to auscultation bilaterally Cardio: regular rate and rhythm, S1, S2 normal, no murmur, click, rub or gallop GI: Soft, bowel sounds present,  mild tenderness to deep palpation but no guarding. Extremities: No calf tenderness.   Assessment: Pelvic Abscess- stable and progressing well  Plan: Probable discharge home today.  LOS: 4 days    Henreitta Leberlmira Powell, PA-C 03/25/2014 7:27 AM  PROGRESS NOTE  I have reviewed the patient's vital signs, labs, and notes. I have examined the patient. I agree with the previous note from the physician assistant.  Depo Provera for contraception. Percocet for pain.  Leonard SchwartzArthur Vernon Briani Maul,  M.D. 03/25/2014

## 2014-03-25 NOTE — Progress Notes (Signed)
Pt has been discharged home. This note is to document that hepatitis panel reviewed and she does not have Hep C.  I called Heather Lane just now to let her know the good news. Incidentally the contact home phone listed is her Aunt's #, Salvador's cell phone  Is [phone].    Ref. Range 03/24/2014 15:31  Hep A IgM Latest Range: NON REACTIVE  NON REACTIVE  Hepatitis B Surface Ag Latest Range: NEGATIVE  NEGATIVE  Hep B C IgM Latest Range: NON REACTIVE  NON REACTIVE  HCV Ab Latest Range: NEGATIVE  NEGATIVE   Heather FieldSarah J Gribbin, PA-C

## 2014-03-25 NOTE — Discharge Instructions (Signed)
Call [facility], [phone] for:  To schedule, in 4 weeks a  follow up  appointment time and date Temperature greater than or equal to 100.4 degrees Farenheit orally Excessive pain not managed with your pain medications Or other concerns   While taking pain medications, take Colace (Docusate Sodium) 100 mg 2-3 times daily until bowel movements are regular to prevent constipation.  Call to schedule an appointment with Dr. Yancey FlemingsJohn Perry,  [phone] the gastroenterolgy specialist (GI) in 2 weeks  Call to schedule an appointment with Dr. Estanislado Pandyivard,  [phone] the gynecologist in 1 week  You may shower   You should not resume sexual activity until after you have had your follow up with Dr. Werner Leanivard  You may resume a regular diet

## 2014-03-29 ENCOUNTER — Inpatient Hospital Stay (HOSPITAL_COMMUNITY)
Admission: AD | Admit: 2014-03-29 | Discharge: 2014-03-29 | Disposition: A | Discharge status: Home / Self Care | Payer: Medicaid Other | Source: Ambulatory Visit | Attending: Obstetrics and Gynecology | Admitting: Obstetrics and Gynecology

## 2014-03-29 ENCOUNTER — Encounter (HOSPITAL_COMMUNITY): Payer: Self-pay | Admitting: *Deleted

## 2014-03-29 DIAGNOSIS — F329 Major depressive disorder, single episode, unspecified: Secondary | ICD-10-CM | POA: Insufficient documentation

## 2014-03-29 DIAGNOSIS — Z87891 Personal history of nicotine dependence: Secondary | ICD-10-CM | POA: Insufficient documentation

## 2014-03-29 DIAGNOSIS — F3289 Other specified depressive episodes: Secondary | ICD-10-CM | POA: Insufficient documentation

## 2014-03-29 DIAGNOSIS — Z833 Family history of diabetes mellitus: Secondary | ICD-10-CM | POA: Insufficient documentation

## 2014-03-29 DIAGNOSIS — B372 Candidiasis of skin and nail: Secondary | ICD-10-CM | POA: Insufficient documentation

## 2014-03-29 HISTORY — DX: Female pelvic inflammatory disease, unspecified: N73.9

## 2014-03-29 MED ORDER — NYSTATIN-TRIAMCINOLONE 100000-0.1 UNIT/GM-% EX OINT: 1.0000 "application " | TOPICAL_OINTMENT | Freq: Two times a day (BID) | CUTANEOUS | Status: DC

## 2014-03-29 NOTE — Discharge Instructions (Signed)
Yeast Infection of the Skin Some yeast on the skin is normal, but sometimes it causes an infection. If you have a yeast infection, it shows up as white or light brown patches on brown skin. You can see it better in the summer on tan skin. It causes light-colored holes in your suntan. It can happen on any area of the body. This cannot be passed from person to person. HOME CARE  Scrub your skin daily with a dandruff shampoo. Your rash may take a couple weeks to get well.  Do not scratch or itch the rash. GET HELP RIGHT AWAY IF:   You get another infection from scratching. The skin may get warm, red, and may ooze fluid.  The infection does not seem to be getting better. MAKE SURE YOU:  Understand these instructions.  Will watch your condition.  Will get help right away if you are not doing well or get worse. Document Released: 11/01/2008 Document Revised: 02/11/2012 Document Reviewed: 11/01/2008 [facility] Patient Information 2014 [employer] [place]. Nystatin; Triamcinolone cream or ointment What is this medicine? NYSTATIN; TRIAMCINOLONE (nye STAT in; trye am SIN oh lone) is a combination of an antifungal medicine and a steroid. It is used to treat certain kinds of fungal or yeast infections of the skin. This medicine may be used for other purposes; ask your health care provider or pharmacist if you have questions. COMMON BRAND NAME(S): Myco-Triacet-II, Mycogen-II, Mycolog II, Mytrex, N.T.A. What should I tell my health care provider before I take this medicine? They need to know if you have any of these conditions: -large areas of burned or damaged skin -skin wasting or thinning -peripheral vascular disease or poor circulation -an unusual or allergic reaction to nystatin, triamcinolone, other corticosteroids, other medicines, foods, dyes, or preservatives -pregnant or trying to get pregnant -breast-feeding How should I use this medicine? This medicine is for external use only. Do not take  by mouth. Follow the directions on the prescription label. Wash your hands before and after use. If treating hand or nail infections, wash hands before use only. Apply a thin layer of this medicine to the affected area and rub in gently. Do not use on healthy skin or over large areas of skin. Do not get this medicine in your eyes. If you do, rinse out with plenty of cool tap water. When applying to the groin area, apply a limited amount and do not use for longer than 2 weeks unless directed to by your doctor or health care professional. Do not cover or wrap the treated area with an airtight bandage (such as a plastic bandage). Use the full course of treatment prescribed, even if you think the infection is getting better. Use at regular intervals. Do not use your medicine more often than directed. Do not use this medicine for any condition other than the one for which it was prescribed. Talk to your pediatrician regarding the use of this medicine in children. While this drug may be prescribed for selected conditions, precautions do apply. Children being treated in the diaper area should not wear tight-fitting diapers or plastic pants. Elderly patients are more likely to have damaged skin through aging, and this may increase side effects. This medicine should only be used for brief periods and infrequently in older patients. Overdosage: If you think you have taken too much of this medicine contact a poison control center or emergency room at once. NOTE: This medicine is only for you. Do not share this medicine with others. What  if I miss a dose? If you miss a dose, use it as soon as you can. If it is almost time for your next dose, use only that dose. Do not use double or extra doses. What may interact with this medicine? Interactions are not expected. Do not use any other skin products on the affected area without telling your doctor or health care professional. This list may not describe all possible  interactions. Give your health care provider a list of all the medicines, herbs, non-prescription drugs, or dietary supplements you use. Also tell them if you smoke, drink alcohol, or use illegal drugs. Some items may interact with your medicine. What should I watch for while using this medicine? Tell your doctor or health care professional if your symptoms do not start to get better within 1 week when treating the groin area or within 2 weeks when treating the feet. . Tell your doctor or health care professional if you develop sores or blisters that do not heal properly. If your skin infection returns after stopping this medicine, contact your doctor or health care professional. If you are using this medicine to treat an infection in the groin area, do not wear underwear that is tight-fitting or made from synthetic fibers such as rayon or nylon. Instead, wear loose-fitting, cotton underwear. Also dry the area completely after bathing. What side effects may I notice from receiving this medicine? Side effects that you should report to your doctor or health care professional as soon as possible: -burning or itching of the skin -dark red spots on the skin -loss of feeling on skin -painful, red, pus-filled blisters in hair follicles -skin infection -thinning of the skin or sunburn: more likely if applied to the face Side effects that usually do not require medical attention (report to your doctor or health care professional if they continue or are bothersome): -dry or peeling skin -skin irritation This list may not describe all possible side effects. Call your doctor for medical advice about side effects. You may report side effects to FDA at 1-800-FDA-1088. Where should I keep my medicine? Keep out of the reach of children. Store at room temperature between 15 and 30 degrees C (59 and 86 degrees F). Do not freeze. Throw away any unused medicine after the expiration date. NOTE: This sheet is a  summary. It may not cover all possible information. If you have questions about this medicine, talk to your doctor, pharmacist, or health care provider.  2014, Elsevier/Gold Standard. (2008-06-11 17:29:26)

## 2014-03-29 NOTE — MAU Note (Signed)
Patient states her skin is peeling on her inner thighs and groin area.States it started yesterday. Is taking Cirpo and read skin peeling is a side effect and to call your doctor. Denies pain and itching.

## 2014-03-29 NOTE — MAU Provider Note (Signed)
History   Patient is a 23 y.o.who presents unannounced complaining of skin peeling, in her pelvic and bikini area, after shaving. Patient was recently discharged, on medication, for a pelvic abscess related to GC/CT infection.  She is currently taking cipro and augmentin, although she states hat she was unable to take her medication until Friday due to insurance.  She denies itching, burning, pain, or smell in the area.  Patient states that she used a new razor and that peeling does not cause any distress. Patient denies recent or current fever, n/v, and denies sexual intercourse.   Patient also reporting some discomforting after bowel movements.  Denies constipation or diarrhea.   Patient Active Problem List   Diagnosis Date Noted  . Pelvic abscess in female 03/21/2014  . Chlamydia 03/21/2014  . History of herpes genitalis 03/21/2014  . Gonorrhea 03/14/2014  . Previous cesarean delivery 01/27/2013  . Cigarette smoker 01/27/2013    No chief complaint on file.  HPI  OB History   Grav Para Term Preterm Abortions TAB SAB Ect Mult Living   1 1        1       Past Medical History  Diagnosis Date  . Hepatitis C antibody test positive     this is written in admission paperwork in 2012.  needs to be confirmed with blood tests.   . Chlamydia trachomatis infection of genitourinary sites 2009  . HSV-1 (herpes simplex virus 1) infection   . Depression   . PID (pelvic inflammatory disease)     Past Surgical History  Procedure Laterality Date  . Cesarean section  05/2011    Family History  Problem Relation Age of Onset  . Diabetes Sister   . Diabetes Maternal Aunt   . Hypertension Maternal Aunt   . Diabetes Maternal Uncle   . Hypertension Maternal Uncle     History  Substance Use Topics  . Smoking status: Former Smoker -- 1.00 packs/day    Types: Cigarettes    Quit date: 03/15/2014  . Smokeless tobacco: Never Used  . Alcohol Use: 1.8 oz/week    3 Glasses of wine per week     Allergies: No Known Allergies  Prescriptions prior to admission  Medication Sig Dispense Refill  . amoxicillin-clavulanate (AUGMENTIN) 875-125 MG per tablet Take 1 tablet by mouth every 12 (twelve) hours.  42 tablet  0  . ciprofloxacin (CIPRO) 750 MG tablet Take 1 tablet (750 mg total) by mouth every 12 (twelve) hours.  21 tablet  0  . ibuprofen (ADVIL,MOTRIN) 600 MG tablet Take 1 tablet (600 mg total) by mouth every 6 (six) hours as needed for mild pain or cramping.  30 tablet  0  . tetrahydrozoline-zinc (VISINE-AC) 0.05-0.25 % ophthalmic solution Place 2 drops into both eyes 3 (three) times daily as needed (for itchy eyes).      . medroxyPROGESTERone (DEPO-PROVERA) 150 MG/ML injection Inject 1 mL (150 mg total) into the muscle every 3 (three) months.  1 mL  3    Review of Systems  Constitutional: Negative for fever and chills.  Gastrointestinal: Negative for nausea, vomiting, abdominal pain, diarrhea and constipation.  Genitourinary: Negative for dysuria, urgency, frequency and hematuria.  Musculoskeletal: Negative for joint pain and myalgias.  Skin: Positive for rash. Negative for itching.       Skin peeling   Physical Exam   Blood pressure 111/69, pulse 84, temperature 98.5 F (36.9 C), resp. rate 16, height 5\' 5"  (1.651 m), weight 111 lb (50.349  kg), last menstrual period 03/10/2014.  Physical Exam  Constitutional: She is oriented to person, place, and time. She appears well-nourished.  Cardiovascular: Normal rate.   GI: Bowel sounds are normal.  Genitourinary: Rectum normal. Rectal exam shows no external hemorrhoid and anal tone normal.     Skin actively peeling, no erythema, warm, dry. Labia and rectum without rash/peeling.  Neurological: She is alert and oriented to person, place, and time.  Skin: Skin is warm and dry. No erythema. No pallor.    ED Course  Assessment 23 y.o. Female Candidias of Skin  Plan: PE as charted Mycolog RX given Continue to take your  antibiotics as prescriped Keep appt as scheduled Call if you have any questions or concerns prior to your next visit.   Gerrit HeckJessica Rahshawn Remo CNM, MSN 03/29/2014 8:46 PM

## 2014-04-01 ENCOUNTER — Encounter: Payer: Self-pay | Admitting: Internal Medicine

## 2014-04-01 ENCOUNTER — Ambulatory Visit (INDEPENDENT_AMBULATORY_CARE_PROVIDER_SITE_OTHER): Payer: Medicaid Other | Admitting: Internal Medicine

## 2014-04-01 ENCOUNTER — Other Ambulatory Visit: Payer: Self-pay | Admitting: *Deleted

## 2014-04-01 VITALS — BP 111/71 | HR 71 | Temp 98.2°F | Wt 114.8 lb

## 2014-04-01 DIAGNOSIS — N730 Acute parametritis and pelvic cellulitis: Secondary | ICD-10-CM

## 2014-04-01 MED ORDER — AMOXICILLIN-POT CLAVULANATE 875-125 MG PO TABS: 1.0000 | ORAL_TABLET | Freq: Two times a day (BID) | ORAL | Status: DC

## 2014-04-01 NOTE — Progress Notes (Signed)
Patient ID: Heather Lane, female   DOB: 22-Oct-1991, 22 y.o.   MRN: [id number]         [facility]  Patient Active Problem List   Diagnosis Date Noted  . Pelvic abscess in female 03/21/2014  . Chlamydia 03/21/2014  . History of herpes genitalis 03/21/2014  . Gonorrhea 03/14/2014  . Previous cesarean delivery 01/27/2013  . Cigarette smoker 01/27/2013    Patient's Medications  New Prescriptions   No medications on file  Previous Medications   IBUPROFEN (ADVIL,MOTRIN) 600 MG TABLET    Take 1 tablet (600 mg total) by mouth every 6 (six) hours as needed for mild pain or cramping.   MEDROXYPROGESTERONE (DEPO-PROVERA) 150 MG/ML INJECTION    Inject 1 mL (150 mg total) into the muscle every 3 (three) months.   NYSTATIN-TRIAMCINOLONE OINTMENT (MYCOLOG)    Apply 1 application topically 2 (two) times daily.   TETRAHYDROZOLINE-ZINC (VISINE-AC) 0.05-0.25 % OPHTHALMIC SOLUTION    Place 2 drops into both eyes 3 (three) times daily as needed (for itchy eyes).  Modified Medications   Modified Medication Previous Medication   AMOXICILLIN-CLAVULANATE (AUGMENTIN) 875-125 MG PER TABLET amoxicillin-clavulanate (AUGMENTIN) 875-125 MG per tablet      Take 1 tablet by mouth every 12 (twelve) hours.    Take 1 tablet by mouth every 12 (twelve) hours.  Discontinued Medications   AMOXICILLIN-CLAVULANATE (AUGMENTIN) 875-125 MG PER TABLET    Take 1 tablet by mouth every 12 (twelve) hours.   CIPROFLOXACIN (CIPRO) 750 MG TABLET    Take 1 tablet (750 mg total) by mouth every 12 (twelve) hours.    Subjective: Heather Lane comes in for her hospital followup visit. She was recently diagnosed with PID, GC and Chlamydia. She was initially treated as an outpatient but then admitted to the hospital with severe abdominal pain. She was found to have a large bilobed pelvic abscess. She was started on IV piperacillin tazobactam and improved. Followup CT scan showed some decrease in size of the  abscess. She was discharged on oral ciprofloxacin and amoxicillin-clavulanate on April 23. She is now completed 11 days for her abscess and a total of 21 days of antibiotics. At the time of discharge she had noticed some extreme dryness in her groin with some mild itching. After discharge she began to have some desquamation. She was seen back at the hospital on April 27. She became concerned that it might be a reaction to ciprofloxacin so stopped taking it that day. She continues to take amoxicillin-clavulanate without difficulty. The dryness and desquamation in the groin is improving. Her pain has decreased significantly. She now only has mild pain when trying to sit up. She describes the pain as intermittent and crampy in like when she has her period. Her appetite is improving.  Review of Systems: Pertinent items are noted in HPI.  Past Medical History  Diagnosis Date  . Hepatitis C antibody test positive     this is written in admission paperwork in 2012.  needs to be confirmed with blood tests.   . Chlamydia trachomatis infection of genitourinary sites 2009  . HSV-1 (herpes simplex virus 1) infection   . Depression   . PID (pelvic inflammatory disease)     History  Substance Use Topics  . Smoking status: Former Smoker -- 1.00 packs/day    Types: Cigarettes    Quit date: 03/15/2014  . Smokeless tobacco: Never Used  . Alcohol Use: 1.8 oz/week    3 Glasses of wine per  week    Family History  Problem Relation Age of Onset  . Diabetes Sister   . Diabetes Maternal Aunt   . Hypertension Maternal Aunt   . Diabetes Maternal Uncle   . Hypertension Maternal Uncle     No Known Allergies  Objective: Temp: 98.2 F (36.8 C) (04/30 0905) Temp src: Oral (04/30 0905) BP: 111/71 mmHg (04/30 0905) Pulse Rate: 71 (04/30 0905)  General: She is smiling and in good spirits Skin: She is a very superficial desquamation in her groin and perineum in a distribution compatible with candidiasis. She  has no active lesions currently Lungs: Clear Cor: Regular S1 and S2 no murmurs Abdomen: Soft and nontender   Assessment: She is improving on empiric antibiotic therapy for pelvic abscess. Her risk for multidrug resistant gram-negative rods such as Pseudomonas was actually quite low so well we'll continue her off of ciprofloxacin. I will treat her with amoxicillin clavulanate alone. I suspect that she actually had candidiasis and not an allergic reaction.  Plan: 1. Continue amoxicillin clavulanate 2. Followup pelvic scan on the May 12 3. Followup here May 14   Cliffton AstersJohn Nickalus Thornsberry, MD [facility] [phone] pager   [phone] cell 04/01/2014, 9:48 AM

## 2014-04-15 ENCOUNTER — Encounter: Payer: Self-pay | Admitting: Internal Medicine

## 2014-04-15 ENCOUNTER — Inpatient Hospital Stay: Payer: Medicaid Other | Admitting: Internal Medicine

## 2014-04-15 ENCOUNTER — Ambulatory Visit (INDEPENDENT_AMBULATORY_CARE_PROVIDER_SITE_OTHER): Payer: Medicaid Other | Admitting: Internal Medicine

## 2014-04-15 VITALS — BP 106/69 | HR 84 | Temp 98.3°F | Wt 116.2 lb

## 2014-04-15 DIAGNOSIS — N731 Chronic parametritis and pelvic cellulitis: Secondary | ICD-10-CM

## 2014-04-15 DIAGNOSIS — N739 Female pelvic inflammatory disease, unspecified: Secondary | ICD-10-CM

## 2014-04-15 NOTE — Progress Notes (Signed)
Patient ID: Heather Lane, female   DOB: 05-25-1991, 23 y.o.   MRN: [id number]         [facility]  Patient Active Problem List   Diagnosis Date Noted  . Pelvic abscess in female 03/21/2014  . Chlamydia 03/21/2014  . History of herpes genitalis 03/21/2014  . Gonorrhea 03/14/2014  . Previous cesarean delivery 01/27/2013  . Cigarette smoker 01/27/2013    Patient's Medications  New Prescriptions   No medications on file  Previous Medications   AMOXICILLIN-CLAVULANATE (AUGMENTIN) 875-125 MG PER TABLET    Take 1 tablet by mouth every 12 (twelve) hours.   IBUPROFEN (ADVIL,MOTRIN) 600 MG TABLET    Take 1 tablet (600 mg total) by mouth every 6 (six) hours as needed for mild pain or cramping.   MEDROXYPROGESTERONE (DEPO-PROVERA) 150 MG/ML INJECTION    Inject 1 mL (150 mg total) into the muscle every 3 (three) months.   NYSTATIN-TRIAMCINOLONE OINTMENT (MYCOLOG)    Apply 1 application topically 2 (two) times daily.   TETRAHYDROZOLINE-ZINC (VISINE-AC) 0.05-0.25 % OPHTHALMIC SOLUTION    Place 2 drops into both eyes 3 (three) times daily as needed (for itchy eyes).  Modified Medications   No medications on file  Discontinued Medications   No medications on file    Subjective: Heather Lane is in for her routine followup visit. She continues to take Augmentin without difficulty. She has now completed 34 days of total antibiotic therapy with the last 24 days of treatment being for her pelvic abscesses. She is not having any fever, chills or sweats and her appetite is improving. She recently started back on her menstrual cycle and is having more low pelvic pain. She states that the amount of pain she is having is unlike anything she has had with previous periods. Her pain has been up to 6/10 and relieved by ibuprofen.  The peeling rash in her groin has resolved but she has started having some peeling on her palms recently. She's had no itching. Her followup CT scan has not  been arranged yet.  Review of Systems: Pertinent items are noted in HPI.  Past Medical History  Diagnosis Date  . Hepatitis C antibody test positive     this is written in admission paperwork in 2012.  needs to be confirmed with blood tests.   . Chlamydia trachomatis infection of genitourinary sites 2009  . HSV-1 (herpes simplex virus 1) infection   . Depression   . PID (pelvic inflammatory disease)     History  Substance Use Topics  . Smoking status: Current Some Day Smoker -- 1.00 packs/day    Types: Cigarettes    Last Attempt to Quit: 03/15/2014  . Smokeless tobacco: Never Used  . Alcohol Use: 1.8 oz/week    3 Glasses of wine per week    Family History  Problem Relation Age of Onset  . Diabetes Sister   . Diabetes Maternal Aunt   . Hypertension Maternal Aunt   . Diabetes Maternal Uncle   . Hypertension Maternal Uncle     No Known Allergies  Objective: Temp: 98.3 F (36.8 C) (05/14 1100) Temp src: Oral (05/14 1100) BP: 106/69 mmHg (05/14 1100) Pulse Rate: 84 (05/14 1100)  General: She is in no distress Skin: Mild superficial desquamation of her palms with some extending onto the dorsum of her hands and wrists Lungs: Clear Cor: Regular S1 and S2 with no murmur Abdomen: Soft without palpable masses. She has some tenderness with palpation in both lower  quadrants, left greater than right   Assessment: Overall she is better but I am concerned about her recurrent pelvic pain. She needs a followup pelvic CT scan as soon as possible. I do not know the cause of her desquamation. It would be unusual for this to be related to Augmentin given its focal nature. I will continue Augmentin for now and see that a CT scan is arranged.  Plan: 1. Continue Augmentin for now 2. Coordinate followup CT scan 3. Followup here May 20   Cliffton AstersJohn Shaul Trautman, MD [facility] [phone] pager   [phone] cell 04/15/2014, 11:20 AM

## 2014-04-20 ENCOUNTER — Other Ambulatory Visit: Payer: Self-pay | Admitting: Obstetrics and Gynecology

## 2014-04-20 DIAGNOSIS — N7093 Salpingitis and oophoritis, unspecified: Secondary | ICD-10-CM

## 2014-04-21 ENCOUNTER — Ambulatory Visit (INDEPENDENT_AMBULATORY_CARE_PROVIDER_SITE_OTHER): Payer: Medicaid Other | Admitting: Internal Medicine

## 2014-04-21 ENCOUNTER — Encounter: Payer: Self-pay | Admitting: Internal Medicine

## 2014-04-21 VITALS — BP 95/65 | HR 87 | Temp 98.2°F | Wt 112.5 lb

## 2014-04-21 DIAGNOSIS — N731 Chronic parametritis and pelvic cellulitis: Secondary | ICD-10-CM

## 2014-04-21 DIAGNOSIS — N739 Female pelvic inflammatory disease, unspecified: Secondary | ICD-10-CM

## 2014-04-21 NOTE — Progress Notes (Signed)
Patient ID: Heather Lane, female   DOB: 09/04/1991, 23 y.o.   MRN: [id number]         [facility]  Patient Active Problem List   Diagnosis Date Noted  . Pelvic abscess in female 03/21/2014  . Chlamydia 03/21/2014  . History of herpes genitalis 03/21/2014  . Gonorrhea 03/14/2014  . Previous cesarean delivery 01/27/2013  . Cigarette smoker 01/27/2013    Patient's Medications  New Prescriptions   No medications on file  Previous Medications   AMOXICILLIN-CLAVULANATE (AUGMENTIN) 875-125 MG PER TABLET    Take 1 tablet by mouth every 12 (twelve) hours.   IBUPROFEN (ADVIL,MOTRIN) 600 MG TABLET    Take 1 tablet (600 mg total) by mouth every 6 (six) hours as needed for mild pain or cramping.   MEDROXYPROGESTERONE (DEPO-PROVERA) 150 MG/ML INJECTION    Inject 1 mL (150 mg total) into the muscle every 3 (three) months.   NYSTATIN-TRIAMCINOLONE OINTMENT (MYCOLOG)    Apply 1 application topically 2 (two) times daily.   TETRAHYDROZOLINE-ZINC (VISINE-AC) 0.05-0.25 % OPHTHALMIC SOLUTION    Place 2 drops into both eyes 3 (three) times daily as needed (for itchy eyes).  Modified Medications   No medications on file  Discontinued Medications   No medications on file    Subjective: Heather Lane is in for her routine followup visit. She has now completed a total of 40 days of antibiotic therapy. The last 30 days have been targeted toward her large pelvic abscess. She has had no problems tolerating her Augmentin but is still having some peeling of her palms. Her lower abdominal pain has improved. She did have one episode of nausea and vomiting 3 days ago. Her appetite remains below normal. She states that she has been feeling somewhat depressed because of her illness. She was kicked out of school 2 days ago for missing so many of her classes. She is still not had her followup CT scan.  Review of Systems: Pertinent items are noted in HPI.  Past Medical History  Diagnosis Date    . Hepatitis C antibody test positive     this is written in admission paperwork in 2012.  needs to be confirmed with blood tests.   . Chlamydia trachomatis infection of genitourinary sites 2009  . HSV-1 (herpes simplex virus 1) infection   . Depression   . PID (pelvic inflammatory disease)     History  Substance Use Topics  . Smoking status: Current Some Day Smoker -- 1.00 packs/day    Types: Cigarettes    Last Attempt to Quit: 03/15/2014  . Smokeless tobacco: Never Used  . Alcohol Use: 1.8 oz/week    3 Glasses of wine per week    Family History  Problem Relation Age of Onset  . Diabetes Sister   . Diabetes Maternal Aunt   . Hypertension Maternal Aunt   . Diabetes Maternal Uncle   . Hypertension Maternal Uncle     No Known Allergies  Objective: Temp: 98.2 F (36.8 C) (05/20 1016) Temp src: Oral (05/20 1016) BP: 95/65 mmHg (05/20 1016) Pulse Rate: 87 (05/20 1016)  General:  She is smiling and in good spirits. Her weight is down 4 more pounds to 112. She has lost 16 pounds during this illness. Skin:  no change in her superficial peeling of her palms and wrists Lungs: Clear Cor:  regular S1 and S2 no murmurs Abdomen: Soft and nontender with quiet bowel sounds   Assessment: She is scheduled for followup CT  scan in 2 days. That information will be critical in determining if her abscess is resolving and whether she needs to continue antibiotic therapy.  Plan: 1.  continue Augmentin for now  2.  repeat CT scan on May 22  3.  followup here on May 26   Cliffton AstersJohn Elazar Argabright, MD [facility] [phone] pager   [phone] cell 04/21/2014, 10:43 AM

## 2014-04-23 ENCOUNTER — Ambulatory Visit
Admission: RE | Admit: 2014-04-23 | Discharge: 2014-04-23 | Disposition: A | Discharge status: Home / Self Care | Payer: Medicaid Other | Source: Ambulatory Visit | Attending: Obstetrics and Gynecology | Admitting: Obstetrics and Gynecology

## 2014-04-23 DIAGNOSIS — N7093 Salpingitis and oophoritis, unspecified: Secondary | ICD-10-CM

## 2014-04-23 MED ORDER — IOHEXOL 300 MG/ML  SOLN
100.0000 mL | Freq: Once | INTRAMUSCULAR | Status: AC | PRN
  Administered 2014-04-23: 100 mL via INTRAVENOUS

## 2014-04-27 ENCOUNTER — Ambulatory Visit: Payer: Medicaid Other | Admitting: Internal Medicine

## 2014-04-29 ENCOUNTER — Ambulatory Visit (INDEPENDENT_AMBULATORY_CARE_PROVIDER_SITE_OTHER): Payer: Medicaid Other | Admitting: Internal Medicine

## 2014-04-29 ENCOUNTER — Encounter: Payer: Self-pay | Admitting: Internal Medicine

## 2014-04-29 VITALS — BP 105/71 | HR 84 | Temp 98.0°F | Wt 112.8 lb

## 2014-04-29 DIAGNOSIS — N739 Female pelvic inflammatory disease, unspecified: Secondary | ICD-10-CM

## 2014-04-29 DIAGNOSIS — N731 Chronic parametritis and pelvic cellulitis: Secondary | ICD-10-CM

## 2014-04-29 NOTE — Progress Notes (Signed)
Patient ID: Heather Lane, female   DOB: 1991/06/21, 23 y.o.   MRN: [id number]         [facility]  Patient Active Problem List   Diagnosis Date Noted  . Pelvic abscess in female 03/21/2014  . Chlamydia 03/21/2014  . History of herpes genitalis 03/21/2014  . Gonorrhea 03/14/2014  . Previous cesarean delivery 01/27/2013  . Cigarette smoker 01/27/2013    Patient's Medications  New Prescriptions   No medications on file  Previous Medications   AMOXICILLIN-CLAVULANATE (AUGMENTIN) 875-125 MG PER TABLET    Take 1 tablet by mouth every 12 (twelve) hours.   IBUPROFEN (ADVIL,MOTRIN) 600 MG TABLET    Take 1 tablet (600 mg total) by mouth every 6 (six) hours as needed for mild pain or cramping.   MEDROXYPROGESTERONE (DEPO-PROVERA) 150 MG/ML INJECTION    Inject 1 mL (150 mg total) into the muscle every 3 (three) months.   NYSTATIN-TRIAMCINOLONE OINTMENT (MYCOLOG)    Apply 1 application topically 2 (two) times daily.   TETRAHYDROZOLINE-ZINC (VISINE-AC) 0.05-0.25 % OPHTHALMIC SOLUTION    Place 2 drops into both eyes 3 (three) times daily as needed (for itchy eyes).  Modified Medications   No medications on file  Discontinued Medications   No medications on file    Subjective: Heather Lane is in for her routine visit. She is now completed 40 days of total antibiotic therapy including 38 for her pelvic abscess. She continues to take Augmentin without difficulty. She has noticed improvement in the peeling rash on her hands and palms but is now had some slight peeling on the tops of both feet. It does not itch. She continues to have some intermittent lower abdominal pain that is relieved with ibuprofen. At her follow up CT scan on May 22. It showed resolution of the fluid between the bladder and uterus and decrease in size of the cul-de-sac abscess. She states that she continues to have some moments where she feels down and depressed but is feeling a little bit better. Her  appetite remains poor and Sunday she only needs one small meal.  Review of Systems: Pertinent items are noted in HPI.  Past Medical History  Diagnosis Date  . Hepatitis C antibody test positive     this is written in admission paperwork in 2012.  needs to be confirmed with blood tests.   . Chlamydia trachomatis infection of genitourinary sites 2009  . HSV-1 (herpes simplex virus 1) infection   . Depression   . PID (pelvic inflammatory disease)     History  Substance Use Topics  . Smoking status: Current Some Day Smoker -- 1.00 packs/day    Types: Cigarettes    Last Attempt to Quit: 03/15/2014  . Smokeless tobacco: Never Used  . Alcohol Use: 1.8 oz/week    3 Glasses of wine per week    Family History  Problem Relation Age of Onset  . Diabetes Sister   . Diabetes Maternal Aunt   . Hypertension Maternal Aunt   . Diabetes Maternal Uncle   . Hypertension Maternal Uncle     No Known Allergies  Objective: Temp: 98 F (36.7 C) (05/28 1005) Temp src: Oral (05/28 1005) BP: 105/71 mmHg (05/28 1005) Pulse Rate: 84 (05/28 1005)  General: Her weight remains down at 112 pounds Skin: Mild superficial peeling noted on the dorsum of both feet. The peeling on her palms has improved. She is no other rash Lungs: Clear Cor: Regular S1 and S2 with no murmurs  Abdomen: Soft and nontender  CT ABDOMEN AND PELVIS WITH CONTRAST 04/23/2014   COMPARISON: 03/24/2014  FINDINGS:  The lung bases are clear. There is no pleural effusion or  pericardial effusion.  No liver abnormality identified. The gallbladder appears normal.  Prominence of the common bile duct is again noted and appears  similar to previous exam measuring up to 4 mm, image 24/ series 2.  Normal appearance of the pancreas. The spleen is unremarkable.  The adrenal glands are normal. The kidneys are both on unremarkable.  The urinary bladder appears normal. The uterus appears normal.  Further decrease in size of fluid  collection within the cul-de-sac.  This measures 4.4 x 1.0 x 2.9 cm. Previously 5.5 x 1.3 x 3.3 cm. The  fluid collection between the bladder and uterus has resolved. No new  fluid collections identified.  Normal caliber of the abdominal aorta. No adenopathy. There is no  pelvic or inguinal adenopathy identified.  The stomach is normal. The small bowel loops have a normal course  and caliber. No obstruction. Normal appearance of the colon.  Review of the visualized bony structures is unremarkable.   IMPRESSION:  1. Further decrease in size of pelvic fluid collection.  2. No new findings identified.   By: Signa Kellaylor Stroud M.D.  On: 04/23/2014 12:05       Assessment: She is having slow resolution of her pelvic abscesses. I will continue Augmentin and see her back in 2 weeks. She will likely need a followup scan in several more weeks. I'm not sure what is causing her peeling skin. It is possible it could be an adverse reaction to Augmentin but it is very mild and I will not make any changes in her antibiotic therapy now.  Plan: 1. Continue Augmentin 2. Followup in 2 weeks   Cliffton AstersJohn Maxamillian Tienda, MD [facility] [phone] pager   [phone] cell 04/29/2014, 10:19 AM

## 2014-05-18 ENCOUNTER — Ambulatory Visit: Payer: Medicaid Other | Admitting: Internal Medicine

## 2014-05-18 ENCOUNTER — Telehealth: Payer: Self-pay | Admitting: *Deleted

## 2014-05-18 NOTE — Telephone Encounter (Signed)
phone does not accept incoming calls

## 2014-05-25 ENCOUNTER — Encounter: Payer: Self-pay | Admitting: Internal Medicine

## 2014-05-25 ENCOUNTER — Ambulatory Visit (INDEPENDENT_AMBULATORY_CARE_PROVIDER_SITE_OTHER): Payer: Medicaid Other | Admitting: Internal Medicine

## 2014-05-25 VITALS — BP 105/66 | HR 101 | Temp 98.2°F | Wt 119.0 lb

## 2014-05-25 DIAGNOSIS — N731 Chronic parametritis and pelvic cellulitis: Secondary | ICD-10-CM

## 2014-05-25 DIAGNOSIS — N739 Female pelvic inflammatory disease, unspecified: Secondary | ICD-10-CM

## 2014-05-25 NOTE — Progress Notes (Signed)
Patient ID: Heather Lane, female   DOB: 1991-05-25, 23 y.o.   MRN: [id number]         [facility]  Patient Active Problem List   Diagnosis Date Noted  . Pelvic abscess in female 03/21/2014  . Chlamydia 03/21/2014  . History of herpes genitalis 03/21/2014  . Gonorrhea 03/14/2014  . Previous cesarean delivery 01/27/2013  . Cigarette smoker 01/27/2013    Patient's Medications  New Prescriptions   No medications on file  Previous Medications   AMOXICILLIN-CLAVULANATE (AUGMENTIN) 875-125 MG PER TABLET    Take 1 tablet by mouth every 12 (twelve) hours.   IBUPROFEN (ADVIL,MOTRIN) 600 MG TABLET    Take 1 tablet (600 mg total) by mouth every 6 (six) hours as needed for mild pain or cramping.   MEDROXYPROGESTERONE (DEPO-PROVERA) 150 MG/ML INJECTION    Inject 1 mL (150 mg total) into the muscle every 3 (three) months.   NYSTATIN-TRIAMCINOLONE OINTMENT (MYCOLOG)    Apply 1 application topically 2 (two) times daily.   TETRAHYDROZOLINE-ZINC (VISINE-AC) 0.05-0.25 % OPHTHALMIC SOLUTION    Place 2 drops into both eyes 3 (three) times daily as needed (for itchy eyes).  Modified Medications   No medications on file  Discontinued Medications   No medications on file    Subjective: Heather Lane is in for her routine followup visit. She is now completed a 65 days of total antibiotic therapy for her PID and pelvic abscesses. She is feeling much better. Her appetite has returned to normal and she is having no more abdominal pain. She's had no problems tolerating her Augmentin. The peeling on her palms and feet resolve spontaneously. Review of Systems: Pertinent items are noted in HPI.  Past Medical History  Diagnosis Date  . Hepatitis C antibody test positive     this is written in admission paperwork in 2012.  needs to be confirmed with blood tests.   . Chlamydia trachomatis infection of genitourinary sites 2009  . HSV-1 (herpes simplex virus 1) infection   . Depression    . PID (pelvic inflammatory disease)     History  Substance Use Topics  . Smoking status: Current Some Day Smoker -- 1.00 packs/day    Types: Cigarettes    Last Attempt to Quit: 03/15/2014  . Smokeless tobacco: Never Used  . Alcohol Use: 1.8 oz/week    3 Glasses of wine per week    Family History  Problem Relation Age of Onset  . Diabetes Sister   . Diabetes Maternal Aunt   . Hypertension Maternal Aunt   . Diabetes Maternal Uncle   . Hypertension Maternal Uncle     No Known Allergies  Objective: Temp: 98.2 F (36.8 C) (06/23 1551) Temp src: Oral (06/23 1551) BP: 105/66 mmHg (06/23 1551) Pulse Rate: 101 (06/23 1551)  General: She is smiling and in good spirits Skin: Peeling rash resolved Lungs: Clear Cor: Regular S1 and S2 no murmurs Abdomen: Soft and nontender   Assessment: She is a good clinical response to lumbar 2 months of therapy for her pelvic abscesses. A repeat lab work today and obtain a followup CT scan. I am hopeful that her infection is cured and she can stop Augmentin soon.  Plan: 1. Check CBC and basic metabolic panel 2. Repeat pelvic CT with and without contrast 3. I will call her once I know the CT scan results   Cliffton AstersJohn Campbell, MD [facility] [phone] pager   [phone] cell  05/25/2014, 4:21 PM

## 2014-05-26 ENCOUNTER — Other Ambulatory Visit: Payer: Self-pay | Admitting: Internal Medicine

## 2014-05-26 ENCOUNTER — Other Ambulatory Visit: Payer: Medicaid Other

## 2014-05-26 ENCOUNTER — Encounter: Payer: Self-pay | Admitting: *Deleted

## 2014-05-26 DIAGNOSIS — N739 Female pelvic inflammatory disease, unspecified: Secondary | ICD-10-CM

## 2014-05-26 LAB — BASIC METABOLIC PANEL
BUN: 13 mg/dL (ref 6–23)
CO2: 26 mEq/L (ref 19–32)
Calcium: 9.5 mg/dL (ref 8.4–10.5)
Chloride: 106 mEq/L (ref 96–112)
Creat: 0.97 mg/dL (ref 0.50–1.10)
Glucose, Bld: 93 mg/dL (ref 70–99)
POTASSIUM: 4.1 meq/L (ref 3.5–5.3)
SODIUM: 140 meq/L (ref 135–145)

## 2014-05-26 LAB — CBC
HCT: 40.4 % (ref 36.0–46.0)
Hemoglobin: 13.9 g/dL (ref 12.0–15.0)
MCH: 29.7 pg (ref 26.0–34.0)
MCHC: 34.4 g/dL (ref 30.0–36.0)
MCV: 86.3 fL (ref 78.0–100.0)
Platelets: 254 10*3/uL (ref 150–400)
RBC: 4.68 MIL/uL (ref 3.87–5.11)
RDW: 15.1 % (ref 11.5–15.5)
WBC: 10.9 10*3/uL — ABNORMAL HIGH (ref 4.0–10.5)

## 2014-05-28 ENCOUNTER — Ambulatory Visit (HOSPITAL_COMMUNITY): Admission: RE | Admit: 2014-05-28 | Payer: Medicaid Other | Source: Ambulatory Visit

## 2014-06-08 NOTE — Addendum Note (Signed)
Addended by: Jennet MaduroESTRIDGE, Lorean Ekstrand D on: 06/08/2014 05:10 PM   Modules accepted: Orders

## 2014-06-09 ENCOUNTER — Ambulatory Visit (HOSPITAL_COMMUNITY): Payer: Medicaid Other

## 2014-06-09 ENCOUNTER — Ambulatory Visit (HOSPITAL_COMMUNITY): Admission: RE | Admit: 2014-06-09 | Payer: Medicaid Other | Source: Ambulatory Visit

## 2014-06-11 ENCOUNTER — Ambulatory Visit (HOSPITAL_COMMUNITY): Admission: RE | Admit: 2014-06-11 | Payer: Medicaid Other | Source: Ambulatory Visit

## 2014-07-03 ENCOUNTER — Encounter (HOSPITAL_COMMUNITY): Payer: Self-pay | Admitting: Emergency Medicine

## 2014-07-03 ENCOUNTER — Emergency Department (HOSPITAL_COMMUNITY)
Admission: EM | Admit: 2014-07-03 | Discharge: 2014-07-03 | Disposition: A | Discharge status: Home / Self Care | Payer: Medicaid Other | Attending: Emergency Medicine | Admitting: Emergency Medicine

## 2014-07-03 DIAGNOSIS — Z3202 Encounter for pregnancy test, result negative: Secondary | ICD-10-CM | POA: Insufficient documentation

## 2014-07-03 DIAGNOSIS — Z8659 Personal history of other mental and behavioral disorders: Secondary | ICD-10-CM | POA: Diagnosis not present

## 2014-07-03 DIAGNOSIS — F17200 Nicotine dependence, unspecified, uncomplicated: Secondary | ICD-10-CM | POA: Diagnosis not present

## 2014-07-03 DIAGNOSIS — Z8742 Personal history of other diseases of the female genital tract: Secondary | ICD-10-CM | POA: Diagnosis not present

## 2014-07-03 DIAGNOSIS — N390 Urinary tract infection, site not specified: Secondary | ICD-10-CM | POA: Diagnosis not present

## 2014-07-03 DIAGNOSIS — Z8619 Personal history of other infectious and parasitic diseases: Secondary | ICD-10-CM | POA: Insufficient documentation

## 2014-07-03 DIAGNOSIS — R350 Frequency of micturition: Secondary | ICD-10-CM | POA: Diagnosis present

## 2014-07-03 LAB — URINALYSIS, ROUTINE W REFLEX MICROSCOPIC
Bilirubin Urine: NEGATIVE
GLUCOSE, UA: NEGATIVE mg/dL
Ketones, ur: NEGATIVE mg/dL
NITRITE: NEGATIVE
PH: 6 (ref 5.0–8.0)
Protein, ur: NEGATIVE mg/dL
Specific Gravity, Urine: 1.029 (ref 1.005–1.030)
Urobilinogen, UA: 0.2 mg/dL (ref 0.0–1.0)

## 2014-07-03 LAB — URINE MICROSCOPIC-ADD ON

## 2014-07-03 LAB — POC URINE PREG, ED: Preg Test, Ur: NEGATIVE

## 2014-07-03 MED ORDER — PHENAZOPYRIDINE HCL 200 MG PO TABS: 200.0000 mg | ORAL_TABLET | Freq: Three times a day (TID) | ORAL | Status: DC

## 2014-07-03 MED ORDER — NITROFURANTOIN MONOHYD MACRO 100 MG PO CAPS: 100.0000 mg | ORAL_CAPSULE | Freq: Two times a day (BID) | ORAL | Status: DC

## 2014-07-03 NOTE — ED Notes (Signed)
Pt complains of increased urinary frequency

## 2014-07-03 NOTE — ED Provider Notes (Signed)
CSN: [id number]     Arrival date & time 07/03/14  4098 History   First MD Initiated Contact with Patient 07/03/14 [phone]     Chief Complaint  Patient presents with  . Urinary Frequency     (Consider location/radiation/quality/duration/timing/severity/associated sxs/prior Treatment) HPI Comments: Patient presents with a chief complaint of increased urinary frequency and urgency.  She reports that she began having symptoms this morning.  She states that she has had a UTI in the past and that symptoms feel similar.  She denies dysuria, hematuria, abdominal pain, flank pain, fever, chills, nausea, vomiting, or vaginal discharge.  She has not taken anything for her symptoms prior to arrival.  She is currently on Depo Provera for contraception.    Patient is a 23 y.o. female presenting with frequency. The history is provided by the patient.  Urinary Frequency The problem has been unchanged. Pertinent negatives include no abdominal pain, chills, fever, nausea or vomiting.    Past Medical History  Diagnosis Date  . Hepatitis C antibody test positive     this is written in admission paperwork in 2012.  needs to be confirmed with blood tests.   . Chlamydia trachomatis infection of genitourinary sites 2009  . HSV-1 (herpes simplex virus 1) infection   . Depression   . PID (pelvic inflammatory disease)    Past Surgical History  Procedure Laterality Date  . Cesarean section  05/2011   Family History  Problem Relation Age of Onset  . Diabetes Sister   . Diabetes Maternal Aunt   . Hypertension Maternal Aunt   . Diabetes Maternal Uncle   . Hypertension Maternal Uncle    History  Substance Use Topics  . Smoking status: Current Some Day Smoker -- 1.00 packs/day    Types: Cigarettes    Last Attempt to Quit: 03/15/2014  . Smokeless tobacco: Never Used  . Alcohol Use: 1.8 oz/week    3 Glasses of wine per week   OB History   Grav Para Term Preterm Abortions TAB SAB Ect Mult Living   1 1         1      Review of Systems  Constitutional: Negative for fever and chills.  Gastrointestinal: Negative for nausea, vomiting and abdominal pain.  Genitourinary: Positive for urgency and frequency. Negative for dysuria, hematuria, flank pain, vaginal bleeding, vaginal discharge and difficulty urinating.  All other systems reviewed and are negative.     Allergies  Pollen extract  Home Medications   Prior to Admission medications   Medication Sig Start Date End Date Taking? Authorizing Provider  medroxyPROGESTERone (DEPO-PROVERA) 150 MG/ML injection Inject 1 mL (150 mg total) into the muscle every 3 (three) months. 03/25/14  Yes Kirkland Hun, MD   BP 114/72  Pulse 95  Temp(Src) 97.8 F (36.6 C) (Oral)  Resp 20  Ht 5\' 4"  (1.626 m)  Wt 120 lb (54.432 kg)  BMI 20.59 kg/m2  SpO2 99%  LMP 07/03/2014 Physical Exam  Nursing note and vitals reviewed. Constitutional: She appears well-developed and well-nourished.  HENT:  Head: Normocephalic and atraumatic.  Mouth/Throat: Oropharynx is clear and moist.  Neck: Normal range of motion. Neck supple.  Cardiovascular: Normal rate, regular rhythm and normal heart sounds.   Pulmonary/Chest: Effort normal and breath sounds normal.  Abdominal: Soft. Bowel sounds are normal. She exhibits no distension and no mass. There is no tenderness. There is no rebound, no guarding and no CVA tenderness.  Musculoskeletal: Normal range of motion.  Neurological: She is alert.  Skin: Skin is warm and dry.  Psychiatric: She has a normal mood and affect.    ED Course  Procedures (including critical care time) Labs Review Labs Reviewed  URINALYSIS, ROUTINE W REFLEX MICROSCOPIC  POC URINE PREG, ED    Imaging Review No results found.   EKG Interpretation None      MDM   Final diagnoses:  None   Patient presenting with urinary symptoms.  UA showing UTI.  Urine sent for culture.  Previous urine cultures reviewed, but previous cultures did not  show significant growth.  Urine pregnancy negative.  Patient afebrile without signs of Pyelonephritis at this time.  Patient given Rx for Macrobid.  Patient stable for discharge.  Return precautions given.    Santiago GladHeather Daevion Navarette, PA-C 07/03/14 [phone]

## 2014-07-03 NOTE — ED Notes (Signed)
Pt ambulated to BR and back to room w/o assistance and reports that she had some pink on toilet paper when she wiped. Pt in NAD. PA at bedside

## 2014-07-05 NOTE — ED Provider Notes (Signed)
Medical screening examination/treatment/procedure(s) were performed by non-physician practitioner and as supervising physician I was immediately available for consultation/collaboration.   EKG Interpretation None       Tyshay Adee, MD 07/05/14 1106 

## 2014-07-06 LAB — URINE CULTURE
Colony Count: 25000
Special Requests: NORMAL

## 2014-07-08 ENCOUNTER — Telehealth (HOSPITAL_BASED_OUTPATIENT_CLINIC_OR_DEPARTMENT_OTHER): Payer: Self-pay | Admitting: Emergency Medicine

## 2014-07-08 NOTE — Telephone Encounter (Signed)
Post ED Visit - Positive Culture Follow-up  Culture report reviewed by antimicrobial stewardship pharmacist: []  Wes Dulaney, Pharm.D., BCPS []  Celedonio MiyamotoJeremy Frens, [address] BCPS [x]  Georgina PillionElizabeth Martin, Pharm.D., BCPS []  [place] BullionMinh Pham, [address] BCPS, AAHIVP []  Estella HuskMichelle Turner, Pharm.D., BCPS, AAHIVP []  Red ChristiansSamson Lee, Pharm.D. []  Tennis Mustassie Stewart, [place]  Positive urine culture Treated with Macrobid, organism sensitive to the same and no further patient follow-up is required at this time.  Zeb ComfortHolland, Franchelle Foskett 07/08/2014, 6:21 PM

## 2014-07-26 ENCOUNTER — Telehealth: Payer: Self-pay | Admitting: *Deleted

## 2014-07-26 NOTE — Telephone Encounter (Signed)
Message copied by Troy Sine on Mon Jul 26, 2014  4:55 PM ------      Message from: Cliffton Asters      Created: Fri Jul 23, 2014 10:36 AM       Angelique Blonder,            Please call Ms. Seivert sometime next week when you have a chance. She never got her followup CT scan to assess her pelvic abscess. Ask her how she is doing and when she stopped taking her Augmentin. I assume she is off all antibiotics. If she is doing well we do not need the followup scan. Thanks.            John ------

## 2014-07-26 NOTE — Telephone Encounter (Signed)
Requested that pt return call to let Dr. Orvan Falconer know how she is doing, is she having any lower abdominal pain, and has she completed Augmentin.

## 2014-10-04 ENCOUNTER — Encounter (HOSPITAL_COMMUNITY): Payer: Self-pay | Admitting: Emergency Medicine

## 2015-02-01 ENCOUNTER — Emergency Department (HOSPITAL_COMMUNITY)
Admission: EM | Admit: 2015-02-01 | Discharge: 2015-02-02 | Disposition: A | Discharge status: Home / Self Care | Payer: Medicaid Other | Attending: Emergency Medicine | Admitting: Emergency Medicine

## 2015-02-01 ENCOUNTER — Encounter (HOSPITAL_COMMUNITY): Payer: Self-pay | Admitting: *Deleted

## 2015-02-01 ENCOUNTER — Emergency Department (HOSPITAL_COMMUNITY): Payer: Medicaid Other

## 2015-02-01 DIAGNOSIS — Z7952 Long term (current) use of systemic steroids: Secondary | ICD-10-CM | POA: Diagnosis not present

## 2015-02-01 DIAGNOSIS — Z8742 Personal history of other diseases of the female genital tract: Secondary | ICD-10-CM | POA: Diagnosis not present

## 2015-02-01 DIAGNOSIS — R0602 Shortness of breath: Secondary | ICD-10-CM | POA: Diagnosis present

## 2015-02-01 DIAGNOSIS — Z792 Long term (current) use of antibiotics: Secondary | ICD-10-CM | POA: Insufficient documentation

## 2015-02-01 DIAGNOSIS — Z8619 Personal history of other infectious and parasitic diseases: Secondary | ICD-10-CM | POA: Diagnosis not present

## 2015-02-01 DIAGNOSIS — J209 Acute bronchitis, unspecified: Secondary | ICD-10-CM | POA: Insufficient documentation

## 2015-02-01 DIAGNOSIS — Z8659 Personal history of other mental and behavioral disorders: Secondary | ICD-10-CM | POA: Diagnosis not present

## 2015-02-01 DIAGNOSIS — Z720 Tobacco use: Secondary | ICD-10-CM | POA: Diagnosis not present

## 2015-02-01 MED ORDER — IPRATROPIUM-ALBUTEROL 0.5-2.5 (3) MG/3ML IN SOLN
3.0000 mL | Freq: Once | RESPIRATORY_TRACT | Status: AC
  Administered 2015-02-01: 3 mL via RESPIRATORY_TRACT
  Filled 2015-02-01: qty 3

## 2015-02-01 MED ORDER — ACETAMINOPHEN 325 MG PO TABS
650.0000 mg | ORAL_TABLET | Freq: Once | ORAL | Status: AC
  Administered 2015-02-01: 650 mg via ORAL
  Filled 2015-02-01: qty 2

## 2015-02-01 MED ORDER — ALBUTEROL SULFATE (2.5 MG/3ML) 0.083% IN NEBU
5.0000 mg | INHALATION_SOLUTION | Freq: Once | RESPIRATORY_TRACT | Status: AC
  Administered 2015-02-01: 5 mg via RESPIRATORY_TRACT
  Filled 2015-02-01: qty 6

## 2015-02-01 NOTE — ED Notes (Signed)
Pt complains of being short of breath, pt is hyperventilating in triage and family is hysterical, pt is satting 98% on room air, no history of asthma

## 2015-02-01 NOTE — ED Notes (Addendum)
Pt states she has had fever, cough, lethargy, emesis since Sunday. Pt states began feeling sick after attending a basketball game on Saturday. Pt states she feels pressure in her chest when she breathes.

## 2015-02-02 ENCOUNTER — Emergency Department (HOSPITAL_COMMUNITY)
Admission: EM | Admit: 2015-02-02 | Discharge: 2015-02-02 | Disposition: A | Discharge status: Home / Self Care | Payer: Medicaid Other | Source: Home / Self Care | Attending: Emergency Medicine | Admitting: Emergency Medicine

## 2015-02-02 ENCOUNTER — Encounter (HOSPITAL_COMMUNITY): Payer: Self-pay | Admitting: *Deleted

## 2015-02-02 DIAGNOSIS — J111 Influenza due to unidentified influenza virus with other respiratory manifestations: Secondary | ICD-10-CM | POA: Insufficient documentation

## 2015-02-02 DIAGNOSIS — Z8742 Personal history of other diseases of the female genital tract: Secondary | ICD-10-CM | POA: Insufficient documentation

## 2015-02-02 DIAGNOSIS — Z8659 Personal history of other mental and behavioral disorders: Secondary | ICD-10-CM | POA: Insufficient documentation

## 2015-02-02 DIAGNOSIS — R69 Illness, unspecified: Principal | ICD-10-CM

## 2015-02-02 DIAGNOSIS — Z8619 Personal history of other infectious and parasitic diseases: Secondary | ICD-10-CM | POA: Insufficient documentation

## 2015-02-02 DIAGNOSIS — Z720 Tobacco use: Secondary | ICD-10-CM | POA: Insufficient documentation

## 2015-02-02 LAB — I-STAT CG4 LACTIC ACID, ED
Lactic Acid, Venous: 2.76 mmol/L (ref 0.5–2.0)
Lactic Acid, Venous: 2.95 mmol/L (ref 0.5–2.0)
Lactic Acid, Venous: 1.21 mmol/L (ref 0.5–2.0)

## 2015-02-02 LAB — URINE MICROSCOPIC-ADD ON

## 2015-02-02 LAB — URINALYSIS, ROUTINE W REFLEX MICROSCOPIC
Glucose, UA: NEGATIVE mg/dL
Ketones, ur: 15 mg/dL — AB
Leukocytes, UA: NEGATIVE
Nitrite: NEGATIVE
PH: 5.5 (ref 5.0–8.0)
Protein, ur: 100 mg/dL — AB
Specific Gravity, Urine: 1.029 (ref 1.005–1.030)
UROBILINOGEN UA: 1 mg/dL (ref 0.0–1.0)

## 2015-02-02 LAB — COMPREHENSIVE METABOLIC PANEL
ALT: 18 U/L (ref 0–35)
AST: 34 U/L (ref 0–37)
Albumin: 4.3 g/dL (ref 3.5–5.2)
Alkaline Phosphatase: 45 U/L (ref 39–117)
Anion gap: 13 (ref 5–15)
BUN: 12 mg/dL (ref 6–23)
CHLORIDE: 101 mmol/L (ref 96–112)
CO2: 22 mmol/L (ref 19–32)
Calcium: 9.3 mg/dL (ref 8.4–10.5)
Creatinine, Ser: 1.13 mg/dL — ABNORMAL HIGH (ref 0.50–1.10)
GFR calc Af Amer: 79 mL/min — ABNORMAL LOW (ref >90)
GFR, EST NON AFRICAN AMERICAN: 68 mL/min — AB (ref >90)
Glucose, Bld: 144 mg/dL — ABNORMAL HIGH (ref 70–99)
POTASSIUM: 3.5 mmol/L (ref 3.5–5.1)
Sodium: 136 mmol/L (ref 135–145)
Total Bilirubin: 0.6 mg/dL (ref 0.3–1.2)
Total Protein: 7.4 g/dL (ref 6.0–8.3)

## 2015-02-02 LAB — CBC
HEMATOCRIT: 39 % (ref 36.0–46.0)
Hemoglobin: 14.3 g/dL (ref 12.0–15.0)
MCH: 30.8 pg (ref 26.0–34.0)
MCHC: 36.7 g/dL — AB (ref 30.0–36.0)
MCV: 84.1 fL (ref 78.0–100.0)
Platelets: 203 10*3/uL (ref 150–400)
RBC: 4.64 MIL/uL (ref 3.87–5.11)
RDW: 14 % (ref 11.5–15.5)
WBC: 19.8 10*3/uL — ABNORMAL HIGH (ref 4.0–10.5)

## 2015-02-02 LAB — I-STAT BETA HCG BLOOD, ED (MC, WL, AP ONLY): I-stat hCG, quantitative: <5 m[IU]/mL (ref <5)

## 2015-02-02 MED ORDER — MORPHINE SULFATE 4 MG/ML IJ SOLN
4.0000 mg | Freq: Once | INTRAMUSCULAR | Status: AC
  Administered 2015-02-02: 4 mg via INTRAVENOUS
  Filled 2015-02-02: qty 1

## 2015-02-02 MED ORDER — DEXAMETHASONE SODIUM PHOSPHATE 10 MG/ML IJ SOLN
10.0000 mg | Freq: Once | INTRAMUSCULAR | Status: AC
  Administered 2015-02-02: 10 mg via INTRAVENOUS
  Filled 2015-02-02: qty 1

## 2015-02-02 MED ORDER — HYDROCODONE-ACETAMINOPHEN 5-325 MG PO TABS: ORAL_TABLET | ORAL | Status: DC

## 2015-02-02 MED ORDER — PREDNISONE 20 MG PO TABS
60.0000 mg | ORAL_TABLET | Freq: Once | ORAL | Status: AC
  Administered 2015-02-02: 60 mg via ORAL
  Filled 2015-02-02: qty 3

## 2015-02-02 MED ORDER — ALBUTEROL SULFATE HFA 108 (90 BASE) MCG/ACT IN AERS
1.0000 | INHALATION_SPRAY | RESPIRATORY_TRACT | Status: DC | PRN
Start: 2015-02-02 — End: 2015-02-02
  Administered 2015-02-02: 2 via RESPIRATORY_TRACT
  Filled 2015-02-02: qty 6.7

## 2015-02-02 MED ORDER — ONDANSETRON HCL 4 MG PO TABS: 4.0000 mg | ORAL_TABLET | Freq: Three times a day (TID) | ORAL | Status: DC | PRN

## 2015-02-02 MED ORDER — IPRATROPIUM-ALBUTEROL 0.5-2.5 (3) MG/3ML IN SOLN
3.0000 mL | Freq: Once | RESPIRATORY_TRACT | Status: AC
  Administered 2015-02-02: 3 mL via RESPIRATORY_TRACT
  Filled 2015-02-02: qty 3

## 2015-02-02 MED ORDER — IBUPROFEN 200 MG PO TABS
600.0000 mg | ORAL_TABLET | Freq: Once | ORAL | Status: AC
  Administered 2015-02-02: 600 mg via ORAL
  Filled 2015-02-02: qty 3

## 2015-02-02 MED ORDER — OSELTAMIVIR PHOSPHATE 75 MG PO CAPS: 75.0000 mg | ORAL_CAPSULE | Freq: Two times a day (BID) | ORAL | Status: DC

## 2015-02-02 MED ORDER — SODIUM CHLORIDE 0.9 % IV BOLUS (SEPSIS)
2000.0000 mL | Freq: Once | INTRAVENOUS | Status: AC
  Administered 2015-02-02: 2000 mL via INTRAVENOUS

## 2015-02-02 MED ORDER — AZITHROMYCIN 250 MG PO TABS: ORAL_TABLET | ORAL | Status: DC

## 2015-02-02 MED ORDER — PREDNISONE 20 MG PO TABS: ORAL_TABLET | ORAL | Status: DC

## 2015-02-02 MED ORDER — DEXTROSE 5 % IV SOLN
1.0000 g | Freq: Once | INTRAVENOUS | Status: AC
  Administered 2015-02-02: 1 g via INTRAVENOUS
  Filled 2015-02-02: qty 10

## 2015-02-02 MED ORDER — CEFTRIAXONE SODIUM 1 G IJ SOLR
1.0000 g | Freq: Once | INTRAMUSCULAR | Status: DC
  Filled 2015-02-02: qty 10

## 2015-02-02 MED ORDER — OSELTAMIVIR PHOSPHATE 75 MG PO CAPS
75.0000 mg | ORAL_CAPSULE | Freq: Once | ORAL | Status: AC
  Administered 2015-02-02: 75 mg via ORAL
  Filled 2015-02-02: qty 1

## 2015-02-02 MED ORDER — AZITHROMYCIN 250 MG PO TABS
500.0000 mg | ORAL_TABLET | Freq: Once | ORAL | Status: AC
  Administered 2015-02-02: 500 mg via ORAL
  Filled 2015-02-02: qty 2

## 2015-02-02 MED ORDER — ACETAMINOPHEN 325 MG PO TABS
325.0000 mg | ORAL_TABLET | Freq: Once | ORAL | Status: AC
  Administered 2015-02-02: 325 mg via ORAL
  Filled 2015-02-02: qty 1

## 2015-02-02 MED ORDER — ACETAMINOPHEN 325 MG PO TABS
650.0000 mg | ORAL_TABLET | Freq: Once | ORAL | Status: AC
  Administered 2015-02-02: 650 mg via ORAL

## 2015-02-02 MED ORDER — HYDROCOD POLST-CHLORPHEN POLST 10-8 MG/5ML PO LQCR
5.0000 mL | Freq: Once | ORAL | Status: AC
  Administered 2015-02-02: 5 mL via ORAL
  Filled 2015-02-02: qty 5

## 2015-02-02 MED ORDER — ACETAMINOPHEN 325 MG PO TABS
ORAL_TABLET | ORAL | Status: AC
  Filled 2015-02-02: qty 2

## 2015-02-02 MED ORDER — ONDANSETRON HCL 4 MG/2ML IJ SOLN
4.0000 mg | Freq: Once | INTRAMUSCULAR | Status: AC
  Administered 2015-02-02: 4 mg via INTRAVENOUS
  Filled 2015-02-02: qty 2

## 2015-02-02 NOTE — Discharge Instructions (Signed)
Acute Bronchitis Bronchitis is inflammation of the airways that extend from the windpipe into the lungs (bronchi). The inflammation often causes mucus to develop. This leads to a cough, which is the most common symptom of bronchitis.  In acute bronchitis, the condition usually develops suddenly and goes away over time, usually in a couple weeks. Smoking, allergies, and asthma can make bronchitis worse. Repeated episodes of bronchitis may cause further lung problems.  CAUSES Acute bronchitis is most often caused by the same virus that causes a cold. The virus can spread from person to person (contagious) through coughing, sneezing, and touching contaminated objects. SIGNS AND SYMPTOMS   Cough.   Fever.   Coughing up mucus.   Body aches.   Chest congestion.   Chills.   Shortness of breath.   Sore throat.  DIAGNOSIS  Acute bronchitis is usually diagnosed through a physical exam. Your health care provider will also ask you questions about your medical history. Tests, such as chest X-rays, are sometimes done to rule out other conditions.  TREATMENT  Acute bronchitis usually goes away in a couple weeks. Oftentimes, no medical treatment is necessary. Medicines are sometimes given for relief of fever or cough. Antibiotic medicines are usually not needed but may be prescribed in certain situations. In some cases, an inhaler may be recommended to help reduce shortness of breath and control the cough. A cool mist vaporizer may also be used to help thin bronchial secretions and make it easier to clear the chest.  HOME CARE INSTRUCTIONS  Get plenty of rest.   Drink enough fluids to keep your urine clear or pale yellow (unless you have a medical condition that requires fluid restriction). Increasing fluids may help thin your respiratory secretions (sputum) and reduce chest congestion, and it will prevent dehydration.   Take medicines only as directed by your health care provider.  If  you were prescribed an antibiotic medicine, finish it all even if you start to feel better.  Avoid smoking and secondhand smoke. Exposure to cigarette smoke or irritating chemicals will make bronchitis worse. If you are a smoker, consider using nicotine gum or skin patches to help control withdrawal symptoms. Quitting smoking will help your lungs heal faster.   Reduce the chances of another bout of acute bronchitis by washing your hands frequently, avoiding people with cold symptoms, and trying not to touch your hands to your mouth, nose, or eyes.   Keep all follow-up visits as directed by your health care provider.  SEEK MEDICAL CARE IF: Your symptoms do not improve after 1 week of treatment.  SEEK IMMEDIATE MEDICAL CARE IF:  You develop an increased fever or chills.   You have chest pain.   You have severe shortness of breath.  You have bloody sputum.   You develop dehydration.  You faint or repeatedly feel like you are going to pass out.  You develop repeated vomiting.  You develop a severe headache. MAKE SURE YOU:   Understand these instructions.  Will watch your condition.  Will get help right away if you are not doing well or get worse. Document Released: 12/27/2004 Document Revised: 04/05/2014 Document Reviewed: 05/12/2013 ExitCare Patient Information 2015 [employer]. This information is not intended to replace advice given to you by your health care provider. Make sure you discuss any questions you have with your health care provider. Bronchospasm A bronchospasm is a spasm or tightening of the airways going into the lungs. During a bronchospasm breathing becomes more difficult because the   airways get smaller. When this happens there can be coughing, a whistling sound when breathing (wheezing), and difficulty breathing. Bronchospasm is often associated with asthma, but not all patients who experience a bronchospasm have asthma. CAUSES  A bronchospasm is caused  by inflammation or irritation of the airways. The inflammation or irritation may be triggered by:   Allergies (such as to animals, pollen, food, or mold). Allergens that cause bronchospasm may cause wheezing immediately after exposure or many hours later.   Infection. Viral infections are believed to be the most common cause of bronchospasm.   Exercise.   Irritants (such as pollution, cigarette smoke, strong odors, aerosol sprays, and paint fumes).   Weather changes. Winds increase molds and pollens in the air. Rain refreshes the air by washing irritants out. Cold air may cause inflammation.   Stress and emotional upset.  SIGNS AND SYMPTOMS   Wheezing.   Excessive nighttime coughing.   Frequent or severe coughing with a simple cold.   Chest tightness.   Shortness of breath.  DIAGNOSIS  Bronchospasm is usually diagnosed through a history and physical exam. Tests, such as chest X-rays, are sometimes done to look for other conditions. TREATMENT   Inhaled medicines can be given to open up your airways and help you breathe. The medicines can be given using either an inhaler or a nebulizer machine.  Corticosteroid medicines may be given for severe bronchospasm, usually when it is associated with asthma. HOME CARE INSTRUCTIONS   Always have a plan prepared for seeking medical care. Know when to call your health care provider and local emergency services (911 in the [place] Know where you can access local emergency care.  Only take medicines as directed by your health care provider.  If you were prescribed an inhaler or nebulizer machine, ask your health care provider to explain how to use it correctly. Always use a spacer with your inhaler if you were given one.  It is necessary to remain calm during an attack. Try to relax and breathe more slowly.  Control your home environment in the following ways:   Change your heating and air conditioning filter at least once a  month.   Limit your use of fireplaces and wood stoves.  Do not smoke and do not allow smoking in your home.   Avoid exposure to perfumes and fragrances.   Get rid of pests (such as roaches and mice) and their droppings.   Throw away plants if you see mold on them.   Keep your house clean and dust free.   Replace carpet with wood, tile, or vinyl flooring. Carpet can trap dander and dust.   Use allergy-proof pillows, mattress covers, and box spring covers.   Wash bed sheets and blankets every week in hot water and dry them in a dryer.   Use blankets that are made of polyester or cotton.   Wash hands frequently. SEEK MEDICAL CARE IF:   You have muscle aches.   You have chest pain.   The sputum changes from clear or white to yellow, green, gray, or bloody.   The sputum you cough up gets thicker.   There are problems that may be related to the medicine you are given, such as a rash, itching, swelling, or trouble breathing.  SEEK IMMEDIATE MEDICAL CARE IF:   You have worsening wheezing and coughing even after taking your prescribed medicines.   You have increased difficulty breathing.   You develop severe chest pain. MAKE SURE YOU:     Understand these instructions.  Will watch your condition.  Will get help right away if you are not doing well or get worse. Document Released: 11/22/2003 Document Revised: 11/24/2013 Document Reviewed: 05/11/2013 Exit[employer] Patient Information 2015 [employer]. This information is not intended to replace advice given to you by your health care provider. Make sure you discuss any questions you have with your health care provider.  

## 2015-02-02 NOTE — ED Notes (Signed)
Pt reports SOB and fever since last night.  Pt reports she has been sick since Sunday but became worse Sunday.  Pt reports non-productive cough as well.

## 2015-02-02 NOTE — ED Provider Notes (Signed)
Date: 02/02/2015  Rate: 111  Rhythm: sinus tachycardia  QRS Axis: normal  Intervals: normal  ST/T Wave abnormalities: normal  Conduction Disutrbances:none  Narrative Interpretation:   Old EKG Reviewed: none available    Elwin MochaBlair Brand Siever, MD 02/02/15 1705

## 2015-02-02 NOTE — Discharge Instructions (Signed)
Continue take the azithromycin, prednisone and use your inhaler as instructed.  Return to the emergency room for any worsening or concerning symptoms including fast breathing, heart racing, confusion, vomiting.  Rest, cover your mouth when you cough and wash your hands frequently.   Push fluids: water or Gatorade, do not drink any soda, juice or caffeinated beverages.  For fever and pain control you can take Motrin (ibuprofen) as follows: 400 mg (this is normally 2 over the counter pills) every 4 hours with food.  Do not return to work until a day after your fever breaks.   Take Vicodin for cough and pain control, do not drink alcohol, drive, care for children or do other critical tasks while taking Vicodin     Please follow with your primary care doctor in the next 2 days for a check-up. They must obtain records for further management.   Do not hesitate to return to the Emergency Department for any new, worsening or concerning symptoms.

## 2015-02-02 NOTE — ED Notes (Signed)
Patient has arrived on pod c from the lobby. Pt undressed and placed in hospital gown

## 2015-02-02 NOTE — ED Provider Notes (Signed)
CSN: [id number]     Arrival date & time 02/01/15  1949 History   First MD Initiated Contact with Patient 02/02/15 0000     Chief Complaint  Patient presents with  . Shortness of Breath     (Consider location/radiation/quality/duration/timing/severity/associated sxs/prior Treatment) HPI Patient presents with nonproductive cough, fever, fatigue for the past 3 days. No known sick contacts. Patient has developed chest tightness and difficulty breathing this evening. No history of asthma. Was given albuterol treatment in triage with complete resolution of her chest tightness and shortness of breath. She currently complains of no pain. Specifically denies nasal congestion, sore throat, abdominal pain, nausea, vomiting, diarrhea, urinary symptoms. Past Medical History  Diagnosis Date  . Hepatitis C antibody test positive     this is written in admission paperwork in 2012.  needs to be confirmed with blood tests.   . Chlamydia trachomatis infection of genitourinary sites 2009  . HSV-1 (herpes simplex virus 1) infection   . Depression   . PID (pelvic inflammatory disease)    Past Surgical History  Procedure Laterality Date  . Cesarean section  05/2011   Family History  Problem Relation Age of Onset  . Diabetes Sister   . Diabetes Maternal Aunt   . Hypertension Maternal Aunt   . Diabetes Maternal Uncle   . Hypertension Maternal Uncle    History  Substance Use Topics  . Smoking status: Current Some Day Smoker -- 1.00 packs/day    Types: Cigarettes    Last Attempt to Quit: 03/15/2014  . Smokeless tobacco: Never Used  . Alcohol Use: 1.8 oz/week    3 Glasses of wine per week   OB History    Gravida Para Term Preterm AB TAB SAB Ectopic Multiple Living   Review of Systems  Constitutional: Positive for fever and fatigue. Negative for chills.  HENT: Negative for congestion and sore throat.   Respiratory: Positive for cough, chest tightness and shortness of breath.    Cardiovascular: Negative for chest pain, palpitations and leg swelling.  Gastrointestinal: Negative for nausea, vomiting, abdominal pain and diarrhea.  Genitourinary: Negative for dysuria and flank pain.  Musculoskeletal: Negative for back pain, neck pain and neck stiffness.  Skin: Negative for rash and wound.  Neurological: Negative for dizziness, weakness, light-headedness, numbness and headaches.  All other systems reviewed and are negative.     Allergies  Pollen extract  Home Medications   Prior to Admission medications   Medication Sig Start Date End Date Taking? Authorizing Provider  Chlorphen-Pseudoephed-APAP (THERAFLU FLU/COLD PO) Take 2 capsules by mouth every 4 (four) hours as needed (flu like symptoms).   Yes Historical Provider, MD  dextromethorphan-guaiFENesin (MUCINEX DM) 30-600 MG per 12 hr tablet Take 1 tablet by mouth 2 (two) times daily as needed for cough.   Yes Historical Provider, MD  azithromycin (ZITHROMAX Z-PAK) 250 MG tablet 2 po day one, then 1 daily x 4 days 02/02/15   Loren Racer, MD  medroxyPROGESTERone (DEPO-PROVERA) 150 MG/ML injection Inject 1 mL (150 mg total) into the muscle every 3 (three) months. 03/25/14   Kirkland Hun, MD  predniSONE (DELTASONE) 20 MG tablet 3 tabs po day one, then 2 po daily x 4 days 02/02/15   Loren Racer, MD   BP 96/55 mmHg  Pulse 102  Temp(Src) 99.8 F (37.7 C) (Oral)  Resp 18  SpO2 100% Physical Exam  Constitutional: She is oriented to person, place,  and time. She appears well-developed and well-nourished. No distress.  Patient is comfortable talking on cellphone.  HENT:  Head: Normocephalic and atraumatic.  Mouth/Throat: Oropharynx is clear and moist.  Oropharynx mildly erythematous without exudate.  Eyes: EOM are normal. Pupils are equal, round, and reactive to light.  Neck: Normal range of motion. Neck supple.  No meningismus  Cardiovascular: Normal rate and regular rhythm.  Exam reveals no gallop.   No  murmur heard. Pulmonary/Chest: Effort normal and breath sounds normal. No respiratory distress. She has no wheezes. She has no rales. She exhibits no tenderness.  Abdominal: Soft. Bowel sounds are normal. She exhibits no distension and no mass. There is no tenderness. There is no rebound and no guarding.  Musculoskeletal: Normal range of motion. She exhibits no edema or tenderness.  No CVA tenderness bilaterally  Lymphadenopathy:    She has no cervical adenopathy.  Neurological: She is alert and oriented to person, place, and time.  Social extremities without deficit. Sensation is grossly intact.  Skin: Skin is warm and dry. No rash noted. No erythema.  Psychiatric: She has a normal mood and affect. Her behavior is normal.  Nursing note and vitals reviewed.   ED Course  Procedures (including critical care time) Labs Review Labs Reviewed - No data to display  Imaging Review Dg Chest 2 View  02/01/2015   CLINICAL DATA:  Fever and upper chest pain for 24 hours. Shortness of breath  EXAM: CHEST  2 VIEW  COMPARISON:  03/29/2008  FINDINGS: Normal heart size and mediastinal contours. No acute infiltrate or edema. No effusion or pneumothorax. Thoracic dextroscoliosis, gross least similar to 2009.  IMPRESSION: 1. No active cardiopulmonary disease. 2. Thoracic scoliosis.   Electronically Signed   By: Marnee SpringJonathon  Watts M.D.   On: 02/01/2015 22:49     EKG Interpretation None      MDM   Final diagnoses:  Acute bronchitis with bronchospasm     Patient's symptoms completely resolved after nebulizer treatment. Heart rate is improved. Patient clinically looks well. We'll discharge home with short course of prednisone and inhaler. Return precautions given.   Loren Raceravid Alayah Knouff, MD 02/02/15 [phone]

## 2015-02-02 NOTE — ED Provider Notes (Signed)
CSN: [id number]     Arrival date & time 02/02/15  1259 History   None    Chief Complaint  Patient presents with  . Shortness of Breath     (Consider location/radiation/quality/duration/timing/severity/associated sxs/prior Treatment) HPI  Heather Lane is a 24 y.o. female complaining of worsening dry cough, shortness of breath, right-sided pleuritic chest pain associated with fever and posttussive nausea, myalgia, headache, rhinorrhea. Patient was seen and evaluated yesterday, diagnosed with acute bronchitis and she's been using her inhaler at home every 4 hours with little relief. She's been taking over-the-counter cold medications with no relief. No underlying asthma, diabetes. Patient denies hemoptysis, history of DVT or PE. Has depot birth control shot. Patient was given a prescription for Z-Pak yesterday.  Past Medical History  Diagnosis Date  . Hepatitis C antibody test positive     this is written in admission paperwork in 2012.  needs to be confirmed with blood tests.   . Chlamydia trachomatis infection of genitourinary sites 2009  . HSV-1 (herpes simplex virus 1) infection   . Depression   . PID (pelvic inflammatory disease)    Past Surgical History  Procedure Laterality Date  . Cesarean section  05/2011   Family History  Problem Relation Age of Onset  . Diabetes Sister   . Diabetes Maternal Aunt   . Hypertension Maternal Aunt   . Diabetes Maternal Uncle   . Hypertension Maternal Uncle    History  Substance Use Topics  . Smoking status: Current Some Day Smoker -- 1.00 packs/day    Types: Cigarettes    Last Attempt to Quit: 03/15/2014  . Smokeless tobacco: Never Used  . Alcohol Use: 1.8 oz/week    3 Glasses of wine per week   OB History    Gravida Para Term Preterm AB TAB SAB Ectopic Multiple Living   Review of Systems  10 systems reviewed and found to be negative, except as noted in the HPI.   Allergies  Pollen extract  Home Medications    Prior to Admission medications   Medication Sig Start Date End Date Taking? Authorizing Provider  azithromycin (ZITHROMAX Z-PAK) 250 MG tablet 2 po day one, then 1 daily x 4 days 02/02/15   Loren Racer, MD  Chlorphen-Pseudoephed-APAP [facility] FLU/COLD PO) Take 2 capsules by mouth every 4 (four) hours as needed (flu like symptoms).    Historical Provider, MD  dextromethorphan-guaiFENesin (MUCINEX DM) 30-600 MG per 12 hr tablet Take 1 tablet by mouth 2 (two) times daily as needed for cough.    Historical Provider, MD  medroxyPROGESTERone (DEPO-PROVERA) 150 MG/ML injection Inject 1 mL (150 mg total) into the muscle every 3 (three) months. 03/25/14   Kirkland Hun, MD  predniSONE (DELTASONE) 20 MG tablet 3 tabs po day one, then 2 po daily x 4 days 02/02/15   Loren Racer, MD   BP 113/70 mmHg  Pulse 129  Temp(Src) 99 F (37.2 C) (Oral)  Resp 30  Ht  (1.651 m)  Wt 130 lb (58.968 kg)  BMI 21.63 kg/m2  SpO2 100% Physical Exam  Constitutional: She is oriented to person, place, and time. She appears well-developed and well-nourished. No distress.  HENT:  Head: Normocephalic.  Mouth/Throat: Oropharynx is clear and moist.  Eyes: Conjunctivae and EOM are normal. Pupils are equal, round, and reactive to light.  Neck: Normal range of motion.  Cardiovascular: Normal rate, regular rhythm and intact distal pulses.  Pulmonary/Chest: Effort normal and breath sounds normal. No stridor. No respiratory distress. She has no wheezes. She has no rales. She exhibits no tenderness.  Abdominal: Soft. Bowel sounds are normal. She exhibits no distension and no mass. There is no tenderness. There is no rebound and no guarding.  Musculoskeletal: Normal range of motion. She exhibits no edema or tenderness.  No calf asymmetry, superficial collaterals, palpable cords, edema, Homans sign negative bilaterally.    Neurological: She is alert and oriented to person, place, and time.  Psychiatric: She has a  normal mood and affect.  Nursing note and vitals reviewed.   ED Course  Procedures (including critical care time) Labs Review Labs Reviewed  COMPREHENSIVE METABOLIC PANEL - Abnormal; Notable for the following:    Glucose, Bld 144 (*)    Creatinine, Ser 1.13 (*)    GFR calc non Af Amer 68 (*)    GFR calc Af Amer 79 (*)    All other components within normal limits  CBC - Abnormal; Notable for the following:    WBC 19.8 (*)    MCHC 36.7 (*)    All other components within normal limits  URINALYSIS, ROUTINE W REFLEX MICROSCOPIC - Abnormal; Notable for the following:    APPearance CLOUDY (*)    Hgb urine dipstick TRACE (*)    Bilirubin Urine SMALL (*)    Ketones, ur 15 (*)    Protein, ur 100 (*)    All other components within normal limits  URINE MICROSCOPIC-ADD ON - Abnormal; Notable for the following:    Squamous Epithelial / LPF MANY (*)    Bacteria, UA MANY (*)    Casts HYALINE CASTS (*)    All other components within normal limits  I-STAT CG4 LACTIC ACID, ED - Abnormal; Notable for the following:    Lactic Acid, Venous 2.76 (*)    All other components within normal limits  I-STAT CG4 LACTIC ACID, ED - Abnormal; Notable for the following:    Lactic Acid, Venous 2.95 (*)    All other components within normal limits  CULTURE, BLOOD (ROUTINE X 2)  CULTURE, BLOOD (ROUTINE X 2)  URINE CULTURE  I-STAT BETA HCG BLOOD, ED ([facility], [facility], [facility] ONLY)  I-STAT CG4 LACTIC ACID, ED  I-STAT CG4 LACTIC ACID, ED  I-STAT CG4 LACTIC ACID, ED    Imaging Review Dg Chest 2 View  02/01/2015   CLINICAL DATA:  Fever and upper chest pain for 24 hours. Shortness of breath  EXAM: CHEST  2 VIEW  COMPARISON:  03/29/2008  FINDINGS: Normal heart size and mediastinal contours. No acute infiltrate or edema. No effusion or pneumothorax. Thoracic dextroscoliosis, gross least similar to 2009.  IMPRESSION: 1. No active cardiopulmonary disease. 2. Thoracic scoliosis.   Electronically Signed   By: Marnee SpringJonathon  Watts M.D.    On: 02/01/2015 22:49     EKG Interpretation None      MDM   Final diagnoses:  Influenza-like illness    Filed Vitals:   02/02/15 2000 02/02/15 2030 02/02/15 2125 02/02/15 2126  BP: 103/59 100/57 110/60   Pulse: 111 102    Temp:    97.9 F (36.6 C)  TempSrc:    Oral  Resp: 21 21 20    Height:      Weight:      SpO2: 97% 96% 100%     Medications  acetaminophen (TYLENOL) tablet 650 mg (650 mg Oral Given 02/02/15 1314)  sodium chloride 0.9 % bolus 2,000 mL (0 mLs Intravenous Stopped  02/02/15 1752)  dexamethasone (DECADRON) injection 10 mg (10 mg Intravenous Given 02/02/15 1552)  azithromycin (ZITHROMAX) tablet 500 mg (500 mg Oral Given 02/02/15 1551)  ipratropium-albuterol (DUONEB) 0.5-2.5 (3) MG/3ML nebulizer solution 3 mL (3 mLs Nebulization Given 02/02/15 1553)  cefTRIAXone (ROCEPHIN) 1 g in dextrose 5 % 50 mL IVPB (0 g Intravenous Stopped 02/02/15 1642)  sodium chloride 0.9 % bolus 2,000 mL (0 mLs Intravenous Stopped 02/02/15 1857)  morphine 4 MG/ML injection 4 mg (4 mg Intravenous Given 02/02/15 1658)  ondansetron (ZOFRAN) injection 4 mg (4 mg Intravenous Given 02/02/15 1658)  chlorpheniramine-HYDROcodone (TUSSIONEX) 10-8 MG/5ML suspension 5 mL (5 mLs Oral Given 02/02/15 1752)  acetaminophen (TYLENOL) tablet 325 mg (325 mg Oral Given 02/02/15 1806)  ipratropium-albuterol (DUONEB) 0.5-2.5 (3) MG/3ML nebulizer solution 3 mL (3 mLs Nebulization Given 02/02/15 1806)  oseltamivir (TAMIFLU) capsule 75 mg (75 mg Oral Given 02/02/15 1819)    Heather Lane is a pleasant 24 y.o. female presenting with cough, rhinorrhea, myalgia onset several days ago. Patient is a Joni Reining initially febrile and tachycardic, she is saturating well on room air and lung sounds are clear to auscultation bilaterally. She had a negative chest x-ray yesterday. Blood cultures are drawn, patient is bolused, azithromycin and Rocephin given to treat for a presumed community-acquired pneumonia. Her lactic acid is initially elevated at  2.9. Patient is aggressively hydrated, given multiple nebulizer treatments and she reports improvement. I discussed with her that the likelihood of this is an influenza-like illness I've advised her of the pros and cons of taking Tamiflu and patient has opted to start Tamiflu. I have advised patient to continue to take Z-Pak, aggressively maintain her hydration and we've had an extensive discussion of return precautions to which patient verbalizes her understanding.  Discussed case with attending MD who agrees with plan and stability to d/c to home.    Evaluation does not show pathology that would require ongoing emergent intervention or inpatient treatment. Pt is hemodynamically stable and mentating appropriately. Discussed findings and plan with patient/guardian, who agrees with care plan. All questions answered. Return precautions discussed and outpatient follow up given.   Discharge Medication List as of 02/02/2015  8:49 PM    START taking these medications   Details  HYDROcodone-acetaminophen (NORCO/VICODIN) 5-325 MG per tablet Take 1-2 tablets by mouth every 6 hours as needed for pain., Print    ondansetron (ZOFRAN) 4 MG tablet Take 1 tablet (4 mg total) by mouth every 8 (eight) hours as needed for nausea or vomiting., Starting 02/02/2015, Until Discontinued, Print    oseltamivir (TAMIFLU) 75 MG capsule Take 1 capsule (75 mg total) by mouth every 12 (twelve) hours., Starting 02/02/2015, Until Discontinued, Print             Wynetta Emery, PA-C 02/03/15 0017  Wynetta Emery, PA-C 02/03/15 1610  Geoffery Lyons, MD 02/03/15 [phone]

## 2015-02-02 NOTE — ED Notes (Signed)
Joni ReiningNicole, [place] notified of abnormal lab test results

## 2015-02-02 NOTE — ED Notes (Signed)
Pt in c/o cough and congestion, seen earlier this morning at [facility] and dx with bronchitis, pt states she has been using her inhaler but it isn't helping, pt hyperventilating at times in triage but will stop on her own, pt tearful, pt has not had medication for fever since around 1am

## 2015-02-04 LAB — URINE CULTURE
CULTURE: NO GROWTH
Colony Count: NO GROWTH

## 2015-02-08 LAB — CULTURE, BLOOD (ROUTINE X 2)
Culture: NO GROWTH
Culture: NO GROWTH

## 2015-02-16 ENCOUNTER — Other Ambulatory Visit (HOSPITAL_COMMUNITY)
Admission: RE | Admit: 2015-02-16 | Discharge: 2015-02-16 | Disposition: A | Discharge status: Home / Self Care | Payer: Medicaid Other | Source: Ambulatory Visit | Attending: Family Medicine | Admitting: Family Medicine

## 2015-02-16 ENCOUNTER — Other Ambulatory Visit: Payer: Medicaid Other | Admitting: Family Medicine

## 2015-02-16 DIAGNOSIS — Z113 Encounter for screening for infections with a predominantly sexual mode of transmission: Secondary | ICD-10-CM | POA: Diagnosis not present

## 2015-02-16 DIAGNOSIS — N760 Acute vaginitis: Secondary | ICD-10-CM | POA: Insufficient documentation

## 2015-02-18 LAB — URINE CYTOLOGY ANCILLARY ONLY
BACTERIAL VAGINITIS: POSITIVE — AB
BACTERIAL VAGINITIS: POSITIVE — AB
Bacterial vaginitis: POSITIVE — AB
Bacterial vaginitis: POSITIVE — AB
Bacterial vaginitis: POSITIVE — AB
CANDIDA VAGINITIS: NEGATIVE
CHLAMYDIA, DNA PROBE: NEGATIVE
Neisseria Gonorrhea: NEGATIVE
TRICH (WINDOWPATH): NEGATIVE

## 2015-04-15 ENCOUNTER — Emergency Department (HOSPITAL_COMMUNITY)
Admission: EM | Admit: 2015-04-15 | Discharge: 2015-04-15 | Discharge status: Absent without leave | Payer: Medicaid Other | Source: Home / Self Care

## 2015-04-15 NOTE — ED Notes (Signed)
Room call #2 at 1322.

## 2015-04-15 NOTE — ED Notes (Signed)
Called x3; NA... Pt has been d/c w/o being seen,.

## 2015-04-15 NOTE — ED Notes (Signed)
Called x1; NA 

## 2015-08-07 ENCOUNTER — Encounter (HOSPITAL_COMMUNITY): Payer: Self-pay | Admitting: Emergency Medicine

## 2015-08-07 ENCOUNTER — Emergency Department (HOSPITAL_COMMUNITY)
Admission: EM | Admit: 2015-08-07 | Discharge: 2015-08-07 | Disposition: A | Discharge status: Home / Self Care | Payer: Medicaid Other | Attending: Emergency Medicine | Admitting: Emergency Medicine

## 2015-08-07 ENCOUNTER — Emergency Department (HOSPITAL_COMMUNITY): Payer: Medicaid Other

## 2015-08-07 DIAGNOSIS — Z79899 Other long term (current) drug therapy: Secondary | ICD-10-CM | POA: Insufficient documentation

## 2015-08-07 DIAGNOSIS — R1011 Right upper quadrant pain: Secondary | ICD-10-CM | POA: Diagnosis not present

## 2015-08-07 DIAGNOSIS — Z3202 Encounter for pregnancy test, result negative: Secondary | ICD-10-CM | POA: Diagnosis not present

## 2015-08-07 DIAGNOSIS — Z8742 Personal history of other diseases of the female genital tract: Secondary | ICD-10-CM | POA: Diagnosis not present

## 2015-08-07 DIAGNOSIS — R109 Unspecified abdominal pain: Secondary | ICD-10-CM

## 2015-08-07 DIAGNOSIS — Z720 Tobacco use: Secondary | ICD-10-CM | POA: Diagnosis not present

## 2015-08-07 DIAGNOSIS — Z8619 Personal history of other infectious and parasitic diseases: Secondary | ICD-10-CM | POA: Diagnosis not present

## 2015-08-07 DIAGNOSIS — Z8659 Personal history of other mental and behavioral disorders: Secondary | ICD-10-CM | POA: Insufficient documentation

## 2015-08-07 LAB — I-STAT BETA HCG BLOOD, ED (MC, WL, AP ONLY): I-stat hCG, quantitative: <5 m[IU]/mL (ref <5)

## 2015-08-07 LAB — COMPREHENSIVE METABOLIC PANEL
ALT: 14 U/L (ref 14–54)
ANION GAP: 6 (ref 5–15)
AST: 20 U/L (ref 15–41)
Albumin: 4.1 g/dL (ref 3.5–5.0)
Alkaline Phosphatase: 44 U/L (ref 38–126)
BUN: 10 mg/dL (ref 6–20)
CHLORIDE: 106 mmol/L (ref 101–111)
CO2: 27 mmol/L (ref 22–32)
Calcium: 9.1 mg/dL (ref 8.9–10.3)
Creatinine, Ser: 0.9 mg/dL (ref 0.44–1.00)
Glucose, Bld: 123 mg/dL — ABNORMAL HIGH (ref 65–99)
Potassium: 3.4 mmol/L — ABNORMAL LOW (ref 3.5–5.1)
SODIUM: 139 mmol/L (ref 135–145)
Total Bilirubin: 0.5 mg/dL (ref 0.3–1.2)
Total Protein: 6.8 g/dL (ref 6.5–8.1)

## 2015-08-07 LAB — CBC
HCT: 39.2 % (ref 36.0–46.0)
HEMOGLOBIN: 14.1 g/dL (ref 12.0–15.0)
MCH: 31.2 pg (ref 26.0–34.0)
MCHC: 36 g/dL (ref 30.0–36.0)
MCV: 86.7 fL (ref 78.0–100.0)
Platelets: 231 10*3/uL (ref 150–400)
RBC: 4.52 MIL/uL (ref 3.87–5.11)
RDW: 14 % (ref 11.5–15.5)
WBC: 10.7 10*3/uL — AB (ref 4.0–10.5)

## 2015-08-07 LAB — URINALYSIS, ROUTINE W REFLEX MICROSCOPIC
Bilirubin Urine: NEGATIVE
Glucose, UA: NEGATIVE mg/dL
Hgb urine dipstick: NEGATIVE
KETONES UR: NEGATIVE mg/dL
LEUKOCYTES UA: NEGATIVE
Nitrite: NEGATIVE
PH: 7 (ref 5.0–8.0)
Protein, ur: NEGATIVE mg/dL
Specific Gravity, Urine: 1.016 (ref 1.005–1.030)
Urobilinogen, UA: 1 mg/dL (ref 0.0–1.0)

## 2015-08-07 LAB — LIPASE, BLOOD: LIPASE: 19 U/L — AB (ref 22–51)

## 2015-08-07 MED ORDER — ONDANSETRON HCL 4 MG/2ML IJ SOLN: 4.0000 mg | Freq: Once | INTRAMUSCULAR | Status: DC

## 2015-08-07 MED ORDER — IOHEXOL 300 MG/ML  SOLN
50.0000 mL | Freq: Once | INTRAMUSCULAR | Status: AC | PRN
  Administered 2015-08-07: 50 mL via ORAL

## 2015-08-07 MED ORDER — IOHEXOL 300 MG/ML  SOLN
100.0000 mL | Freq: Once | INTRAMUSCULAR | Status: AC | PRN
  Administered 2015-08-07: 100 mL via INTRAVENOUS

## 2015-08-07 MED ORDER — MORPHINE SULFATE (PF) 4 MG/ML IV SOLN
4.0000 mg | Freq: Once | INTRAVENOUS | Status: AC
Start: 2015-08-07 — End: 2015-08-07
  Administered 2015-08-07: 4 mg via INTRAVENOUS
  Filled 2015-08-07: qty 1

## 2015-08-07 MED ORDER — ONDANSETRON HCL 4 MG/2ML IJ SOLN
4.0000 mg | Freq: Once | INTRAMUSCULAR | Status: AC | PRN
  Administered 2015-08-07: 4 mg via INTRAVENOUS
  Filled 2015-08-07: qty 2

## 2015-08-07 NOTE — ED Notes (Signed)
Brought in by EMS from home with c/o RLQ abdominal pain, onset 2 hours ago.  Pt has some nausea, no vomiting or diarrhea.

## 2015-08-07 NOTE — Discharge Instructions (Signed)
Return to the ED with any concerns including vomiting and not able to keep down liquids, increased abdominal pain, decreased level of alertness/lethargy, or any other alarming symptoms

## 2015-08-07 NOTE — ED Provider Notes (Signed)
CSN: [id number]     Arrival date & time 08/07/15  0300 History  This chart was scribed for Jerelyn Scott, MD by Lyndel Safe, ED Scribe. This patient was seen in room WA12/WA12 and the patient's care was started 3:58 AM.   Chief Complaint  Patient presents with  . Abdominal Pain   Patient is a 24 y.o. female presenting with abdominal pain. The history is provided by the patient. No language interpreter was used.  Abdominal Pain Pain location:  RUQ Pain quality: sharp   Pain radiates to:  Does not radiate Pain severity:  Moderate Timing:  Intermittent Progression:  Worsening Relieved by:  None tried Worsened by:  Deep breathing and eating Ineffective treatments:  None tried Associated symptoms: no dysuria, no fever, no vaginal discharge and no vomiting    HPI Comments: Heather Lane is a 24 y.o. female brought in by ambulance, who presents to the Emergency Department complaining of  intermittent, sharp, RUQ abdominal pain that has been present for 4 months but pt reports worsening of pain onset 2 hours PTA. Pt notes her sharp pain is exacerbated with deep breathing. Her pain is worse after eating. Denies fevers, vomiting, dysuria, urgency, or frequency, vaginal discharge, or back pain.    Past Medical History  Diagnosis Date  . Hepatitis C antibody test positive     this is written in admission paperwork in 2012.  needs to be confirmed with blood tests.   . Chlamydia trachomatis infection of genitourinary sites 2009  . HSV-1 (herpes simplex virus 1) infection   . Depression   . PID (pelvic inflammatory disease)    Past Surgical History  Procedure Laterality Date  . Cesarean section  05/2011   Family History  Problem Relation Age of Onset  . Diabetes Sister   . Diabetes Maternal Aunt   . Hypertension Maternal Aunt   . Diabetes Maternal Uncle   . Hypertension Maternal Uncle    Social History  Substance Use Topics  . Smoking status: Current Some Day Smoker -- 1.00 packs/day     Types: Cigarettes    Last Attempt to Quit: 03/15/2014  . Smokeless tobacco: Never Used  . Alcohol Use: 1.8 oz/week    3 Glasses of wine per week   OB History    Gravida Para Term Preterm AB TAB SAB Ectopic Multiple Living   1 1        1      Review of Systems  Constitutional: Negative for fever.  Gastrointestinal: Positive for abdominal pain. Negative for vomiting.  Genitourinary: Negative for dysuria, urgency, frequency and vaginal discharge.  Musculoskeletal: Negative for back pain.  All other systems reviewed and are negative.  Allergies  Pollen extract  Home Medications   Prior to Admission medications   Medication Sig Start Date End Date Taking? Authorizing Provider  acetaminophen (TYLENOL) 325 MG tablet Take 650 mg by mouth every 6 (six) hours as needed for mild pain.    Historical Provider, MD  albuterol (PROVENTIL HFA;VENTOLIN HFA) 108 (90 BASE) MCG/ACT inhaler Inhale 2 puffs into the lungs every 4 (four) hours as needed for wheezing or shortness of breath.    Historical Provider, MD  albuterol (PROVENTIL) (2.5 MG/3ML) 0.083% nebulizer solution Take 5 mg by nebulization every 6 (six) hours as needed for wheezing or shortness of breath.    Historical Provider, MD  azithromycin (ZITHROMAX Z-PAK) 250 MG tablet 2 po day one, then 1 daily x 4 days Patient not taking: Reported on 08/07/2015 02/02/15  Loren Racer, MD  Chlorphen-Pseudoephed-APAP [facility] FLU/COLD PO) Take 2 capsules by mouth every 4 (four) hours as needed (flu like symptoms).    Historical Provider, MD  dextromethorphan-guaiFENesin (MUCINEX DM) 30-600 MG per 12 hr tablet Take 1 tablet by mouth 2 (two) times daily as needed for cough.    Historical Provider, MD  HYDROcodone-acetaminophen (NORCO/VICODIN) 5-325 MG per tablet Take 1-2 tablets by mouth every 6 hours as needed for pain. Patient not taking: Reported on 08/07/2015 02/02/15   Joni Reining Pisciotta, PA-C  ibuprofen (ADVIL,MOTRIN) 600 MG tablet Take 600 mg by  mouth every 6 (six) hours as needed for fever or moderate pain.    Historical Provider, MD  ipratropium-albuterol (DUONEB) 0.5-2.5 (3) MG/3ML SOLN Take 3 mLs by nebulization every 4 (four) hours as needed (wheezing).    Historical Provider, MD  medroxyPROGESTERone (DEPO-PROVERA) 150 MG/ML injection Inject 1 mL (150 mg total) into the muscle every 3 (three) months. Patient not taking: Reported on 08/07/2015 03/25/14   Kirkland Hun, MD  ondansetron (ZOFRAN) 4 MG tablet Take 1 tablet (4 mg total) by mouth every 8 (eight) hours as needed for nausea or vomiting. Patient not taking: Reported on 08/07/2015 02/02/15   Joni Reining Pisciotta, PA-C  oseltamivir (TAMIFLU) 75 MG capsule Take 1 capsule (75 mg total) by mouth every 12 (twelve) hours. Patient not taking: Reported on 08/07/2015 02/02/15   Joni Reining Pisciotta, PA-C  predniSONE (DELTASONE) 20 MG tablet 3 tabs po day one, then 2 po daily x 4 days Patient not taking: Reported on 08/07/2015 02/02/15   Loren Racer, MD   BP 123/84 mmHg  Pulse 95  Temp(Src) 98.7 F (37.1 C) (Oral)  Resp 18  SpO2 100% Vitals reviewed Physical Exam  Physical Examination: General appearance - alert, well appearing, and in no distress Mental status - alert, oriented to person, place, and time Eyes - no conjunctival injection, no scleral icterus Mouth - mucous membranes moist, pharynx normal without lesions Chest - clear to auscultation, no wheezes, rales or rhonchi, symmetric air entry Heart - normal rate, regular rhythm, normal S1, S2, no murmurs, rubs, clicks or gallops Abdomen - soft, mild tenderness to palpation in right upper abdomen, right mid abdomen, no gaurding or rebound tenderness, nabs, nondistended, no masses or organomegaly Neurological - alert, oriented, normal speech, no focal findings or movement disorder noted Extremities - peripheral pulses normal, no pedal edema, no clubbing or cyanosis Skin - normal coloration and turgor, no rashes, no suspicious skin lesions  noted  ED Course  Procedures  DIAGNOSTIC STUDIES: Oxygen Saturation is 100% on RA, normal by my interpretation.    COORDINATION OF CARE: 4:01 AM Discussed treatment plan with pt. Pt acknowledges and agrees to plan.  Labs Review Labs Reviewed  LIPASE, BLOOD - Abnormal; Notable for the following:    Lipase 19 (*)    All other components within normal limits  COMPREHENSIVE METABOLIC PANEL - Abnormal; Notable for the following:    Potassium 3.4 (*)    Glucose, Bld 123 (*)    All other components within normal limits  CBC - Abnormal; Notable for the following:    WBC 10.7 (*)    All other components within normal limits  URINALYSIS, ROUTINE W REFLEX MICROSCOPIC (NOT AT [facility])  I-STAT BETA HCG BLOOD, ED ([facility], [facility], [facility] ONLY)    Imaging Review No results found. I have personally reviewed and evaluated these images and lab results as part of my medical decision-making.   EKG Interpretation None  MDM   Final diagnoses:  Abdominal pain, unspecified abdominal location    Pt presenting with c/o right sided abdominal pain- upper and periumbilicial pain which has been present over the past 4 months.  Worsening over the past couple of days.  Labs reassuring.  Abdominal ultasound without acute findings.  Per chart review pt has hx of mesenteric adenitis and also pelvic abscess- she has no pelvic or lower abdominal tenderness on exam tonight.  CT scan obtained and this shows no acute findings.  Advised f/u with PMD.  Discharged with strict return precautions.  Pt agreeable with plan.  I personally performed the services described in this documentation, which was scribed in my presence. The recorded information has been reviewed and is accurate.    Jerelyn Scott, MD 08/10/15 [phone]

## 2015-08-07 NOTE — ED Notes (Signed)
Bed: WA12 Expected date:  Expected time:  Means of arrival:  Comments: EMS 

## 2015-10-07 ENCOUNTER — Other Ambulatory Visit: Payer: Self-pay | Admitting: Family Medicine

## 2015-10-07 ENCOUNTER — Other Ambulatory Visit (HOSPITAL_COMMUNITY)
Admission: RE | Admit: 2015-10-07 | Discharge: 2015-10-07 | Disposition: A | Discharge status: Home / Self Care | Payer: Medicaid Other | Source: Ambulatory Visit | Attending: Family Medicine | Admitting: Family Medicine

## 2015-10-07 DIAGNOSIS — Z113 Encounter for screening for infections with a predominantly sexual mode of transmission: Secondary | ICD-10-CM | POA: Insufficient documentation

## 2015-10-07 DIAGNOSIS — Z01419 Encounter for gynecological examination (general) (routine) without abnormal findings: Secondary | ICD-10-CM | POA: Diagnosis not present

## 2015-10-07 DIAGNOSIS — N760 Acute vaginitis: Secondary | ICD-10-CM | POA: Diagnosis present

## 2015-10-10 LAB — CYTOLOGY - PAP

## 2016-03-07 IMAGING — CR DG CHEST 2V
2 series · 2 of 2 positions shown · non-contrast
Comparison: 03/29/2008

CLINICAL DATA: Fever and upper chest pain for 24 hours. Shortness
of breath

EXAM:
CHEST  2 VIEW

[w chest pa]
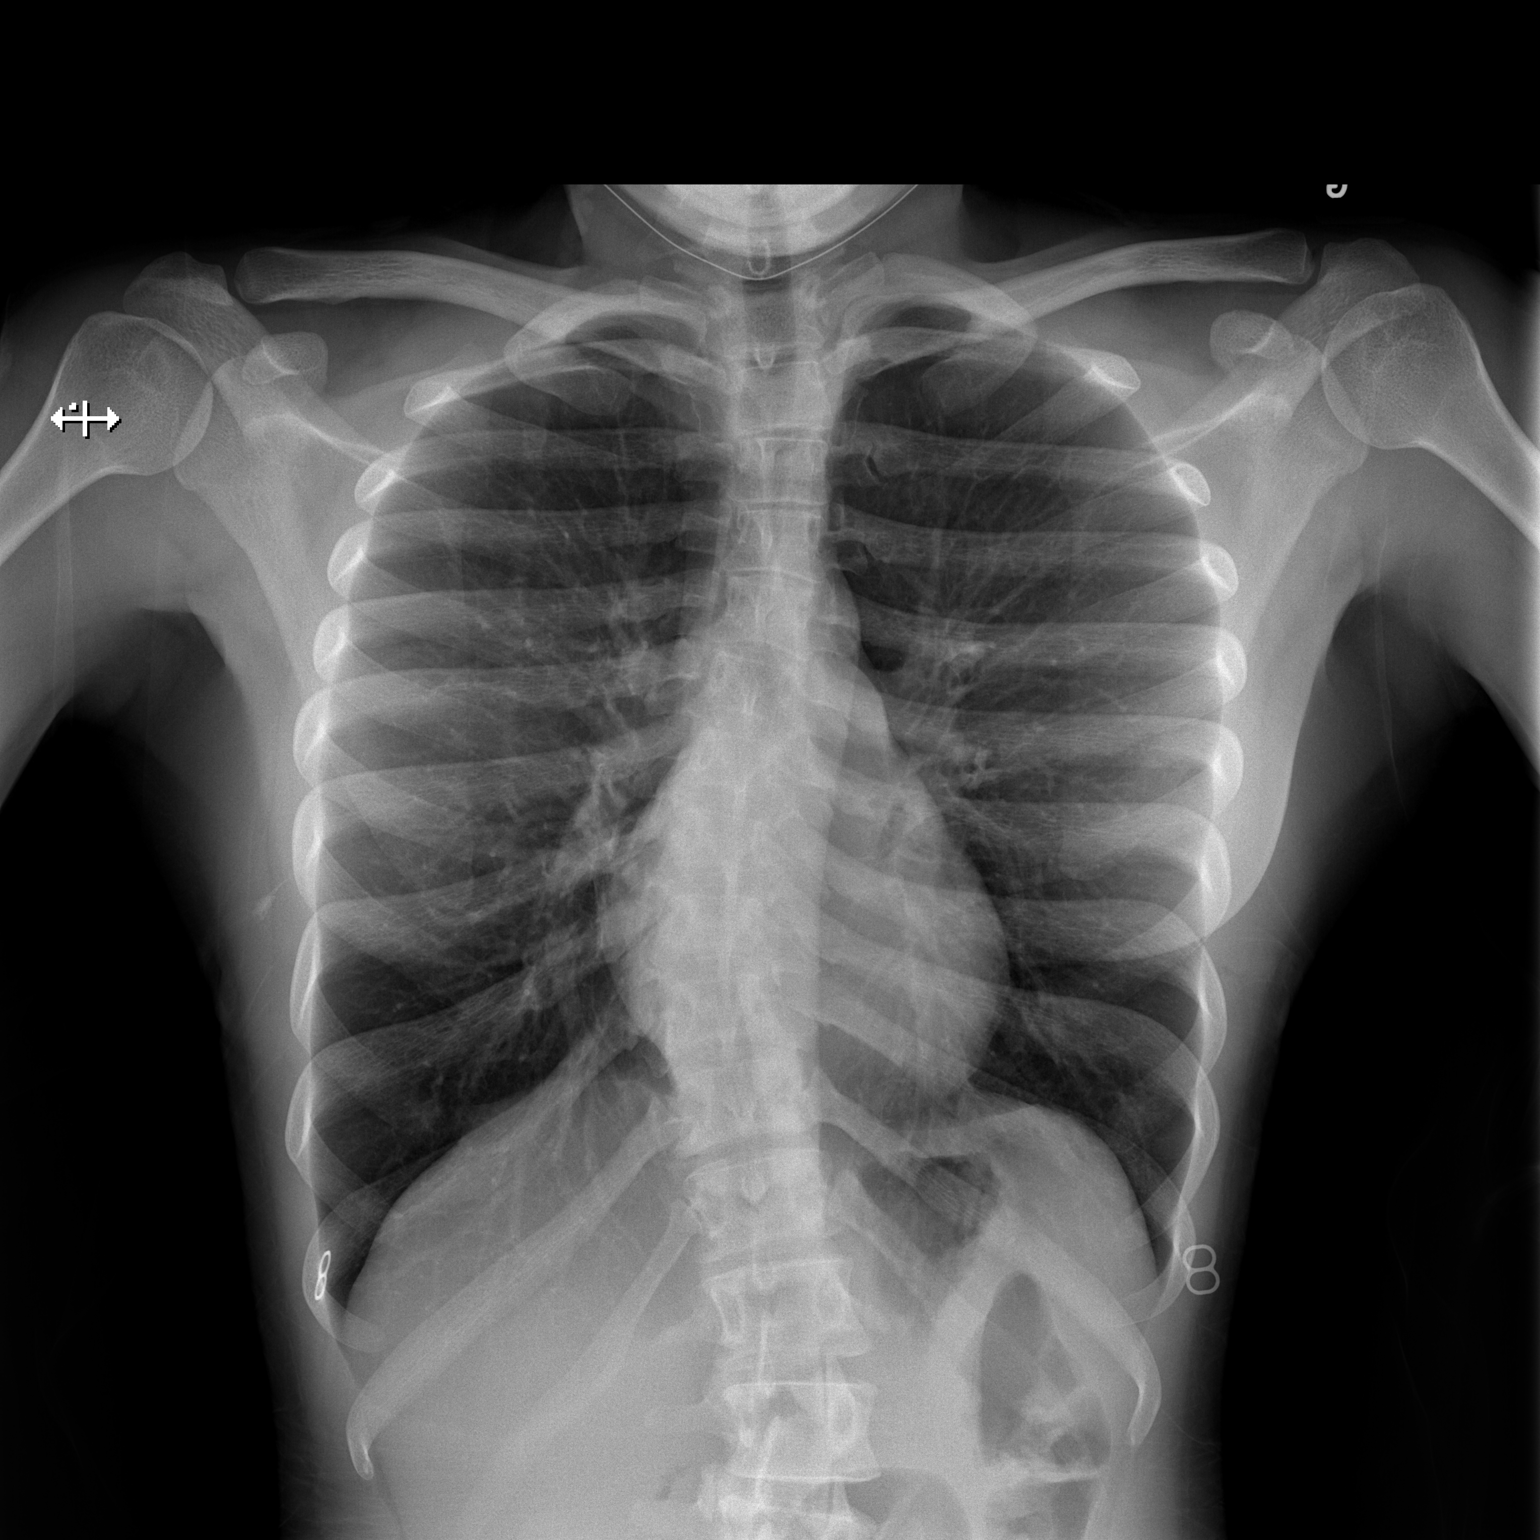

[w chest lat]
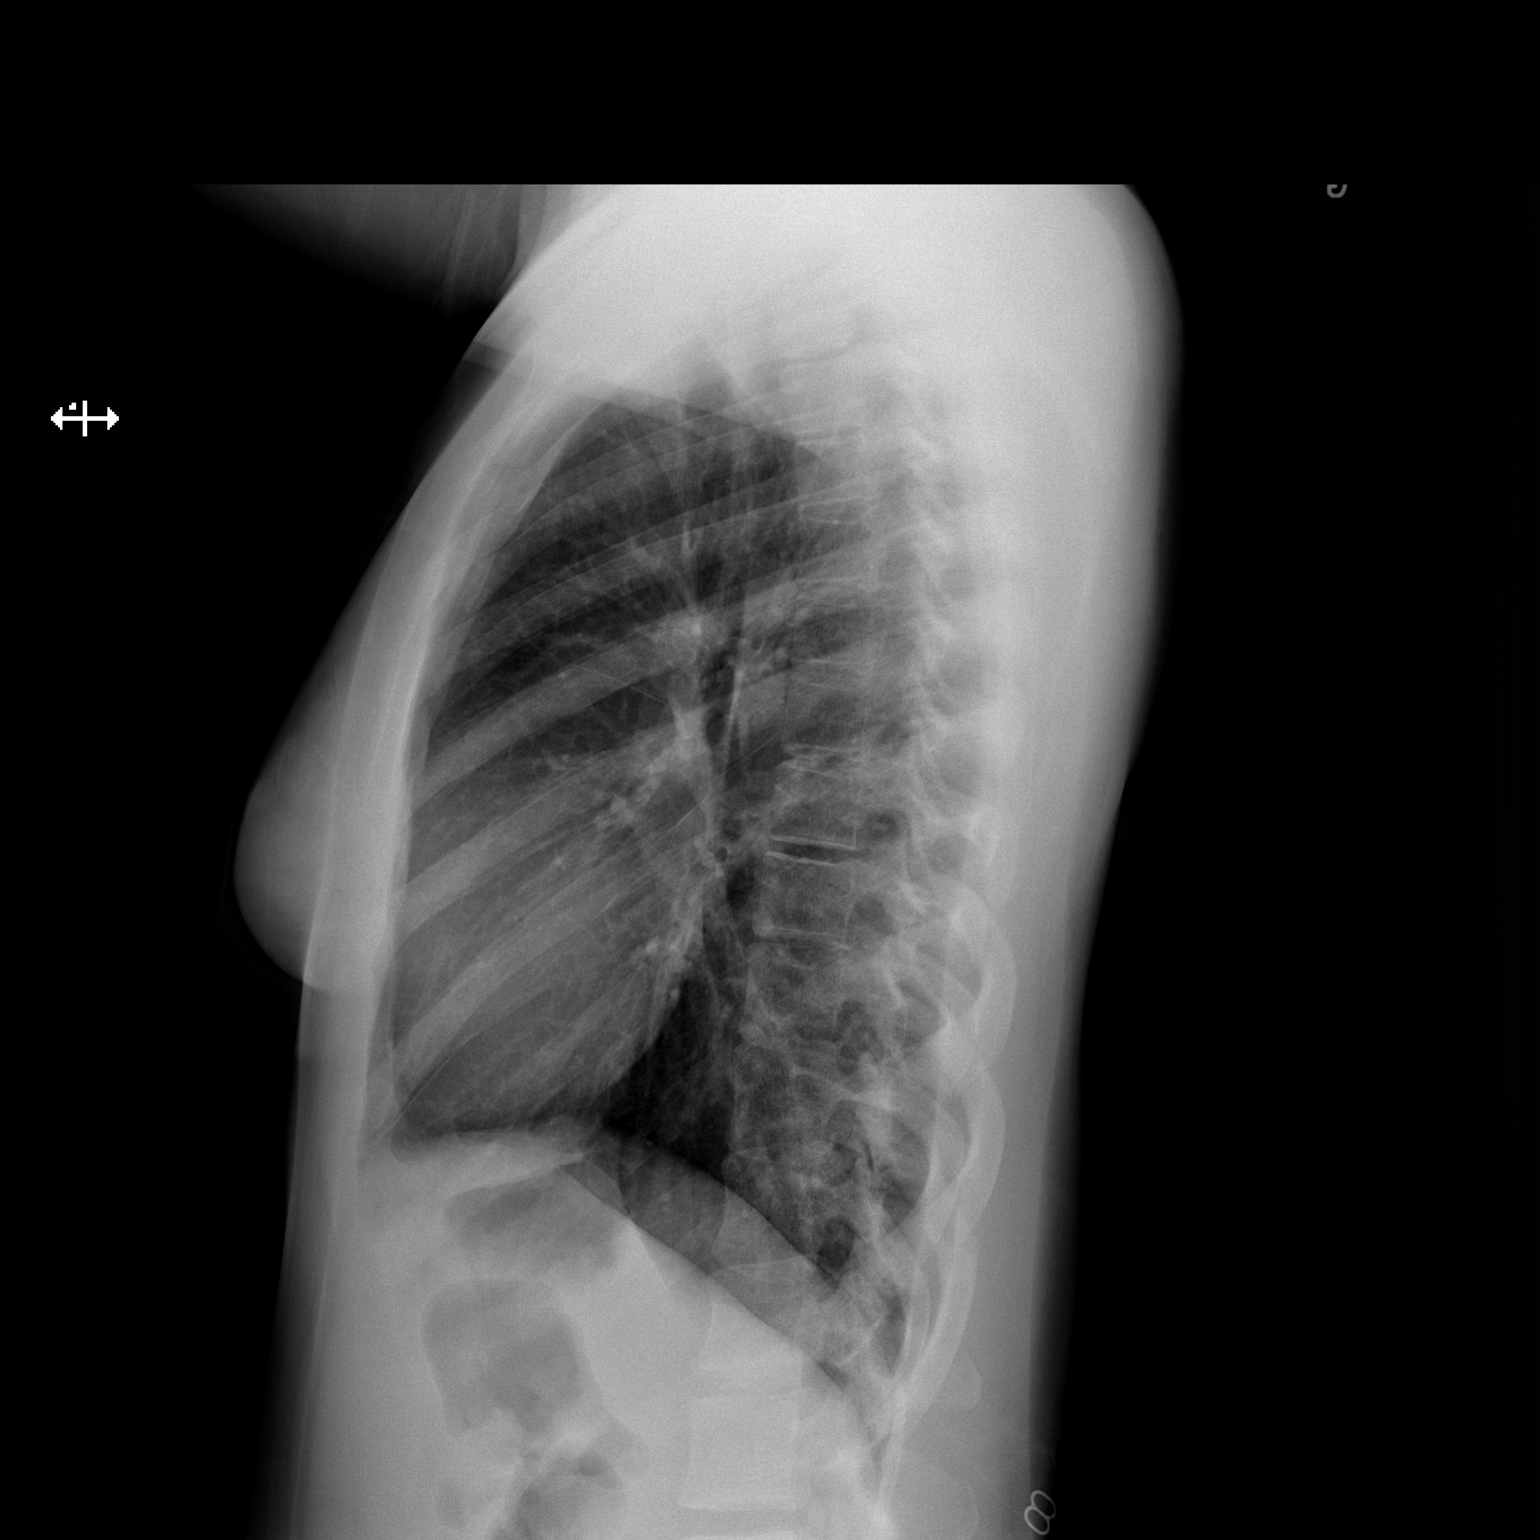

[2 of 2 positions shown; findings below may reference images not displayed]

FINDINGS: Normal heart size and mediastinal contours. No acute infiltrate or
edema. No effusion or pneumothorax. Thoracic dextroscoliosis, gross
least similar to 1667.
IMPRESSION: 1. No active cardiopulmonary disease.
2. Thoracic scoliosis.

## 2016-06-11 ENCOUNTER — Ambulatory Visit (HOSPITAL_COMMUNITY)
Admission: EM | Admit: 2016-06-11 | Discharge: 2016-06-11 | Disposition: A | Discharge status: Home / Self Care | Payer: Medicaid Other | Attending: Emergency Medicine | Admitting: Emergency Medicine

## 2016-06-11 ENCOUNTER — Encounter (HOSPITAL_COMMUNITY): Payer: Self-pay | Admitting: Emergency Medicine

## 2016-06-11 DIAGNOSIS — F419 Anxiety disorder, unspecified: Secondary | ICD-10-CM | POA: Diagnosis not present

## 2016-06-11 MED ORDER — DIAZEPAM 2 MG PO TABS: 2.0000 mg | ORAL_TABLET | Freq: Four times a day (QID) | ORAL | Status: DC | PRN

## 2016-06-11 MED ORDER — HYDROXYZINE HCL 25 MG PO TABS: 25.0000 mg | ORAL_TABLET | Freq: Four times a day (QID) | ORAL | Status: DC | PRN
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
## 2016-06-11 NOTE — Discharge Instructions (Signed)
If you need help with getting financial assistance, assistance in getting medications or finding a primary care physican, contact Maggy Mena here at the Urgent Care [facility]. Her phone number is [phone].  ° °[facility] °[phone] °[address] °[place] [address] ° °[facility]: [address] °[place] [address]  °[phone] ° °[facility]: [address] °[place] [address]   °[phone] ° °[facility]: [address] °[place] [address]  °[phone] ° °[facility] : [address] [place] [address] °[phone] ° °[facility]: [address] °[place] [address] °[phone] ° °[facility]: [address] °[place] [address] °[phone] ° °Dr. Mahima Pandey [address] [facility] [place] [address]  °[phone] ° °Dr. George Osei-Bonsu, [facility]. [address] ,  [address]  °[phone] ° °

## 2016-09-10 IMAGING — CT CT ABD-PELV W/ CM
2 of 4 series · 16 of 46 positions shown, 18 images · IV contrast (OMNIPAQUE 300)
Comparison: 04/23/2014

CLINICAL DATA: Sharp right upper quadrant abdominal pain, present
for 4 months but worsened over the past 2 hours.

EXAM:
CT ABDOMEN AND PELVIS WITH CONTRAST
TECHNIQUE: Multidetector CT imaging of the abdomen and pelvis was performed
using the standard protocol following bolus administration of
intravenous contrast.
CONTRAST:  50mL OMNIPAQUE IOHEXOL 300 MG/ML SOLN, 100mL OMNIPAQUE
IOHEXOL 300 MG/ML SOLN

[Series 2: abd/pel with · axial · 0.69mm/px · z∈[-471,-126]mm · 13 of 77 slices shown, 15 images]
[im 4/77  soft-tissue]
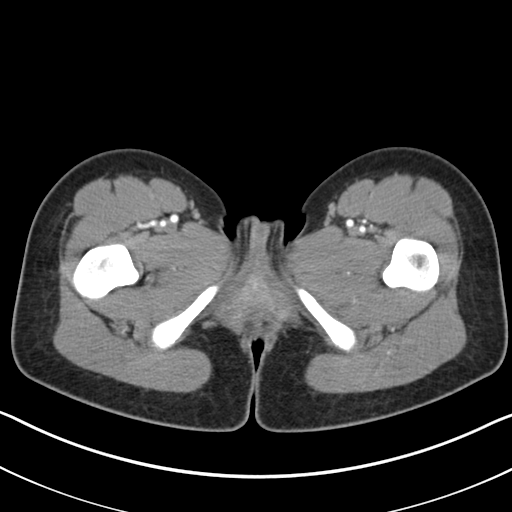
[im 4/77  bone]
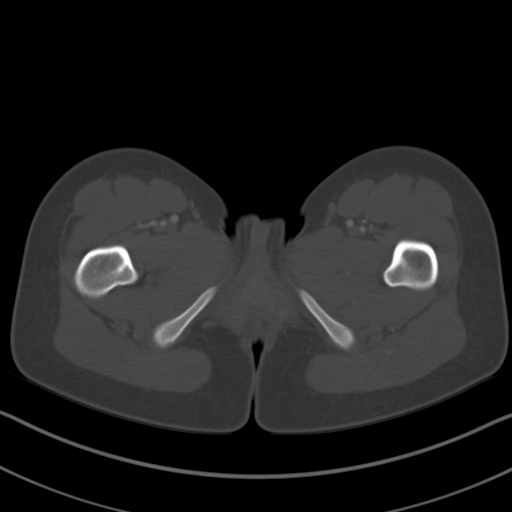
[im 10/77  soft-tissue]
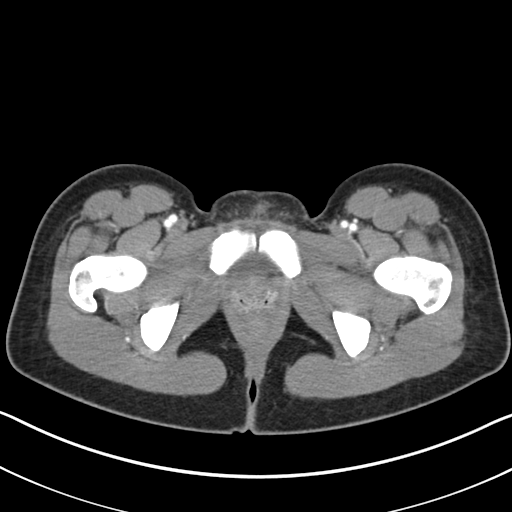
[im 16/77  soft-tissue]
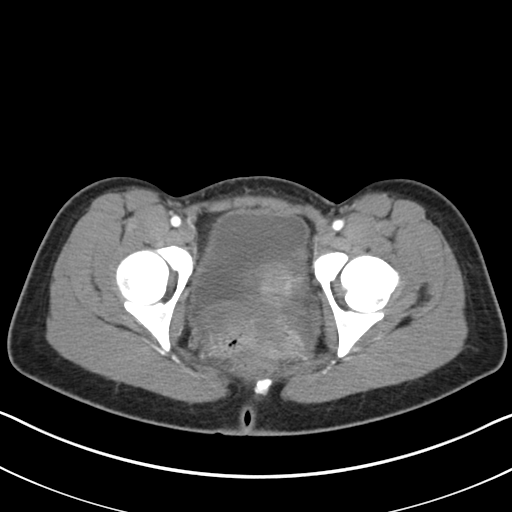
[im 23/77  soft-tissue]
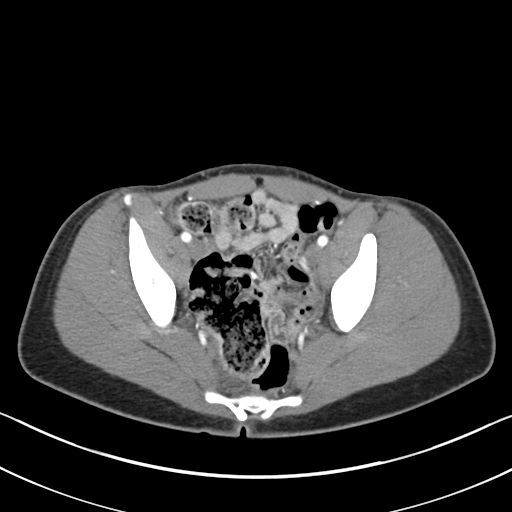
[im 26/77  soft-tissue]
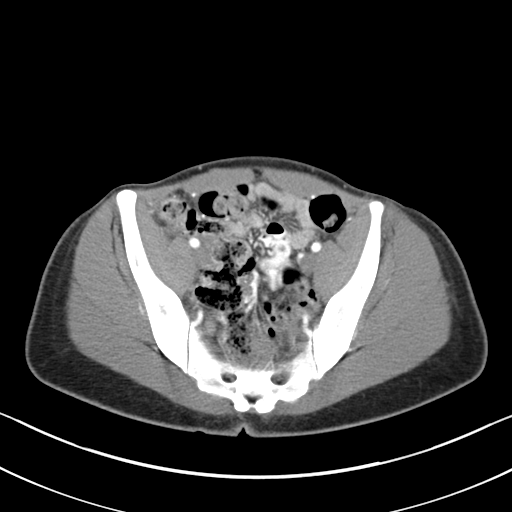
[im 32/77  soft-tissue]
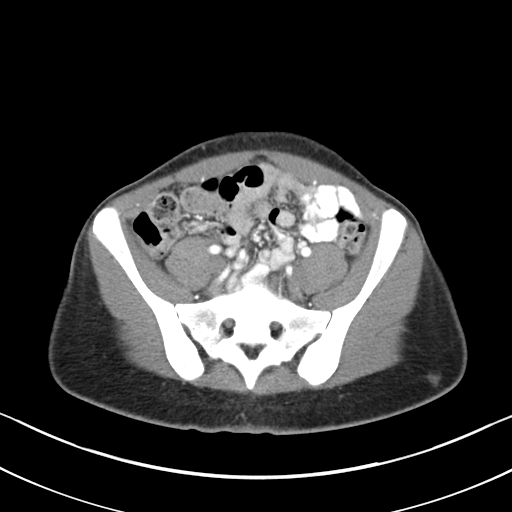
[im 39/77  soft-tissue]
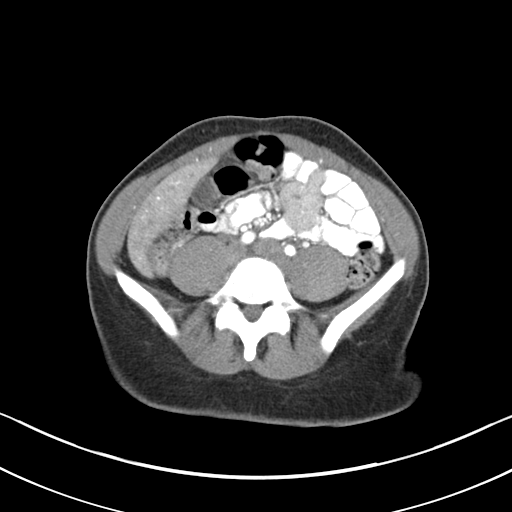
[im 45/77  soft-tissue]
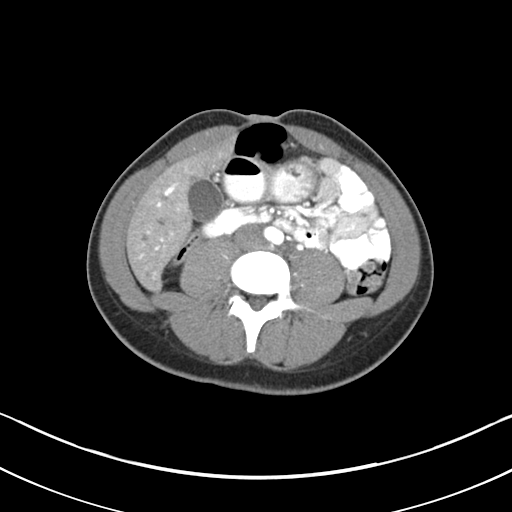
[im 51/77  soft-tissue]
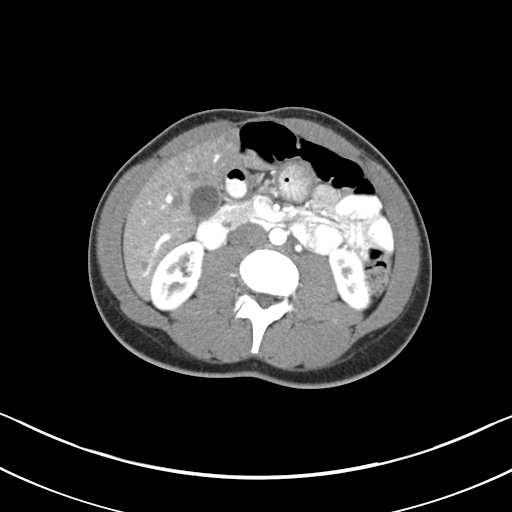
[im 51/77  bone]
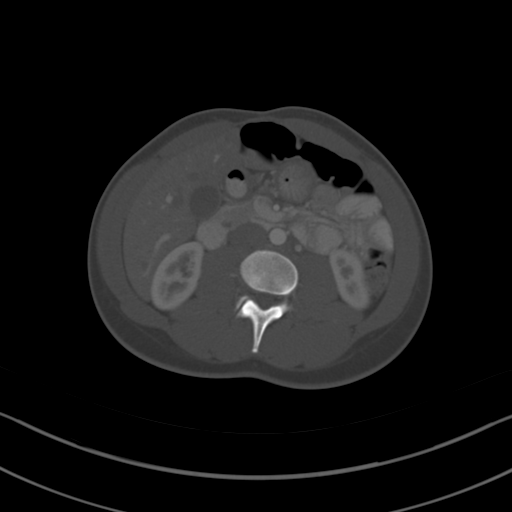
[im 54/77  soft-tissue]
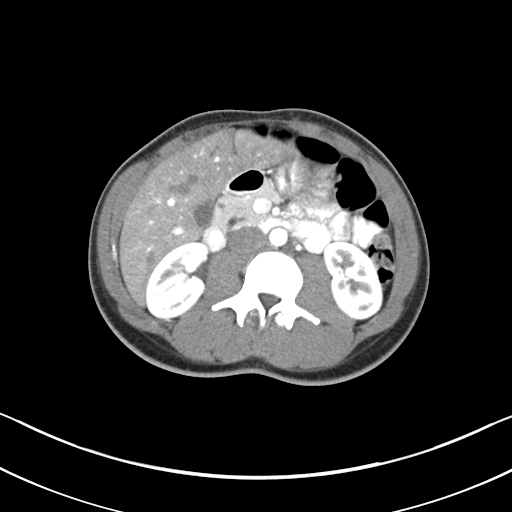
[im 61/77  soft-tissue]
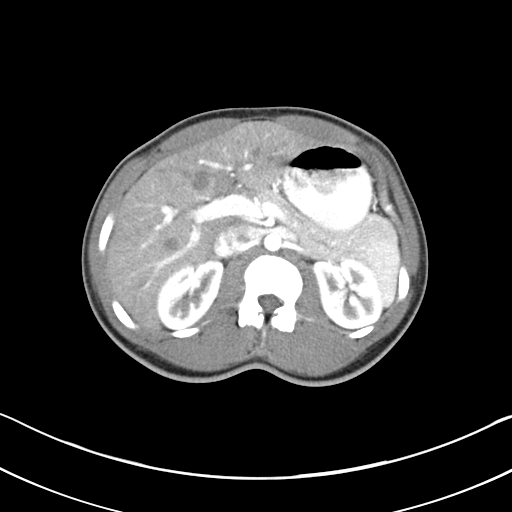
[im 67/77  soft-tissue]
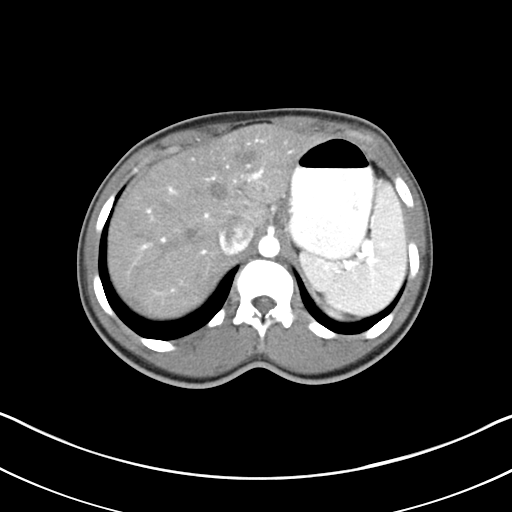
[im 73/77  soft-tissue]
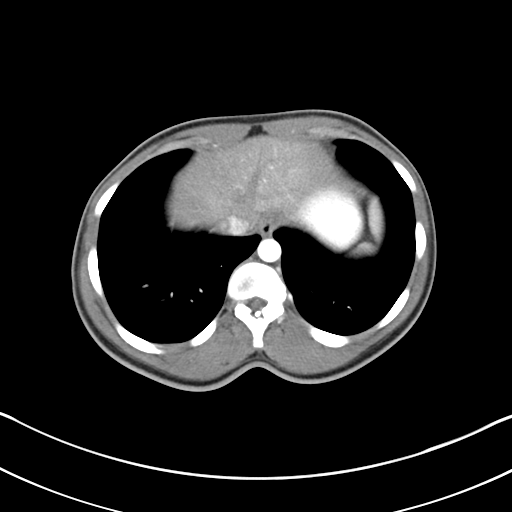

[Series 5: coronal a/|p · coronal · 0.63mm/px · 3 of 73 slices shown]
[im 25/73  soft-tissue]
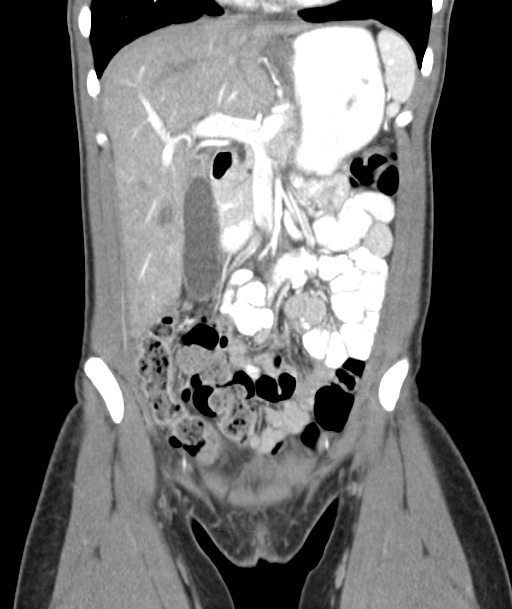
[im 33/73  soft-tissue]
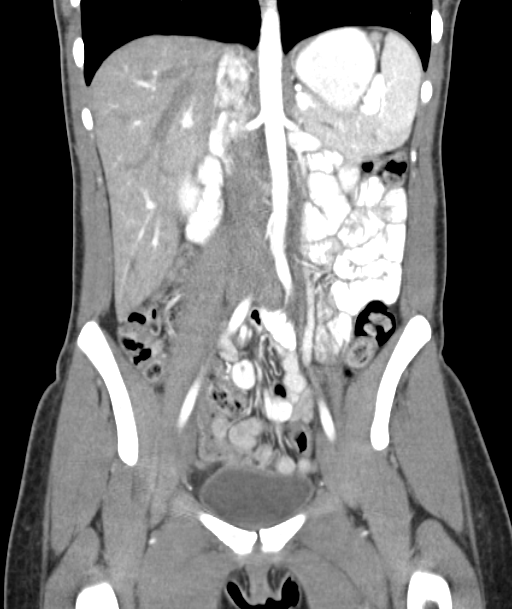
[im 41/73  soft-tissue]
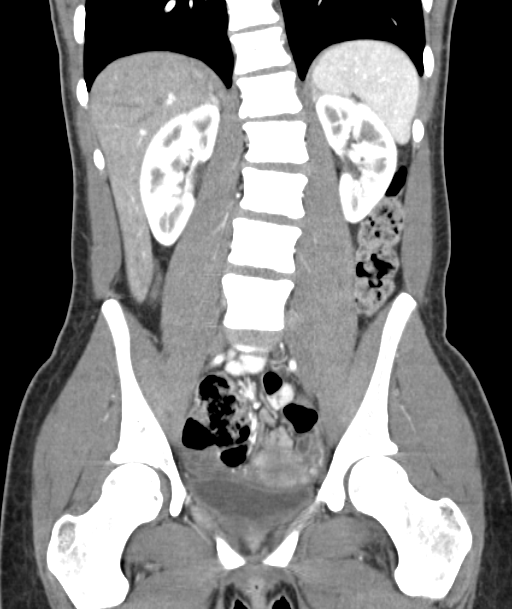

[16 of 46 positions shown; findings below may reference images not displayed]

FINDINGS: Lower chest:  No significant abnormality

Hepatobiliary: There are normal appearances of the liver,
gallbladder and bile ducts.

Pancreas: Normal

Spleen: Normal

Adrenals/Urinary Tract: The adrenals and kidneys are normal in
appearance. There is no urinary calculus evident. There is no
hydronephrosis or ureteral dilatation. Collecting systems and
ureters appear unremarkable.

Stomach/Bowel: The stomach, small bowel and colon appear
unremarkable.

Vascular/Lymphatic: The abdominal aorta is normal in caliber. There
is no atherosclerotic calcification. There is no adenopathy in the
abdomen or pelvis.

Reproductive: The uterus and ovaries appear unremarkable

Other: No focal inflammatory changes are evident in the abdomen or
pelvis. There is no ascites.

Musculoskeletal: No significant musculoskeletal lesion. There is
left convex curvature centered at L2.
IMPRESSION: No acute findings.  No significant abnormality.

## 2017-05-05 ENCOUNTER — Inpatient Hospital Stay (HOSPITAL_COMMUNITY)
Admission: AD | Admit: 2017-05-05 | Discharge: 2017-05-05 | Disposition: A | Discharge status: Home / Self Care | Payer: Medicaid Other | Source: Ambulatory Visit | Attending: Obstetrics and Gynecology | Admitting: Obstetrics and Gynecology

## 2017-05-05 ENCOUNTER — Encounter (HOSPITAL_COMMUNITY): Payer: Self-pay

## 2017-05-05 DIAGNOSIS — Z833 Family history of diabetes mellitus: Secondary | ICD-10-CM | POA: Insufficient documentation

## 2017-05-05 DIAGNOSIS — F17210 Nicotine dependence, cigarettes, uncomplicated: Secondary | ICD-10-CM | POA: Insufficient documentation

## 2017-05-05 DIAGNOSIS — B373 Candidiasis of vulva and vagina: Secondary | ICD-10-CM | POA: Insufficient documentation

## 2017-05-05 DIAGNOSIS — B3731 Acute candidiasis of vulva and vagina: Secondary | ICD-10-CM

## 2017-05-05 LAB — WET PREP, GENITAL
Sperm: NONE SEEN
Trich, Wet Prep: NONE SEEN

## 2017-05-05 LAB — URINALYSIS, ROUTINE W REFLEX MICROSCOPIC
Bilirubin Urine: NEGATIVE
Glucose, UA: NEGATIVE mg/dL
Ketones, ur: NEGATIVE mg/dL
NITRITE: NEGATIVE
Protein, ur: NEGATIVE mg/dL
SPECIFIC GRAVITY, URINE: 1.018 (ref 1.005–1.030)
pH: 5 (ref 5.0–8.0)

## 2017-05-05 LAB — POCT PREGNANCY, URINE: Preg Test, Ur: NEGATIVE

## 2017-05-05 MED ORDER — FLUCONAZOLE 150 MG PO TABS: 150.0000 mg | ORAL_TABLET | Freq: Every day | ORAL | 0 refills | Status: DC

## 2017-05-05 MED ORDER — TERCONAZOLE 0.4 % VA CREA: 1.0000 | TOPICAL_CREAM | Freq: Every day | VAGINAL | 0 refills | Status: DC

## 2017-05-05 NOTE — MAU Provider Note (Signed)
History     CSN: [id number]  Arrival date and time: 05/05/17 1224   First Provider Initiated Contact with Patient 05/05/17 1302      Chief Complaint  Patient presents with  . Vaginal Itching  . Vaginal Pain   HPI Heather Lane 26 y.o. Comes to MAU today after being treated for one week with Metronidazole for BV and Trich on 04-15-17.  Completed treatment and then began itching.  Started monistat internally for 4 days and it is not resolving her problem.  Continues to be itching inside and outside.  Denies using any other creams or products.   OB History    Gravida Para Term Preterm AB Living   1 1       1    SAB TAB Ectopic Multiple Live Births                  Past Medical History:  Diagnosis Date  . Chlamydia trachomatis infection of genitourinary sites 2009  . Depression   . Hepatitis C antibody test positive    this is written in admission paperwork in 2012.  needs to be confirmed with blood tests.   Marland Kitchen HSV-1 (herpes simplex virus 1) infection   . PID (pelvic inflammatory disease)     Past Surgical History:  Procedure Laterality Date  . CESAREAN SECTION  05/2011    Family History  Problem Relation Age of Onset  . Diabetes Sister   . Diabetes Maternal Aunt   . Hypertension Maternal Aunt   . Diabetes Maternal Uncle   . Hypertension Maternal Uncle     Social History  Substance Use Topics  . Smoking status: Current Some Day Smoker    Packs/day: 1.00    Types: Cigarettes    Last attempt to quit: 03/15/2014  . Smokeless tobacco: Never Used  . Alcohol use 1.8 oz/week    3 Glasses of wine per week    Allergies:  Allergies  Allergen Reactions  . Pollen Extract Itching    Prescriptions Prior to Admission  Medication Sig Dispense Refill Last Dose  . albuterol (PROVENTIL HFA;VENTOLIN HFA) 108 (90 BASE) MCG/ACT inhaler Inhale 2 puffs into the lungs every 4 (four) hours as needed for wheezing or shortness of breath.   Unknown at Unknown time  . albuterol  (PROVENTIL) (2.5 MG/3ML) 0.083% nebulizer solution Take 5 mg by nebulization every 6 (six) hours as needed for wheezing or shortness of breath.   Unknown at Unknown time  . diazepam (VALIUM) 2 MG tablet Take 1 tablet (2 mg total) by mouth every 6 (six) hours as needed for anxiety. 12 tablet 0   . hydrOXYzine (ATARAX/VISTARIL) 25 MG tablet Take 1 tablet (25 mg total) by mouth every 6 (six) hours as needed for anxiety. 1-2 tablets every 6 hours 30 tablet 0   . ipratropium-albuterol (DUONEB) 0.5-2.5 (3) MG/3ML SOLN Take 3 mLs by nebulization every 4 (four) hours as needed (wheezing).   Unknown at Unknown time    Review of Systems  Constitutional: Negative for fever.  Gastrointestinal: Negative for abdominal pain.  Genitourinary: Positive for vaginal discharge and vaginal pain.       Internal and external genital itching   Physical Exam   Blood pressure 115/71, pulse 85, temperature 98.2 F (36.8 C), temperature source Oral, resp. rate 16, height 5\' 5"  (1.651 m), weight 130 lb 0.6 oz (59 kg), last menstrual period 04/02/2017.  Physical Exam  Nursing note and vitals reviewed. Constitutional: She is oriented to person, place,  and time. She appears well-developed and well-nourished.  HENT:  Head: Normocephalic.  Eyes: EOM are normal.  Neck: Neck supple.  GI: Soft. There is no tenderness.  Genitourinary:  Genitourinary Comments: Speculum exam: Vulva - clitoris and surrounding tissue edematous and appears mildly fluid filled Vagina - Firey red with large amount of thick adherent yellow discharge. No blood Cervix - No contact bleeding, closed Bimanual exam deferred: GC/Chlam, wet prep done Chaperone present for exam.   Musculoskeletal: Normal range of motion.  Neurological: She is alert and oriented to person, place, and time.  Skin: Skin is warm and dry.  Psychiatric: She has a normal mood and affect.    MAU Course  Procedures Results for orders placed or performed during the hospital  encounter of 05/05/17 (from the past 24 hour(s))  Urinalysis, Routine w reflex microscopic     Status: Abnormal   Collection Time: 05/05/17 12:35 PM  Result Value Ref Range   Color, Urine YELLOW YELLOW   APPearance CLOUDY (A) CLEAR   Specific Gravity, Urine 1.018 1.005 - 1.030   pH 5.0 5.0 - 8.0   Glucose, UA NEGATIVE NEGATIVE mg/dL   Hgb urine dipstick SMALL (A) NEGATIVE   Bilirubin Urine NEGATIVE NEGATIVE   Ketones, ur NEGATIVE NEGATIVE mg/dL   Protein, ur NEGATIVE NEGATIVE mg/dL   Nitrite NEGATIVE NEGATIVE   Leukocytes, UA LARGE (A) NEGATIVE   RBC / HPF 6-30 0 - 5 RBC/hpf   WBC, UA 6-30 0 - 5 WBC/hpf   Bacteria, UA RARE (A) NONE SEEN   Squamous Epithelial / LPF TOO NUMEROUS TO COUNT (A) NONE SEEN   Mucous PRESENT   Pregnancy, urine POC     Status: None   Collection Time: 05/05/17 12:39 PM  Result Value Ref Range   Preg Test, Ur NEGATIVE NEGATIVE  Wet prep, genital     Status: Abnormal   Collection Time: 05/05/17  1:03 PM  Result Value Ref Range   Yeast Wet Prep HPF POC PRESENT (A) NONE SEEN   Trich, Wet Prep NONE SEEN NONE SEEN   Clue Cells Wet Prep HPF POC PRESENT (A) NONE SEEN   WBC, Wet Prep HPF POC TOO NUMEROUS TO COUNT (A) NONE SEEN   Sperm NONE SEEN     MDM [facility].  Likely has a yeast infection that is not responding to the monistat.  Will not treat for BV at this time  - clinically, this is a severe yeast infection and we need to clear that first.  Assessment and Plan  Yeast infection  Plan Prescribed Diflucan and Terazol to her pharmacy.  Discussed using cream for one week and taking diflucan on days 1,4, and7. [facility] if this does not clear up. Condoms always for protection from pregnancy and STI    Jearl Soto L Eryx Zane 05/05/2017, 1:15 PM

## 2017-05-05 NOTE — Discharge Instructions (Signed)
Get your prescriptions and use as directed. Be [facility] at the [facility] in [facility]D clinic (even though you do not have an STD right now - they can help with vaginal problems and you can be seen faster).

## 2017-05-05 NOTE — MAU Note (Signed)
Patient has been recently treated in [facility] for BV and Trich, took medication now has a clumpy white discharge with extreme vaginal itching.

## 2017-05-06 LAB — GC/CHLAMYDIA PROBE AMP (~~LOC~~) NOT AT ARMC
Chlamydia: NEGATIVE
NEISSERIA GONORRHEA: NEGATIVE

## 2017-09-08 ENCOUNTER — Encounter (HOSPITAL_COMMUNITY): Payer: Self-pay

## 2017-09-08 ENCOUNTER — Inpatient Hospital Stay (HOSPITAL_COMMUNITY)
Admission: AD | Admit: 2017-09-08 | Discharge: 2017-09-08 | Disposition: A | Discharge status: Home / Self Care | Payer: Self-pay | Source: Ambulatory Visit | Attending: Obstetrics & Gynecology | Admitting: Obstetrics & Gynecology

## 2017-09-08 DIAGNOSIS — F17210 Nicotine dependence, cigarettes, uncomplicated: Secondary | ICD-10-CM | POA: Insufficient documentation

## 2017-09-08 DIAGNOSIS — R062 Wheezing: Secondary | ICD-10-CM | POA: Diagnosis present

## 2017-09-08 DIAGNOSIS — R059 Cough, unspecified: Secondary | ICD-10-CM | POA: Diagnosis present

## 2017-09-08 DIAGNOSIS — R05 Cough: Secondary | ICD-10-CM | POA: Insufficient documentation

## 2017-09-08 DIAGNOSIS — N939 Abnormal uterine and vaginal bleeding, unspecified: Secondary | ICD-10-CM | POA: Insufficient documentation

## 2017-09-08 DIAGNOSIS — Z3202 Encounter for pregnancy test, result negative: Secondary | ICD-10-CM | POA: Insufficient documentation

## 2017-09-08 LAB — POCT PREGNANCY, URINE: PREG TEST UR: NEGATIVE

## 2017-09-08 LAB — URINALYSIS, ROUTINE W REFLEX MICROSCOPIC
Bilirubin Urine: NEGATIVE
GLUCOSE, UA: NEGATIVE mg/dL
Ketones, ur: NEGATIVE mg/dL
Leukocytes, UA: NEGATIVE
Nitrite: NEGATIVE
PROTEIN: NEGATIVE mg/dL
Specific Gravity, Urine: 1.02 (ref 1.005–1.030)
pH: 6.5 (ref 5.0–8.0)

## 2017-09-08 LAB — URINALYSIS, MICROSCOPIC (REFLEX)

## 2017-09-08 LAB — HCG, SERUM, QUALITATIVE: Preg, Serum: NEGATIVE

## 2017-09-08 MED ORDER — BENZONATATE 100 MG PO CAPS: 100.0000 mg | ORAL_CAPSULE | Freq: Three times a day (TID) | ORAL | 0 refills | Status: DC | PRN

## 2017-09-08 MED ORDER — ALBUTEROL SULFATE HFA 108 (90 BASE) MCG/ACT IN AERS: 2.0000 | INHALATION_SPRAY | Freq: Four times a day (QID) | RESPIRATORY_TRACT | 2 refills | Status: DC | PRN

## 2017-09-08 NOTE — MAU Provider Note (Signed)
History     CSN: [id number]  Arrival date and time: 09/08/17 1024   First Provider Initiated Contact with Patient 09/08/17 1054      Chief Complaint  Patient presents with  . Vaginal Bleeding   HPI  Heather Lane is a 26 y.o. G1P1001 non-pregnant female presenting to MAU with complaints of bleeding like a period.  She states her period was only 2 days last month.  She is trying to get pregnant.  She reports (+) HPT last week.  Past Medical History:  Diagnosis Date  . Chlamydia trachomatis infection of genitourinary sites 2009  . Depression    prior taking medications for PP depression  . Hepatitis C antibody test positive    this is written in admission paperwork in 2012.  needs to be confirmed with blood tests.   Marland Kitchen HSV-1 (herpes simplex virus 1) infection   . PID (pelvic inflammatory disease)     Past Surgical History:  Procedure Laterality Date  . CESAREAN SECTION  05/2011   Arrest of dilation    Family History  Problem Relation Age of Onset  . Diabetes Sister   . Diabetes Maternal Aunt   . Hypertension Maternal Aunt   . Diabetes Maternal Uncle   . Hypertension Maternal Uncle     Social History  Substance Use Topics  . Smoking status: Current Some Day Smoker    Packs/day: 1.00    Types: Cigarettes    Last attempt to quit: 03/15/2014  . Smokeless tobacco: Never Used  . Alcohol use 1.8 oz/week    3 Glasses of wine per week    Allergies:  Allergies  Allergen Reactions  . Pollen Extract Itching    Prescriptions Prior to Admission  Medication Sig Dispense Refill Last Dose  . guaiFENesin (MUCINEX) 600 MG 12 hr tablet Take 600 mg by mouth 2 (two) times daily as needed for cough or to loosen phlegm.   09/07/2017 at Unknown time  . Pseudoeph-Doxylamine-DM-APAP (DAYQUIL/NYQUIL COLD/FLU RELIEF PO) Take 2 tablets by mouth 2 (two) times daily as needed (cold and flu symptoms).    09/07/2017 at Unknown time  . albuterol (PROVENTIL HFA;VENTOLIN HFA) 108 (90 BASE)  MCG/ACT inhaler Inhale 2 puffs into the lungs every 4 (four) hours as needed for wheezing or shortness of breath.   over a year ago  . albuterol (PROVENTIL) (2.5 MG/3ML) 0.083% nebulizer solution Take 5 mg by nebulization every 6 (six) hours as needed for wheezing or shortness of breath.   over a year ago  . fluconazole (DIFLUCAN) 150 MG tablet Take 1 tablet (150 mg total) by mouth daily. Take on Day 1, 4, and 7 (Patient not taking: Reported on 09/08/2017) 3 tablet 0 Not Taking at Unknown time  . terconazole (TERAZOL 7) 0.4 % vaginal cream Place 1 applicator vaginally at bedtime. Use for 7 days. (Patient not taking: Reported on 09/08/2017) 45 g 0 Not Taking at Unknown time    Review of Systems  Constitutional: Negative.   HENT: Positive for congestion.   Eyes: Negative.   Respiratory: Positive for cough, chest tightness and wheezing.   Cardiovascular: Negative.   Gastrointestinal: Negative.   Endocrine: Negative.   Genitourinary: Positive for vaginal bleeding.  Musculoskeletal: Negative.   Skin: Negative.   Allergic/Immunologic: Negative.   Neurological: Negative.   Hematological: Negative.   Psychiatric/Behavioral: Negative.    Physical Exam   Blood pressure 113/77, pulse (!) 131, temperature 98.7 F (37.1 C), temperature source Oral, resp. rate 16.  Physical  Exam  Nursing note and vitals reviewed. Constitutional: She is oriented to person, place, and time. She appears well-developed and well-nourished.  HENT:  Head: Normocephalic.  Eyes: Pupils are equal, round, and reactive to light.  Neck: Normal range of motion.  Cardiovascular: Normal rate, regular rhythm and normal heart sounds.   Respiratory: Effort normal. She has wheezes.  GI: Soft. Bowel sounds are normal.  Genitourinary:  Genitourinary Comments: Pelvic declined  Musculoskeletal: Normal range of motion.  Neurological: She is alert and oriented to person, place, and time.  Skin: Skin is warm and dry.  Psychiatric:  She has a normal mood and affect. Her behavior is normal. Thought content normal.    MAU Course  Procedures  MDM CCUA UPT HCG Qualitative  Results for orders placed or performed during the hospital encounter of 09/08/17 (from the past 24 hour(s))  Urinalysis, Routine w reflex microscopic     Status: Abnormal   Collection Time: 09/08/17 10:30 AM  Result Value Ref Range   Color, Urine YELLOW YELLOW   APPearance CLEAR CLEAR   Specific Gravity, Urine 1.020 1.005 - 1.030   pH 6.5 5.0 - 8.0   Glucose, UA NEGATIVE NEGATIVE mg/dL   Hgb urine dipstick LARGE (A) NEGATIVE   Bilirubin Urine NEGATIVE NEGATIVE   Ketones, ur NEGATIVE NEGATIVE mg/dL   Protein, ur NEGATIVE NEGATIVE mg/dL   Nitrite NEGATIVE NEGATIVE   Leukocytes, UA NEGATIVE NEGATIVE  Urinalysis, Microscopic (reflex)     Status: Abnormal   Collection Time: 09/08/17 10:30 AM  Result Value Ref Range   RBC / HPF 6-30 0 - 5 RBC/hpf   WBC, UA 0-5 0 - 5 WBC/hpf   Bacteria, UA RARE (A) NONE SEEN   Squamous Epithelial / LPF 0-5 (A) NONE SEEN   Urine-Other LESS THAN 10 mL OF URINE SUBMITTED   Pregnancy, urine POC     Status: None   Collection Time: 09/08/17 10:39 AM  Result Value Ref Range   Preg Test, Ur NEGATIVE NEGATIVE  hCG, serum, qualitative     Status: None   Collection Time: 09/08/17 10:59 AM  Result Value Ref Range   Preg, Serum NEGATIVE NEGATIVE    Assessment and Plan  Vaginal bleeding - Reassured that bleeding is from a normal menstrual cycle - May take Tylenol for pain  Bilateral wheezing - Rx for Proventil inhaler  Cough in adult - Rx for [employer]  Discharge home Patient verbalized an understanding of the plan of care and agrees.    Raelyn Mora, MSN, CNM 09/08/2017, 10:54 AM

## 2017-09-08 NOTE — Progress Notes (Addendum)
G1P1 prior hx of C/S for arrest of dilation. Pt states she is [redacted] wksga by HPT last week but started to bleeding this morning like a period as to reason why she presents to triage today. States last month her period lasted 2 days. States trying to get pregnant.   1054: provider at bs assessing pt. Lab ordered for blood pregn test  Serum Blood test for pregn negative.   1216: Discharge instructions given with pt understanding. Pt left unit ambulatory with SO.

## 2017-12-01 ENCOUNTER — Emergency Department (HOSPITAL_BASED_OUTPATIENT_CLINIC_OR_DEPARTMENT_OTHER)
Admission: EM | Admit: 2017-12-01 | Discharge: 2017-12-01 | Disposition: A | Discharge status: Home / Self Care | Payer: Medicaid Other | Attending: Emergency Medicine | Admitting: Emergency Medicine

## 2017-12-01 ENCOUNTER — Encounter (HOSPITAL_BASED_OUTPATIENT_CLINIC_OR_DEPARTMENT_OTHER): Payer: Self-pay | Admitting: *Deleted

## 2017-12-01 ENCOUNTER — Other Ambulatory Visit: Payer: Self-pay

## 2017-12-01 DIAGNOSIS — F17210 Nicotine dependence, cigarettes, uncomplicated: Secondary | ICD-10-CM | POA: Diagnosis not present

## 2017-12-01 DIAGNOSIS — Z3201 Encounter for pregnancy test, result positive: Secondary | ICD-10-CM | POA: Insufficient documentation

## 2017-12-01 LAB — URINALYSIS, ROUTINE W REFLEX MICROSCOPIC
Bilirubin Urine: NEGATIVE
GLUCOSE, UA: NEGATIVE mg/dL
HGB URINE DIPSTICK: NEGATIVE
Ketones, ur: NEGATIVE mg/dL
LEUKOCYTES UA: NEGATIVE
Nitrite: NEGATIVE
PH: 6 (ref 5.0–8.0)
Protein, ur: NEGATIVE mg/dL
SPECIFIC GRAVITY, URINE: 1.025 (ref 1.005–1.030)

## 2017-12-01 LAB — PREGNANCY, URINE: PREG TEST UR: POSITIVE — AB

## 2017-12-01 MED ORDER — PRENATAL VITAMINS 0.8 MG PO TABS: 1.0000 | ORAL_TABLET | Freq: Every day | ORAL | 3 refills | Status: DC

## 2017-12-01 NOTE — ED Notes (Signed)
Patient denies any pain.  She just want to confirm if she is really pregnant.  She had 2 pregnancy tests and it both resulted positive.

## 2017-12-01 NOTE — ED Triage Notes (Signed)
Patient states she has lower abdominal cramping pain and is late for her menstrual cycle.  LMP 10/18/17.  Denies vaginal discharge or urinary symptoms.

## 2017-12-01 NOTE — Discharge Instructions (Signed)
You were seen here today for positive pregnancy test.  You are currently asymptomatic at this time.  I will start you on a prenatal vitamin.  I have attached a handout on prenatal care.  Please follow-up with women's and [facility] to establish prenatal care as soon as possible.  There also no clinics in [facility] that may accept persons without insurance but we are not affiliated with them.  If you develop any vaginal bleeding or severe abdominal pain or any other concerning symptoms please return to the emergency department.

## 2017-12-01 NOTE — ED Provider Notes (Signed)
[facility] EMERGENCY DEPARTMENT Provider Note   CSN: [id number] Arrival date & time: 12/01/17  1414     History   Chief Complaint Chief Complaint  Patient presents with  . Abdominal Pain    HPI Heather KelpLachelle Lane is a 26 y.o. female G2P1 female with a history of STI's who presents the emergency department today for positive pregnancy test.  Patient states that her last menstrual cycle was on 10/18/2017.  Patient notes that she was late for her cycle and had 2+ pregnancy tests at home.  She is presenting today because she does not have insurance is sure where to go for care.  Patient noted that during the week she had some lower abdominal pressure after eating that lasted for approximately 1 minute but has been asymptomatic since.  No current abdominal pain.  She denies any vaginal bleeding, vaginal spotting or vaginal discharge.  She denies any urinary symptoms.  The patient is currently sexually active with one female partner in the last year.  Patient is not currently taking any prenatal vitamins.   HPI  Past Medical History:  Diagnosis Date  . Chlamydia trachomatis infection of genitourinary sites 2009  . Depression    prior taking medications for PP depression  . Hepatitis C antibody test positive    this is written in admission paperwork in 2012.  needs to be confirmed with blood tests.   Marland Kitchen. HSV-1 (herpes simplex virus 1) infection   . PID (pelvic inflammatory disease)     Patient Active Problem List   Diagnosis Date Noted  . Vaginal bleeding 09/08/2017  . Bilateral wheezing 09/08/2017  . Cough in adult 09/08/2017  . Pelvic abscess in female 03/21/2014  . Chlamydia 03/21/2014  . History of herpes genitalis 03/21/2014  . Gonorrhea 03/14/2014  . Previous cesarean delivery 01/27/2013  . Cigarette smoker 01/27/2013    Past Surgical History:  Procedure Laterality Date  . CESAREAN SECTION  05/2011   Arrest of dilation    OB History    Gravida Para Term Preterm  AB Living   1 1 1     1    SAB TAB Ectopic Multiple Live Births           1       Home Medications    Prior to Admission medications   Medication Sig Start Date End Date Taking? Authorizing Provider  albuterol (PROVENTIL HFA;VENTOLIN HFA) 108 (90 BASE) MCG/ACT inhaler Inhale 2 puffs into the lungs every 4 (four) hours as needed for wheezing or shortness of breath.    [provider]  albuterol (PROVENTIL HFA;VENTOLIN HFA) 108 (90 Base) MCG/ACT inhaler Inhale 2 puffs into the lungs every 6 (six) hours as needed for wheezing or shortness of breath. 09/08/17   Raelyn Moraawson, Rolitta, CNM  albuterol (PROVENTIL) (2.5 MG/3ML) 0.083% nebulizer solution Take 5 mg by nebulization every 6 (six) hours as needed for wheezing or shortness of breath.    [provider]  benzonatate (TESSALON PERLES) 100 MG capsule Take 1 capsule (100 mg total) by mouth 3 (three) times daily as needed for cough. 09/08/17   Raelyn Moraawson, Rolitta, CNM  guaiFENesin (MUCINEX) 600 MG 12 hr tablet Take 600 mg by mouth 2 (two) times daily as needed for cough or to loosen phlegm.    [provider]  Pseudoeph-Doxylamine-DM-APAP (DAYQUIL/NYQUIL COLD/FLU RELIEF PO) Take 2 tablets by mouth 2 (two) times daily as needed (cold and flu symptoms).     [provider]    [facility]  History Family History  Problem Relation Age of Onset  . Diabetes Sister   . Diabetes Maternal Aunt   . Hypertension Maternal Aunt   . Diabetes Maternal Uncle   . Hypertension Maternal Uncle     Social History Social History   Tobacco Use  . Smoking status: Current Some Day Smoker    Packs/day: 1.00    Types: Cigarettes    Last attempt to quit: 03/15/2014    Years since quitting: 3.7  . Smokeless tobacco: Never Used  Substance Use Topics  . Alcohol use: Yes    Alcohol/week: 1.8 oz    Types: 3 Glasses of wine per week  . Drug use: Yes    Types: Marijuana     Allergies   Pollen extract   Review of Systems Review of  Systems  All other systems reviewed and are negative.    Physical Exam Updated Vital Signs BP 105/65 (BP Location: Left Arm)   Temp 99.3 F (37.4 C) (Oral)   Resp 18   Ht 5\' 5"  (1.651 m)   Wt 59.3 kg (130 lb 11.7 oz)   LMP 10/18/2017 (Exact Date)   SpO2 100%   BMI 21.76 kg/m   Physical Exam  Constitutional: She appears well-developed and well-nourished.  HENT:  Head: Normocephalic and atraumatic.  Right Ear: External ear normal.  Left Ear: External ear normal.  Nose: Nose normal.  Mouth/Throat: Uvula is midline, oropharynx is clear and moist and mucous membranes are normal. No tonsillar exudate.  Eyes: Pupils are equal, round, and reactive to light. Right eye exhibits no discharge. Left eye exhibits no discharge. No scleral icterus.  Neck: Trachea normal. Neck supple. No spinous process tenderness present. No neck rigidity. Normal range of motion present.  Cardiovascular: Normal rate, regular rhythm and intact distal pulses.  No murmur heard. Pulses:      Radial pulses are 2+ on the right side, and 2+ on the left side.       Dorsalis pedis pulses are 2+ on the right side, and 2+ on the left side.       Posterior tibial pulses are 2+ on the right side, and 2+ on the left side.  No lower extremity swelling or edema. Calves symmetric in size bilaterally.  Pulmonary/Chest: Effort normal and breath sounds normal. She exhibits no tenderness.  Abdominal: Soft. Bowel sounds are normal. She exhibits no distension. There is no tenderness. There is no rigidity, no rebound, no guarding and no CVA tenderness.  Gravid abdomen  Musculoskeletal: She exhibits no edema.  Lymphadenopathy:    She has no cervical adenopathy.  Neurological: She is alert.  Skin: Skin is warm and dry. No rash noted. She is not diaphoretic.  Psychiatric: She has a normal mood and affect.  Nursing note and vitals reviewed.    ED Treatments / Results  Labs (all labs ordered are listed, but only abnormal  results are displayed) Labs Reviewed  PREGNANCY, URINE - Abnormal; Notable for the following components:      Result Value   Preg Test, Ur POSITIVE (*)    All other components within normal limits  URINALYSIS, ROUTINE W REFLEX MICROSCOPIC    EKG  EKG Interpretation None       Radiology No results found.  Procedures Procedures (including critical care time)  Medications Ordered in ED Medications - No data to display   Initial Impression / Assessment and Plan / ED Course  I have reviewed the triage vital signs and the nursing  notes.  Pertinent labs & imaging results that were available during my care of the patient were reviewed by me and considered in my medical decision making (see chart for details).     26 year old G2P1 female with 2 positive pregnancy tests at home presenting because she was unsure where to go to start her care.  She is asymptomatic without any abdominal pain, vaginal bleeding, vaginal spotting or vaginal discharge. On exam the patient has a gravid abdomen without any tenderness or peritoneal signs.  Do not feel the patient needs any further workup at this time.  Patient is not currently taking a prenatal vitamin.  Will prescribe the patient a prenatal vitamin and refer her over to women's for prenatal care.  I discussed reasons to return to the emergency department including but not limited to vaginal bleeding or abdominal pain.  The patient appears reliable for follow-up.  She is safe for discharge. Patient case discussed with Dr. Lynelle DoctorKnapp who is in agreement with plan.  Final Clinical Impressions(s) / ED Diagnoses   Final diagnoses:  Positive pregnancy test    ED Discharge Orders        Ordered    Prenatal Multivit-Min-Fe-FA (PRENATAL VITAMINS) 0.8 MG tablet  Daily     12/01/17 1658       Princella PellegriniMaczis, Lashay Osborne M, PA-C 12/02/17 0058    Linwood DibblesKnapp, Jon, MD 12/04/17 [phone]

## 2017-12-10 ENCOUNTER — Other Ambulatory Visit: Payer: Self-pay

## 2017-12-10 ENCOUNTER — Encounter (HOSPITAL_COMMUNITY): Payer: Self-pay | Admitting: *Deleted

## 2017-12-10 ENCOUNTER — Inpatient Hospital Stay (HOSPITAL_COMMUNITY)
Admission: AD | Admit: 2017-12-10 | Discharge: 2017-12-10 | Disposition: A | Discharge status: Home / Self Care | Payer: Medicaid Other | Source: Ambulatory Visit | Attending: Obstetrics and Gynecology | Admitting: Obstetrics and Gynecology

## 2017-12-10 DIAGNOSIS — Z3201 Encounter for pregnancy test, result positive: Secondary | ICD-10-CM

## 2017-12-10 NOTE — MAU Provider Note (Signed)
Ms. Heather Lane is a 27 y.o. G2P1001 who present to MAU today for pregnancy confirmation and STD testing. She denies abdominal pain or vaginal bleeding.   BP 110/62 (BP Location: Left Arm)   Pulse 89   Temp 98.8 F (37.1 C) (Oral)   Resp 18   Ht 5\' 5"  (1.651 m)   Wt 133 lb (60.3 kg)   LMP 10/18/2017 (Exact Date)   SpO2 100%   BMI 22.13 kg/m  CONSTITUTIONAL: Well-developed, well-nourished female in no acute distress.  CARDIOVASCULAR: Normal heart rate noted RESPIRATORY: Effort and breath sounds normal GASTROINTESTINAL:Soft, no distention noted.  No tenderness, rebound or guarding.  SKIN: Skin is warm and dry. No rash noted. Not diaphoretic. No erythema. No pallor. PSYCHIATRIC: Normal mood and affect. Normal behavior. Normal judgment and thought content.  MDM Medical screening exam complete Patient does not endorse any symptoms concerning for ectopic pregnancy or pregnancy related complication today.  Discussed care during pregnancy and locations that takes patient while she does not have insurance.   A:  Amenorrhea Positive pregnancy test  P: Discharge home Patient advised of positive pregnancy test on 12/01/17 at [facility]  Patient may return to MAU as needed or if her condition were to change or worsen Pregnancy confirmation letter given to patient. [facility] and STD testing due to having no insurance   Sharyon CableRogers, Dev Dhondt C, [place] 12/10/2017 3:45 PM

## 2017-12-10 NOTE — MAU Note (Signed)
Pt present with request for pregnancy test & STI testing.  Reports had +UPT 11/23/2017.  Denies VB or abdominal cramping. LMP 10/18/2017.

## 2017-12-10 NOTE — Progress Notes (Signed)
Pt discharged home after medical screening completed by V. Aundria Rudogers, CNM.  Pt already had +UPT results from 12/01/17.  Will have STI testing @ HD.

## 2017-12-25 LAB — OB RESULTS CONSOLE ABO/RH: RH TYPE: POSITIVE

## 2017-12-25 LAB — OB RESULTS CONSOLE RPR: RPR: NONREACTIVE

## 2017-12-25 LAB — OB RESULTS CONSOLE ANTIBODY SCREEN: Antibody Screen: NEGATIVE

## 2017-12-25 LAB — OB RESULTS CONSOLE HEPATITIS B SURFACE ANTIGEN: Hepatitis B Surface Ag: NEGATIVE

## 2017-12-25 LAB — OB RESULTS CONSOLE RUBELLA ANTIBODY, IGM: RUBELLA: IMMUNE

## 2017-12-25 LAB — OB RESULTS CONSOLE HIV ANTIBODY (ROUTINE TESTING): HIV: NONREACTIVE

## 2017-12-25 LAB — OB RESULTS CONSOLE GC/CHLAMYDIA
Chlamydia: NEGATIVE
Gonorrhea: NEGATIVE

## 2018-01-04 ENCOUNTER — Encounter (HOSPITAL_COMMUNITY): Payer: Self-pay | Admitting: *Deleted

## 2018-01-04 ENCOUNTER — Inpatient Hospital Stay (HOSPITAL_COMMUNITY)
Admission: AD | Admit: 2018-01-04 | Discharge: 2018-01-04 | Disposition: A | Discharge status: Home / Self Care | Payer: Medicaid Other | Source: Ambulatory Visit | Attending: Obstetrics & Gynecology | Admitting: Obstetrics & Gynecology

## 2018-01-04 ENCOUNTER — Other Ambulatory Visit: Payer: Self-pay

## 2018-01-04 DIAGNOSIS — F17210 Nicotine dependence, cigarettes, uncomplicated: Secondary | ICD-10-CM | POA: Insufficient documentation

## 2018-01-04 DIAGNOSIS — O99331 Smoking (tobacco) complicating pregnancy, first trimester: Secondary | ICD-10-CM | POA: Insufficient documentation

## 2018-01-04 DIAGNOSIS — R109 Unspecified abdominal pain: Secondary | ICD-10-CM | POA: Insufficient documentation

## 2018-01-04 DIAGNOSIS — O26891 Other specified pregnancy related conditions, first trimester: Secondary | ICD-10-CM | POA: Insufficient documentation

## 2018-01-04 DIAGNOSIS — N760 Acute vaginitis: Secondary | ICD-10-CM

## 2018-01-04 DIAGNOSIS — Z3A11 11 weeks gestation of pregnancy: Secondary | ICD-10-CM | POA: Diagnosis not present

## 2018-01-04 DIAGNOSIS — B9689 Other specified bacterial agents as the cause of diseases classified elsewhere: Secondary | ICD-10-CM

## 2018-01-04 LAB — URINALYSIS, ROUTINE W REFLEX MICROSCOPIC
Bilirubin Urine: NEGATIVE
GLUCOSE, UA: NEGATIVE mg/dL
Hgb urine dipstick: NEGATIVE
KETONES UR: NEGATIVE mg/dL
LEUKOCYTES UA: NEGATIVE
Nitrite: NEGATIVE
PH: 7 (ref 5.0–8.0)
Protein, ur: NEGATIVE mg/dL
Specific Gravity, Urine: 1.028 (ref 1.005–1.030)

## 2018-01-04 LAB — WET PREP, GENITAL
Sperm: NONE SEEN
Trich, Wet Prep: NONE SEEN
Yeast Wet Prep HPF POC: NONE SEEN

## 2018-01-04 MED ORDER — METRONIDAZOLE 0.75 % VA GEL: 1.0000 | Freq: Every day | VAGINAL | 0 refills | Status: DC

## 2018-01-04 MED ORDER — ACETAMINOPHEN 500 MG PO TABS
1000.0000 mg | ORAL_TABLET | Freq: Once | ORAL | Status: AC
  Administered 2018-01-04: 1000 mg via ORAL
  Filled 2018-01-04: qty 2

## 2018-01-04 NOTE — MAU Provider Note (Signed)
History    Heather Lane is a 26y.o. G2P1001 who presents to the MAU, unannounced, for abdominal cramping.  Patient states cramping started around midnight as she was awakened from sleep.  Patient describes pain as intermittent feelings of "knots" in lower abdominal and pelvic area.  Patient denies issues with constipation, diarrhea, or N/V.  Patient further denies recent intercourse and denies need for STD testing. Patient does endorse increased vaginal discharge, but denies bleeding, smell, or pain during intercourse.  Patient did not take any medications prior to arrival.   Patient Active Problem List   Diagnosis Date Noted  . Vaginal bleeding 09/08/2017  . Bilateral wheezing 09/08/2017  . Cough in adult 09/08/2017  . Pelvic abscess in female 03/21/2014  . Chlamydia 03/21/2014  . History of herpes genitalis 03/21/2014  . Gonorrhea 03/14/2014  . Previous cesarean delivery 01/27/2013  . Cigarette smoker 01/27/2013    Chief Complaint  Patient presents with  . Abdominal Cramping   HPI  OB History    Gravida Para Term Preterm AB Living   2 1 1     1    SAB TAB Ectopic Multiple Live Births           1      Past Medical History:  Diagnosis Date  . Chlamydia trachomatis infection of genitourinary sites 2009  . Depression    prior taking medications for PP depression  . Hepatitis C antibody test positive    this is written in admission paperwork in 2012.  needs to be confirmed with blood tests.   Marland Kitchen. HSV-1 (herpes simplex virus 1) infection   . PID (pelvic inflammatory disease)     Past Surgical History:  Procedure Laterality Date  . CESAREAN SECTION  05/2011   Arrest of dilation    Family History  Problem Relation Age of Onset  . Diabetes Sister   . Diabetes Maternal Aunt   . Hypertension Maternal Aunt   . Diabetes Maternal Uncle   . Hypertension Maternal Uncle     Social History   Tobacco Use  . Smoking status: Current Some Day Smoker    Packs/day: 1.00    Types:  Cigarettes    Last attempt to quit: 03/15/2014    Years since quitting: 3.8  . Smokeless tobacco: Never Used  Substance Use Topics  . Alcohol use: Yes    Alcohol/week: 1.8 oz    Types: 3 Glasses of wine per week  . Drug use: Yes    Types: Marijuana    Allergies:  Allergies  Allergen Reactions  . Pollen Extract Itching    Medications Prior to Admission  Medication Sig Dispense Refill Last Dose  . Prenatal Multivit-Min-Fe-FA (PRENATAL VITAMINS) 0.8 MG tablet Take 1 tablet by mouth daily. 30 tablet 3 01/03/2018 at Unknown time  . albuterol (PROVENTIL HFA;VENTOLIN HFA) 108 (90 BASE) MCG/ACT inhaler Inhale 2 puffs into the lungs every 4 (four) hours as needed for wheezing or shortness of breath.   over a year ago  . albuterol (PROVENTIL HFA;VENTOLIN HFA) 108 (90 Base) MCG/ACT inhaler Inhale 2 puffs into the lungs every 6 (six) hours as needed for wheezing or shortness of breath. 1 Inhaler 2 Unknown at Unknown time  . albuterol (PROVENTIL) (2.5 MG/3ML) 0.083% nebulizer solution Take 5 mg by nebulization every 6 (six) hours as needed for wheezing or shortness of breath.   over a year ago    Review of Systems  Gastrointestinal: Positive for abdominal pain. Negative for constipation, diarrhea, nausea and  vomiting.    See HPI Above Physical Exam   Blood pressure 109/62, pulse 97, temperature 98 F (36.7 C), resp. rate 18, height 5\' 5"  (1.651 m), weight 59.9 kg (132 lb), last menstrual period 10/18/2017.  No results found for this or any previous visit (from the past 24 hour(s)).  Physical Exam  Constitutional: She is oriented to person, place, and time. She appears well-developed and well-nourished. No distress.  HENT:  Head: Normocephalic and atraumatic.  Eyes: Conjunctivae are normal.  Neck: Normal range of motion.  Cardiovascular: Normal rate, regular rhythm and normal heart sounds.  Respiratory: Effort normal and breath sounds normal.  GI: Soft. Bowel sounds are normal.   Musculoskeletal: Normal range of motion. She exhibits no edema.  Neurological: She is alert and oriented to person, place, and time.  Skin: Skin is warm and dry.  Psychiatric: She has a normal mood and affect. Her behavior is normal.    FHR: 158 by doppler ED Course  Assessment: IUP at 11.1wks Cat I FT Abdominal Pain  Plan: -Labs: Wet Prep, UA, GC/CT -Physical Exam as above -Discussed exam findings -Tylenol XR for pain  Follow Up (4098) -Wet Prep positive for Clue Cells -Discussed ways to prevent bacterial vaginosis:  +Wear cotton underwear +Use low scent or scent free soaps and detergents +No douching -Discussed pain mgmt at home -Discussed who and when to call for concerns or additional questions -Keep appt as scheduled: 2/26 -Encouraged to call if any questions or concerns arise prior to next scheduled office visit.  -Discharged to home in stable condition  Cherre Robins CNM, MSN 01/04/2018 2:19 AM

## 2018-01-04 NOTE — MAU Note (Signed)
I woke up about an hour ago and was having sharp, knotting pain in lower abd. Denies vag bleeding or d/c. Having normal BMs.

## 2018-01-04 NOTE — Discharge Instructions (Signed)
Bacterial Vaginosis Bacterial vaginosis is a vaginal infection that occurs when the normal balance of bacteria in the vagina is disrupted. It results from an overgrowth of certain bacteria. This is the most common vaginal infection among women ages 15-44. Because bacterial vaginosis increases your risk for STIs (sexually transmitted infections), getting treated can help reduce your risk for chlamydia, gonorrhea, herpes, and HIV (human immunodeficiency virus). Treatment is also important for preventing complications in pregnant women, because this condition can cause an early (premature) delivery. What are the causes? This condition is caused by an increase in harmful bacteria that are normally present in small amounts in the vagina. However, the reason that the condition develops is not fully understood. What increases the risk? The following factors may make you more likely to develop this condition:  Having a new sexual partner or multiple sexual partners.  Having unprotected sex.  Douching.  Having an intrauterine device (IUD).  Smoking.  Drug and alcohol abuse.  Taking certain antibiotic medicines.  Being pregnant.  You cannot get bacterial vaginosis from toilet seats, bedding, swimming pools, or contact with objects around you. What are the signs or symptoms? Symptoms of this condition include:  Grey or white vaginal discharge. The discharge can also be watery or foamy.  A fish-like odor with discharge, especially after sexual intercourse or during menstruation.  Itching in and around the vagina.  Burning or pain with urination.  Some women with bacterial vaginosis have no signs or symptoms. How is this diagnosed? This condition is diagnosed based on:  Your medical history.  A physical exam of the vagina.  Testing a sample of vaginal fluid under a microscope to look for a large amount of bad bacteria or abnormal cells. Your health care provider may use a cotton swab  or a small wooden spatula to collect the sample.  How is this treated? This condition is treated with antibiotics. These may be given as a pill, a vaginal cream, or a medicine that is put into the vagina (suppository). If the condition comes back after treatment, a second round of antibiotics may be needed. Follow these instructions at home: Medicines  Take over-the-counter and prescription medicines only as told by your health care provider.  Take or use your antibiotic as told by your health care provider. Do not stop taking or using the antibiotic even if you start to feel better. General instructions  If you have a female sexual partner, tell her that you have a vaginal infection. She should see her health care provider and be treated if she has symptoms. If you have a female sexual partner, he does not need treatment.  During treatment: ? Avoid sexual activity until you finish treatment. ? Do not douche. ? Avoid alcohol as directed by your health care provider. ? Avoid breastfeeding as directed by your health care provider.  Drink enough water and fluids to keep your urine clear or pale yellow.  Keep the area around your vagina and rectum clean. ? Wash the area daily with warm water. ? Wipe yourself from front to back after using the toilet.  Keep all follow-up visits as told by your health care provider. This is important. How is this prevented?  Do not douche.  Wash the outside of your vagina with warm water only.  Use protection when having sex. This includes latex condoms and dental dams.  Limit how many sexual partners you have. To help prevent bacterial vaginosis, it is best to have sex with just   one partner (monogamous).  Make sure you and your sexual partner are tested for STIs.  Wear cotton or cotton-lined underwear.  Avoid wearing tight pants and pantyhose, especially during summer.  Limit the amount of alcohol that you drink.  Do not use any products that  contain nicotine or tobacco, such as cigarettes and e-cigarettes. If you need help quitting, ask your health care provider.  Do not use illegal drugs. Where to find more information:  Centers for Disease Control and Prevention: www.cdc.gov/std  American Sexual Health Association (ASHA): www.ashastd.org  [place] Department of Health and Human Services, Office on Women's Health: www.womenshealth.gov/ or https://www.womenshealth.gov/a-z-topics/bacterial-vaginosis Contact a health care provider if:  Your symptoms do not improve, even after treatment.  You have more discharge or pain when urinating.  You have a fever.  You have pain in your abdomen.  You have pain during sex.  You have vaginal bleeding between periods. Summary  Bacterial vaginosis is a vaginal infection that occurs when the normal balance of bacteria in the vagina is disrupted.  Because bacterial vaginosis increases your risk for STIs (sexually transmitted infections), getting treated can help reduce your risk for chlamydia, gonorrhea, herpes, and HIV (human immunodeficiency virus). Treatment is also important for preventing complications in pregnant women, because the condition can cause an early (premature) delivery.  This condition is treated with antibiotic medicines. These may be given as a pill, a vaginal cream, or a medicine that is put into the vagina (suppository). This information is not intended to replace advice given to you by your health care provider. Make sure you discuss any questions you have with your health care provider. Document Released: 11/19/2005 Document Revised: 03/25/2017 Document Reviewed: 08/04/2016 Elsevier Interactive Patient Education  2018 Elsevier Inc.  

## 2018-01-04 NOTE — Progress Notes (Signed)
Jessica Emly CNM in to discuss test results and d/c plan. Written and verbal d/c instructions given and understanding voiced. 

## 2018-01-04 NOTE — Progress Notes (Signed)
Gerrit HeckJessica EMly CNM notified of pt's admission and status. Will see pt

## 2018-01-06 LAB — GC/CHLAMYDIA PROBE AMP (~~LOC~~) NOT AT ARMC
CHLAMYDIA, DNA PROBE: NEGATIVE
Neisseria Gonorrhea: NEGATIVE

## 2018-01-28 ENCOUNTER — Inpatient Hospital Stay (HOSPITAL_COMMUNITY)
Admission: AD | Admit: 2018-01-28 | Discharge: 2018-01-28 | Disposition: A | Discharge status: Home / Self Care | Payer: Medicaid Other | Source: Ambulatory Visit | Attending: Obstetrics and Gynecology | Admitting: Obstetrics and Gynecology

## 2018-01-28 ENCOUNTER — Encounter (HOSPITAL_COMMUNITY): Payer: Self-pay | Admitting: *Deleted

## 2018-01-28 ENCOUNTER — Other Ambulatory Visit: Payer: Self-pay

## 2018-01-28 DIAGNOSIS — Z8619 Personal history of other infectious and parasitic diseases: Secondary | ICD-10-CM | POA: Diagnosis not present

## 2018-01-28 DIAGNOSIS — F17210 Nicotine dependence, cigarettes, uncomplicated: Secondary | ICD-10-CM | POA: Diagnosis not present

## 2018-01-28 DIAGNOSIS — O09892 Supervision of other high risk pregnancies, second trimester: Secondary | ICD-10-CM | POA: Insufficient documentation

## 2018-01-28 DIAGNOSIS — O99332 Smoking (tobacco) complicating pregnancy, second trimester: Secondary | ICD-10-CM | POA: Insufficient documentation

## 2018-01-28 DIAGNOSIS — Z3A14 14 weeks gestation of pregnancy: Secondary | ICD-10-CM | POA: Diagnosis present

## 2018-01-28 DIAGNOSIS — O26892 Other specified pregnancy related conditions, second trimester: Secondary | ICD-10-CM | POA: Diagnosis present

## 2018-01-28 DIAGNOSIS — R51 Headache: Secondary | ICD-10-CM | POA: Diagnosis present

## 2018-01-28 LAB — URINALYSIS, ROUTINE W REFLEX MICROSCOPIC
Bilirubin Urine: NEGATIVE
GLUCOSE, UA: NEGATIVE mg/dL
HGB URINE DIPSTICK: NEGATIVE
Ketones, ur: NEGATIVE mg/dL
Leukocytes, UA: NEGATIVE
Nitrite: NEGATIVE
PH: 8 (ref 5.0–8.0)
Protein, ur: NEGATIVE mg/dL
SPECIFIC GRAVITY, URINE: 1.016 (ref 1.005–1.030)

## 2018-01-28 MED ORDER — METOCLOPRAMIDE HCL 5 MG/ML IJ SOLN
10.0000 mg | Freq: Once | INTRAMUSCULAR | Status: AC
  Administered 2018-01-28: 10 mg via INTRAVENOUS

## 2018-01-28 MED ORDER — DIPHENHYDRAMINE HCL 50 MG/ML IJ SOLN
25.0000 mg | Freq: Once | INTRAMUSCULAR | Status: AC
  Administered 2018-01-28: 25 mg via INTRAVENOUS
  Filled 2018-01-28: qty 1

## 2018-01-28 MED ORDER — DEXAMETHASONE SODIUM PHOSPHATE 10 MG/ML IJ SOLN
10.0000 mg | Freq: Once | INTRAMUSCULAR | Status: AC
  Administered 2018-01-28: 10 mg via INTRAVENOUS
  Filled 2018-01-28: qty 1

## 2018-01-28 MED ORDER — LACTATED RINGERS IV BOLUS (SEPSIS)
1000.0000 mL | Freq: Once | INTRAVENOUS | Status: AC
  Administered 2018-01-28: 1000 mL via INTRAVENOUS

## 2018-01-28 NOTE — MAU Provider Note (Signed)
Chief Complaint: Headache   None    SUBJECTIVE HPI: Heather Lane is a 27 y.o. G2P1001 at [redacted]w[redacted]d who presents to Maternity Admissions reporting a headache.  States started yesterday during day.  Took 1000 mg Tylenol without relief.  Denies hx of Migraines.  States child has strep.    Location: headache Quality: throbbing Severity: 7/10 on pain scale Duration: 8 hours Modifying factors: None Associated signs and symptoms: None  Past Medical History:  Diagnosis Date  . Chlamydia trachomatis infection of genitourinary sites 2009  . Depression    prior taking medications for PP depression  . Hepatitis C antibody test positive    this is written in admission paperwork in 2012.  needs to be confirmed with blood tests.   Marland Kitchen HSV-1 (herpes simplex virus 1) infection   . PID (pelvic inflammatory disease)    OB History  Gravida Para Term Preterm AB Living  2 1 1     1   SAB TAB Ectopic Multiple Live Births          1    # Outcome Date GA Lbr Len/2nd Weight Sex Delivery Anes PTL Lv  2 Current           1 Term 05/19/11 [redacted]w[redacted]d  3.062 kg (6 lb 12 oz) F CS-LTranv EPI N LIV     Past Surgical History:  Procedure Laterality Date  . CESAREAN SECTION  05/2011   Arrest of dilation   Social History   Socioeconomic History  . Marital status: Single    Spouse name: Not on file  . Number of children: Not on file  . Years of education: Not on file  . Highest education level: Not on file  Social Needs  . Financial resource strain: Not on file  . Food insecurity - worry: Not on file  . Food insecurity - inability: Not on file  . Transportation needs - medical: Not on file  . Transportation needs - non-medical: Not on file  Occupational History  . Not on file  Tobacco Use  . Smoking status: Current Some Day Smoker    Packs/day: 1.00    Types: Cigarettes    Last attempt to quit: 03/15/2014    Years since quitting: 3.8  . Smokeless tobacco: Never Used  Substance and Sexual Activity  .  Alcohol use: Yes    Alcohol/week: 1.8 oz    Types: 3 Glasses of wine per week    Comment: last use dec 2018  . Drug use: Yes    Types: Marijuana    Comment: last use 14 Jan 2018  . Sexual activity: Yes    Partners: Male    Birth control/protection: None  Other Topics Concern  . Not on file  Social History Narrative  . Not on file   Family History  Problem Relation Age of Onset  . Diabetes Sister   . Diabetes Maternal Aunt   . Hypertension Maternal Aunt   . Diabetes Maternal Uncle   . Hypertension Maternal Uncle    No current facility-administered medications on file prior to encounter.    Current Outpatient Medications on File Prior to Encounter  Medication Sig Dispense Refill  . metroNIDAZOLE (METROGEL VAGINAL) 0.75 % vaginal gel Place 1 Applicatorful vaginally at bedtime. Insert one applicator, at bedtime, for 5 nights. 70 g 0  . Prenatal Multivit-Min-Fe-FA (PRENATAL VITAMINS) 0.8 MG tablet Take 1 tablet by mouth daily. 30 tablet 3   Allergies  Allergen Reactions  . Pollen Extract Itching  I have reviewed patient's Past Medical Hx, Surgical Hx, Family Hx, Social Hx, medications and allergies.   Review of Systems  Constitutional: Negative.   HENT: Negative.   Eyes: Negative.   Respiratory: Negative.   Cardiovascular: Negative.   Gastrointestinal: Negative.   Endocrine: Negative.   Genitourinary: Negative.   Musculoskeletal: Negative.   Allergic/Immunologic: Negative.   Neurological: Positive for headaches.  Hematological: Negative.   Psychiatric/Behavioral: Negative.     OBJECTIVE Patient Vitals for the past 24 hrs:  BP Temp Temp src Pulse Resp Height Weight  01/28/18 0020 (!) 111/58 98.1 F (36.7 C) Oral 78 16 5\' 4"  (1.626 m) 60.8 kg (134 lb)   Constitutional: Well-developed, well-nourished female in no acute distress.  Cardiovascular: normal rate Respiratory: normal rate and effort.  GI: Abd soft, non-tender, gravid appropriate for gestational age.  MS: Extremities nontender, no edema, normal ROM Neurologic: Alert and oriented x 4.  GU: LAB RESULTS Results for orders placed or performed during the hospital encounter of 01/28/18 (from the past 24 hour(s))  Urinalysis, Routine w reflex microscopic     Status: Abnormal   Collection Time: 01/28/18 12:15 AM  Result Value Ref Range   Color, Urine STRAW (A) YELLOW   APPearance CLEAR CLEAR   Specific Gravity, Urine 1.016 1.005 - 1.030   pH 8.0 5.0 - 8.0   Glucose, UA NEGATIVE NEGATIVE mg/dL   Hgb urine dipstick NEGATIVE NEGATIVE   Bilirubin Urine NEGATIVE NEGATIVE   Ketones, ur NEGATIVE NEGATIVE mg/dL   Protein, ur NEGATIVE NEGATIVE mg/dL   Nitrite NEGATIVE NEGATIVE   Leukocytes, UA NEGATIVE NEGATIVE    IMAGING No results found.  MAU COURSE Orders Placed This Encounter  Procedures  . Urinalysis, Routine w reflex microscopic   Meds ordered this encounter  Medications  . lactated ringers bolus 1,000 mL  . diphenhydrAMINE (BENADRYL) injection 25 mg  . metoCLOPramide (REGLAN) injection 10 mg  . dexamethasone (DECADRON) injection 10 mg    MDM PE IV fluids and medication After fluids and headache cocktail headache was gone. ASSESSMENT Headache in pregnancy  PLAN Discharge home in stable condition.  Encourage hydration.  Keep appt tomorrow at office.      Heather Lane, Heather Lane, CNM 01/28/2018  1:48 AM

## 2018-01-28 NOTE — MAU Note (Signed)
Pt woke up with a headache this morning, she drank water and slept to try to rid the h/a.   About 2230, she took 1gm tylenol with no relief. Headache worsened.

## 2018-02-02 ENCOUNTER — Encounter: Payer: Self-pay | Admitting: Obstetrics and Gynecology

## 2018-03-05 ENCOUNTER — Other Ambulatory Visit (HOSPITAL_COMMUNITY): Payer: Self-pay | Admitting: Obstetrics and Gynecology

## 2018-03-05 DIAGNOSIS — Z3689 Encounter for other specified antenatal screening: Secondary | ICD-10-CM

## 2018-03-10 ENCOUNTER — Other Ambulatory Visit (HOSPITAL_COMMUNITY): Payer: Self-pay | Admitting: Obstetrics and Gynecology

## 2018-03-10 ENCOUNTER — Ambulatory Visit (HOSPITAL_COMMUNITY)
Admission: RE | Admit: 2018-03-10 | Discharge: 2018-03-10 | Disposition: A | Discharge status: Home / Self Care | Payer: Medicaid Other | Source: Ambulatory Visit | Attending: Obstetrics and Gynecology | Admitting: Obstetrics and Gynecology

## 2018-03-10 DIAGNOSIS — Z3A20 20 weeks gestation of pregnancy: Secondary | ICD-10-CM | POA: Insufficient documentation

## 2018-03-10 DIAGNOSIS — O99332 Smoking (tobacco) complicating pregnancy, second trimester: Secondary | ICD-10-CM | POA: Diagnosis present

## 2018-03-10 DIAGNOSIS — O34219 Maternal care for unspecified type scar from previous cesarean delivery: Secondary | ICD-10-CM | POA: Diagnosis not present

## 2018-03-10 DIAGNOSIS — Z3689 Encounter for other specified antenatal screening: Secondary | ICD-10-CM

## 2018-03-10 DIAGNOSIS — Z363 Encounter for antenatal screening for malformations: Secondary | ICD-10-CM | POA: Insufficient documentation

## 2018-03-15 ENCOUNTER — Other Ambulatory Visit: Payer: Self-pay

## 2018-03-15 ENCOUNTER — Encounter (HOSPITAL_COMMUNITY): Payer: Self-pay | Admitting: *Deleted

## 2018-03-15 ENCOUNTER — Inpatient Hospital Stay (HOSPITAL_COMMUNITY)
Admission: AD | Admit: 2018-03-15 | Discharge: 2018-03-15 | Disposition: A | Discharge status: Home / Self Care | Payer: Medicaid Other | Source: Ambulatory Visit | Attending: Obstetrics & Gynecology | Admitting: Obstetrics & Gynecology

## 2018-03-15 DIAGNOSIS — R109 Unspecified abdominal pain: Secondary | ICD-10-CM | POA: Diagnosis not present

## 2018-03-15 DIAGNOSIS — Z3A21 21 weeks gestation of pregnancy: Secondary | ICD-10-CM | POA: Insufficient documentation

## 2018-03-15 DIAGNOSIS — O26892 Other specified pregnancy related conditions, second trimester: Secondary | ICD-10-CM | POA: Diagnosis not present

## 2018-03-15 DIAGNOSIS — Z87891 Personal history of nicotine dependence: Secondary | ICD-10-CM | POA: Insufficient documentation

## 2018-03-15 LAB — URINALYSIS, ROUTINE W REFLEX MICROSCOPIC
Bilirubin Urine: NEGATIVE
Glucose, UA: NEGATIVE mg/dL
HGB URINE DIPSTICK: NEGATIVE
Ketones, ur: NEGATIVE mg/dL
Leukocytes, UA: NEGATIVE
Nitrite: NEGATIVE
PROTEIN: NEGATIVE mg/dL
SPECIFIC GRAVITY, URINE: 1.011 (ref 1.005–1.030)
pH: 8 (ref 5.0–8.0)

## 2018-03-15 NOTE — Discharge Instructions (Signed)

## 2018-03-15 NOTE — MAU Note (Signed)
Sharp pains in lower abd when she stands.concerned because the baby was so low when they did her last US, scares her

## 2018-03-15 NOTE — MAU Provider Note (Signed)
History   G2P1001 @ 21.1 wks in with c/o sharp shooting abd pains that started this morning. Denies vag bleeding or ROM.  CSN: [id number]  Arrival date & time 03/15/18  1050   None     Chief Complaint  Patient presents with  . Abdominal Pain    HPI  Past Medical History:  Diagnosis Date  . Chlamydia trachomatis infection of genitourinary sites 2009  . Depression    prior taking medications for PP depression  . HSV-1 (herpes simplex virus 1) infection   . PID (pelvic inflammatory disease)     Past Surgical History:  Procedure Laterality Date  . CESAREAN SECTION  05/2011   Arrest of dilation    Family History  Problem Relation Age of Onset  . Diabetes Sister   . Diabetes Maternal Aunt   . Hypertension Maternal Aunt   . Diabetes Maternal Uncle   . Hypertension Maternal Uncle     Social History   Tobacco Use  . Smoking status: Former Smoker    Packs/day: 1.00    Types: Cigarettes    Last attempt to quit: 03/15/2014    Years since quitting: 4.0  . Smokeless tobacco: Never Used  Substance Use Topics  . Alcohol use: Yes    Alcohol/week: 1.8 oz    Types: 3 Glasses of wine per week    Comment: last use dec 2018  . Drug use: Not Currently    Types: Marijuana    Comment: denies    OB History    Gravida  2   Para  1   Term  1   Preterm      AB      Living  1     SAB      TAB      Ectopic      Multiple      Live Births  1           Review of Systems  Constitutional: Negative.   HENT: Negative.   Eyes: Negative.   Respiratory: Negative.   Cardiovascular: Negative.   Gastrointestinal: Positive for abdominal pain.  Endocrine: Negative.   Genitourinary: Negative.   Musculoskeletal: Negative.   Skin: Negative.   Allergic/Immunologic: Negative.   Neurological: Negative.   Hematological: Negative.   Psychiatric/Behavioral: Negative.     Allergies  Pollen extract  Home Medications    BP 106/60 (BP Location: Right Arm)   Pulse (!)  101   Temp 98.7 F (37.1 C) (Oral)   Resp 17   Wt 148 lb 8 oz (67.4 kg)   LMP 10/18/2017 (Exact Date)   SpO2 99%   BMI 25.49 kg/m   Physical Exam  Constitutional: She is oriented to person, place, and time. She appears well-developed and well-nourished.  HENT:  Head: Normocephalic.  Eyes: Pupils are equal, round, and reactive to light.  Neck: Normal range of motion.  Cardiovascular: Normal rate, regular rhythm, normal heart sounds and intact distal pulses.  Pulmonary/Chest: Effort normal and breath sounds normal.  Abdominal: Soft. Bowel sounds are normal.  Genitourinary: Vagina normal and uterus normal.  Musculoskeletal: Normal range of motion.  Neurological: She is alert and oriented to person, place, and time. She has normal reflexes.  Skin: Skin is warm and dry.  Psychiatric: She has a normal mood and affect. Her behavior is normal. Judgment and thought content normal.    MAU Course  Procedures (including critical care time)  Labs Reviewed  URINALYSIS, ROUTINE W REFLEX MICROSCOPIC  No results found.   1. Abdominal pain in pregnancy, second trimester       MDM  FHR st and reg by doppler. SVE cl/th/post/high. VSS. Attempted to get in touch with Dr. Sallye OberKulwa x 2 no answer. U/A normal will d/c home

## 2018-03-25 ENCOUNTER — Other Ambulatory Visit (HOSPITAL_COMMUNITY): Payer: Self-pay | Admitting: Obstetrics & Gynecology

## 2018-03-25 DIAGNOSIS — Z3A23 23 weeks gestation of pregnancy: Secondary | ICD-10-CM

## 2018-03-25 DIAGNOSIS — Z362 Encounter for other antenatal screening follow-up: Secondary | ICD-10-CM

## 2018-04-01 ENCOUNTER — Encounter (HOSPITAL_COMMUNITY): Payer: Self-pay

## 2018-04-01 ENCOUNTER — Other Ambulatory Visit (HOSPITAL_COMMUNITY): Payer: Self-pay | Admitting: Obstetrics & Gynecology

## 2018-04-01 ENCOUNTER — Ambulatory Visit (HOSPITAL_COMMUNITY)
Admission: RE | Admit: 2018-04-01 | Discharge: 2018-04-01 | Disposition: A | Discharge status: Home / Self Care | Payer: Medicaid Other | Source: Ambulatory Visit | Attending: Obstetrics & Gynecology | Admitting: Obstetrics & Gynecology

## 2018-04-01 DIAGNOSIS — Z362 Encounter for other antenatal screening follow-up: Secondary | ICD-10-CM | POA: Insufficient documentation

## 2018-04-01 DIAGNOSIS — IMO0002 Reserved for concepts with insufficient information to code with codable children: Secondary | ICD-10-CM

## 2018-04-01 DIAGNOSIS — Z3A23 23 weeks gestation of pregnancy: Secondary | ICD-10-CM | POA: Insufficient documentation

## 2018-04-01 DIAGNOSIS — O34219 Maternal care for unspecified type scar from previous cesarean delivery: Secondary | ICD-10-CM | POA: Diagnosis not present

## 2018-04-01 DIAGNOSIS — O99332 Smoking (tobacco) complicating pregnancy, second trimester: Secondary | ICD-10-CM

## 2018-04-01 DIAGNOSIS — Z0489 Encounter for examination and observation for other specified reasons: Secondary | ICD-10-CM

## 2018-04-01 NOTE — Addendum Note (Signed)
Encounter addended by: Shannan Slinker, RDMS on: 04/01/2018 4:27 PM  Actions taken: Imaging Exam ended

## 2018-06-18 ENCOUNTER — Encounter: Payer: Medicaid Other | Attending: Obstetrics and Gynecology | Admitting: *Deleted

## 2018-06-18 DIAGNOSIS — O99810 Abnormal glucose complicating pregnancy: Secondary | ICD-10-CM | POA: Insufficient documentation

## 2018-06-18 DIAGNOSIS — Z713 Dietary counseling and surveillance: Secondary | ICD-10-CM | POA: Diagnosis not present

## 2018-06-18 DIAGNOSIS — R7302 Impaired glucose tolerance (oral): Secondary | ICD-10-CM

## 2018-06-18 NOTE — Progress Notes (Signed)
  Patient was seen on 06/18/2018 for Gestational Diabetes self-management. EDD 07/25/2018. Patient states no history of GDM. Diet history obtained. Patient eats good variety of all food groups but in larger portions than approprieateand beverages include mostly sweetened drinks.  The following learning objectives were met by the patient :   States the definition of Gestational Diabetes  States why dietary management is important in controlling blood glucose  Describes the effects of carbohydrates on blood glucose levels  Demonstrates ability to create a balanced meal plan  Demonstrates carbohydrate counting   States when to check blood glucose levels  Demonstrates proper blood glucose monitoring techniques  States the effect of stress and exercise on blood glucose levels  States the importance of limiting caffeine and abstaining from alcohol and smoking  Plan:  Aim for 3 Carb Choices per meal (45 grams) +/- 1 either way  Aim for 1-2 Carbs per snack Begin reading food labels for Total Carbohydrate of foods Consider  increasing your activity level by walking or other activity daily as tolerated Begin checking BG before breakfast and 2 hours after first bite of breakfast, lunch and dinner as directed by MD  Bring Log Book/Sheet to every medical appointment   Take medication if directed by MD  Patient instructed to contact MD office so blood glucose monitor Rx can be called into pharmacy: Accu Check Guide with Fast Clix drums is covered by Medicaid Patient instructed to test pre breakfast and 2 hours each meal as directed by MD  Patient instructed to monitor glucose levels: FBS: 60 - 95 mg/dl 2 hour: <120 mg/dl  Patient received the following handouts:  Nutrition Diabetes and Pregnancy  Carbohydrate Counting List  Patient will be seen for follow-up as needed.

## 2018-07-08 ENCOUNTER — Telehealth (HOSPITAL_COMMUNITY): Payer: Self-pay | Admitting: *Deleted

## 2018-07-08 ENCOUNTER — Encounter (HOSPITAL_COMMUNITY): Payer: Self-pay

## 2018-07-08 NOTE — Telephone Encounter (Signed)
Preadmission screen  

## 2018-07-09 ENCOUNTER — Telehealth (HOSPITAL_COMMUNITY): Payer: Self-pay | Admitting: *Deleted

## 2018-07-09 NOTE — Telephone Encounter (Signed)
Preadmission screen  

## 2018-07-10 ENCOUNTER — Telehealth (HOSPITAL_COMMUNITY): Payer: Self-pay | Admitting: *Deleted

## 2018-07-10 ENCOUNTER — Other Ambulatory Visit: Payer: Self-pay | Admitting: Obstetrics & Gynecology

## 2018-07-10 NOTE — Telephone Encounter (Signed)
Preadmission screen  

## 2018-07-11 ENCOUNTER — Encounter (HOSPITAL_COMMUNITY): Payer: Self-pay

## 2018-07-16 ENCOUNTER — Encounter (HOSPITAL_COMMUNITY): Payer: Self-pay

## 2018-07-16 ENCOUNTER — Inpatient Hospital Stay (HOSPITAL_COMMUNITY)
Admission: AD | Admit: 2018-07-16 | Discharge: 2018-07-16 | Disposition: A | Discharge status: Home / Self Care | Payer: Medicaid Other | Source: Ambulatory Visit | Attending: Obstetrics & Gynecology | Admitting: Obstetrics & Gynecology

## 2018-07-16 ENCOUNTER — Other Ambulatory Visit: Payer: Self-pay

## 2018-07-16 DIAGNOSIS — O471 False labor at or after 37 completed weeks of gestation: Secondary | ICD-10-CM | POA: Insufficient documentation

## 2018-07-16 DIAGNOSIS — O479 False labor, unspecified: Secondary | ICD-10-CM

## 2018-07-16 DIAGNOSIS — Z3A39 39 weeks gestation of pregnancy: Secondary | ICD-10-CM | POA: Diagnosis not present

## 2018-07-16 LAB — AMNISURE RUPTURE OF MEMBRANE (ROM) NOT AT ARMC: Amnisure ROM: NEGATIVE

## 2018-07-16 LAB — POCT FERN TEST: POCT Fern Test: NEGATIVE

## 2018-07-16 NOTE — MAU Note (Signed)
Having contractions and feels like she is peeing on herself or her water broke.  Small amts of clear fluid keep coming, first noted around 1715. Ft at last visit.

## 2018-07-16 NOTE — MAU Note (Signed)
I have communicated with L. Clemmons, CNM and reviewed vital signs:  Vitals:   07/16/18 2221 07/16/18 2226  BP: 120/70 120/70  Pulse: 100   Resp:    Temp:    SpO2:      Vaginal exam:  Dilation: Closed Effacement (%): Thick Cervical Position: Posterior Station: Ballotable Exam by:: TLytle RN ,   Also reviewed contraction pattern and that non-stress test is reactive.  It has been documented that patient is contracting every 1-7 minutes with no cervical change over 3.5  hours not indicating active labor.  Patient denies any other complaints.  Based on this report provider has given order for discharge.  A discharge order and diagnosis entered by a provider.   Labor discharge instructions reviewed with patient.

## 2018-07-16 NOTE — Discharge Instructions (Signed)
Braxton Hicks Contractions °Contractions of the uterus can occur throughout pregnancy, but they are not always a sign that you are in labor. You may have practice contractions called Braxton Hicks contractions. These false labor contractions are sometimes confused with true labor. °What are Braxton Hicks contractions? °Braxton Hicks contractions are tightening movements that occur in the muscles of the uterus before labor. Unlike true labor contractions, these contractions do not result in opening (dilation) and thinning of the cervix. Toward the end of pregnancy (32-34 weeks), Braxton Hicks contractions can happen more often and may become stronger. These contractions are sometimes difficult to tell apart from true labor because they can be very uncomfortable. You should not feel embarrassed if you go to the hospital with false labor. °Sometimes, the only way to tell if you are in true labor is for your health care provider to look for changes in the cervix. The health care provider will do a physical exam and may monitor your contractions. If you are not in true labor, the exam should show that your cervix is not dilating and your water has not broken. °If there are other health problems associated with your pregnancy, it is completely safe for you to be sent home with false labor. You may continue to have Braxton Hicks contractions until you go into true labor. °How to tell the difference between true labor and false labor °True labor °· Contractions last 30-70 seconds. °· Contractions become very regular. °· Discomfort is usually felt in the top of the uterus, and it spreads to the lower abdomen and low back. °· Contractions do not go away with walking. °· Contractions usually become more intense and increase in frequency. °· The cervix dilates and gets thinner. °False labor °· Contractions are usually shorter and not as strong as true labor contractions. °· Contractions are usually irregular. °· Contractions  are often felt in the front of the lower abdomen and in the groin. °· Contractions may go away when you walk around or change positions while lying down. °· Contractions get weaker and are shorter-lasting as time goes on. °· The cervix usually does not dilate or become thin. °Follow these instructions at home: °· Take over-the-counter and prescription medicines only as told by your health care provider. °· Keep up with your usual exercises and follow other instructions from your health care provider. °· Eat and drink lightly if you think you are going into labor. °· If Braxton Hicks contractions are making you uncomfortable: °? Change your position from lying down or resting to walking, or change from walking to resting. °? Sit and rest in a tub of warm water. °? Drink enough fluid to keep your urine pale yellow. Dehydration may cause these contractions. °? Do slow and deep breathing several times an hour. °· Keep all follow-up prenatal visits as told by your health care provider. This is important. °Contact a health care provider if: °· You have a fever. °· You have continuous pain in your abdomen. °Get help right away if: °· Your contractions become stronger, more regular, and closer together. °· You have fluid leaking or gushing from your vagina. °· You pass blood-tinged mucus (bloody show). °· You have bleeding from your vagina. °· You have low back pain that you never had before. °· You feel your baby’s head pushing down and causing pelvic pressure. °· Your baby is not moving inside you as much as it used to. °Summary °· Contractions that occur before labor are called Braxton   Hicks contractions, false labor, or practice contractions. °· Braxton Hicks contractions are usually shorter, weaker, farther apart, and less regular than true labor contractions. True labor contractions usually become progressively stronger and regular and they become more frequent. °· Manage discomfort from Braxton Hicks contractions by  changing position, resting in a warm bath, drinking plenty of water, or practicing deep breathing. °This information is not intended to replace advice given to you by your health care provider. Make sure you discuss any questions you have with your health care provider. °Document Released: 04/04/2017 Document Revised: 04/04/2017 Document Reviewed: 04/04/2017 °Elsevier Interactive Patient Education © 2018 Elsevier Inc. ° °

## 2018-07-17 ENCOUNTER — Encounter (HOSPITAL_COMMUNITY)
Admission: RE | Admit: 2018-07-17 | Discharge: 2018-07-17 | Disposition: A | Discharge status: Home / Self Care | Payer: Medicaid Other | Source: Ambulatory Visit | Attending: Obstetrics & Gynecology | Admitting: Obstetrics & Gynecology

## 2018-07-17 HISTORY — DX: Gestational diabetes mellitus in pregnancy, unspecified control: O24.419

## 2018-07-17 HISTORY — DX: Elevated white blood cell count, unspecified: D72.829

## 2018-07-17 LAB — COMPREHENSIVE METABOLIC PANEL
ALBUMIN: 3.2 g/dL — AB (ref 3.5–5.0)
ALK PHOS: 100 U/L (ref 38–126)
ALT: 13 U/L (ref 0–44)
AST: 16 U/L (ref 15–41)
Anion gap: 11 (ref 5–15)
BUN: 14 mg/dL (ref 6–20)
CHLORIDE: 103 mmol/L (ref 98–111)
CO2: 21 mmol/L — AB (ref 22–32)
CREATININE: 0.65 mg/dL (ref 0.44–1.00)
Calcium: 9 mg/dL (ref 8.9–10.3)
GFR calc Af Amer: >60 mL/min (ref >60)
GFR calc non Af Amer: >60 mL/min (ref >60)
GLUCOSE: 92 mg/dL (ref 70–99)
Potassium: 4 mmol/L (ref 3.5–5.1)
SODIUM: 135 mmol/L (ref 135–145)
Total Bilirubin: 0.9 mg/dL (ref 0.3–1.2)
Total Protein: 6.3 g/dL — ABNORMAL LOW (ref 6.5–8.1)

## 2018-07-17 LAB — TYPE AND SCREEN
ABO/RH(D): AB POS
Antibody Screen: NEGATIVE

## 2018-07-17 LAB — CBC
HEMATOCRIT: 33 % — AB (ref 36.0–46.0)
Hemoglobin: 12.2 g/dL (ref 12.0–15.0)
MCH: 30.8 pg (ref 26.0–34.0)
MCHC: 37 g/dL — ABNORMAL HIGH (ref 30.0–36.0)
MCV: 83.3 fL (ref 78.0–100.0)
PLATELETS: 239 10*3/uL (ref 150–400)
RBC: 3.96 MIL/uL (ref 3.87–5.11)
RDW: 14.7 % (ref 11.5–15.5)
WBC: 11.8 10*3/uL — ABNORMAL HIGH (ref 4.0–10.5)

## 2018-07-17 LAB — ABO/RH: ABO/RH(D): AB POS

## 2018-07-17 NOTE — Patient Instructions (Signed)
Heather PentonLachelle Lane  07/17/2018   Your procedure is scheduled on:  07/18/2018  [facility]  Pick up the phone at the desk and dial [phone]  Call this number if you have problems the morning of surgery:[phone]  Remember:   Do not eat food:(After Midnight) Desps de medianoche.  Do not drink clear liquids: (After Midnight) Desps de medianoche.  Take these medicines the morning of surgery with A SIP OF WATER: DO NOT TAKE YOUR GLYBURIDE OR ANY OTHER MEDICINE THE DAY OF SURGERY   Do not wear jewelry, make-up or nail polish.  Do not wear lotions, powders, or perfumes. Do not wear deodorant.  Do not shave 48 hours prior to surgery.  Do not bring valuables to the hospital.  [facility] is not   responsible for any belongings or valuables brought to the hospital.  Contacts, dentures or bridgework may not be worn into surgery.  Leave suitcase in the car. After surgery it may be brought to your room.  For patients admitted to the hospital, checkout time is 11:00 AM the day of              discharge.    N/A   Please read over the following fact sheets that you were given:   Surgical Site Infection Prevention

## 2018-07-18 ENCOUNTER — Encounter (HOSPITAL_COMMUNITY): Payer: Self-pay | Admitting: *Deleted

## 2018-07-18 ENCOUNTER — Encounter (HOSPITAL_COMMUNITY)
Admission: RE | Disposition: A | Discharge status: Home / Self Care | Payer: Self-pay | Source: Home / Self Care | Attending: Obstetrics & Gynecology

## 2018-07-18 ENCOUNTER — Inpatient Hospital Stay (HOSPITAL_COMMUNITY)
Admission: RE | Admit: 2018-07-18 | Discharge: 2018-07-20 | DRG: 787 | Disposition: A | Discharge status: Home / Self Care | Payer: Medicaid Other | Attending: Obstetrics & Gynecology | Admitting: Obstetrics & Gynecology

## 2018-07-18 ENCOUNTER — Inpatient Hospital Stay (HOSPITAL_COMMUNITY): Payer: Medicaid Other | Admitting: Anesthesiology

## 2018-07-18 DIAGNOSIS — O9902 Anemia complicating childbirth: Secondary | ICD-10-CM | POA: Diagnosis present

## 2018-07-18 DIAGNOSIS — O34211 Maternal care for low transverse scar from previous cesarean delivery: Principal | ICD-10-CM | POA: Diagnosis present

## 2018-07-18 DIAGNOSIS — Z87891 Personal history of nicotine dependence: Secondary | ICD-10-CM

## 2018-07-18 DIAGNOSIS — D573 Sickle-cell trait: Secondary | ICD-10-CM | POA: Diagnosis present

## 2018-07-18 DIAGNOSIS — O24415 Gestational diabetes mellitus in pregnancy, controlled by oral hypoglycemic drugs: Secondary | ICD-10-CM | POA: Diagnosis present

## 2018-07-18 DIAGNOSIS — O24425 Gestational diabetes mellitus in childbirth, controlled by oral hypoglycemic drugs: Secondary | ICD-10-CM | POA: Diagnosis present

## 2018-07-18 DIAGNOSIS — O9832 Other infections with a predominantly sexual mode of transmission complicating childbirth: Secondary | ICD-10-CM | POA: Diagnosis present

## 2018-07-18 DIAGNOSIS — Z98891 History of uterine scar from previous surgery: Secondary | ICD-10-CM

## 2018-07-18 DIAGNOSIS — A6000 Herpesviral infection of urogenital system, unspecified: Secondary | ICD-10-CM | POA: Diagnosis present

## 2018-07-18 DIAGNOSIS — Z3A39 39 weeks gestation of pregnancy: Secondary | ICD-10-CM

## 2018-07-18 DIAGNOSIS — O34219 Maternal care for unspecified type scar from previous cesarean delivery: Secondary | ICD-10-CM

## 2018-07-18 LAB — RPR: RPR: NONREACTIVE

## 2018-07-18 LAB — GLUCOSE, CAPILLARY: GLUCOSE-CAPILLARY: 93 mg/dL (ref 70–99)

## 2018-07-18 SURGERY — Surgical Case
Anesthesia: Spinal

## 2018-07-18 MED ORDER — FENTANYL CITRATE (PF) 100 MCG/2ML IJ SOLN
INTRAMUSCULAR | Status: DC | PRN
  Administered 2018-07-18: 15 ug via INTRATHECAL

## 2018-07-18 MED ORDER — COCONUT OIL OIL
1.0000 "application " | TOPICAL_OIL | Status: DC | PRN
  Filled 2018-07-18: qty 120

## 2018-07-18 MED ORDER — DEXAMETHASONE SODIUM PHOSPHATE 4 MG/ML IJ SOLN
INTRAMUSCULAR | Status: DC | PRN
  Administered 2018-07-18: 4 mg via INTRAVENOUS

## 2018-07-18 MED ORDER — SCOPOLAMINE 1 MG/3DAYS TD PT72
MEDICATED_PATCH | TRANSDERMAL | Status: DC | PRN
  Administered 2018-07-18: 1 via TRANSDERMAL

## 2018-07-18 MED ORDER — CEFAZOLIN SODIUM-DEXTROSE 2-4 GM/100ML-% IV SOLN
2.0000 g | INTRAVENOUS | Status: AC
  Administered 2018-07-18: 2 g via INTRAVENOUS
  Filled 2018-07-18: qty 100

## 2018-07-18 MED ORDER — ONDANSETRON HCL 4 MG/2ML IJ SOLN
INTRAMUSCULAR | Status: AC
  Filled 2018-07-18: qty 2

## 2018-07-18 MED ORDER — SODIUM CHLORIDE 0.9 % IV SOLN: INTRAVENOUS | Status: DC

## 2018-07-18 MED ORDER — DIPHENHYDRAMINE HCL 25 MG PO CAPS
25.0000 mg | ORAL_CAPSULE | Freq: Four times a day (QID) | ORAL | Status: DC | PRN
Start: 2018-07-18 — End: 2018-07-20

## 2018-07-18 MED ORDER — LIDOCAINE HCL (PF) 1 % IJ SOLN
INTRAMUSCULAR | Status: AC
  Filled 2018-07-18: qty 5

## 2018-07-18 MED ORDER — LACTATED RINGERS IV SOLN
INTRAVENOUS | Status: DC | PRN
  Administered 2018-07-18: 14:00:00 via INTRAVENOUS

## 2018-07-18 MED ORDER — LACTATED RINGERS IV SOLN
INTRAVENOUS | Status: DC | PRN
  Administered 2018-07-18 (×3): via INTRAVENOUS

## 2018-07-18 MED ORDER — FENTANYL CITRATE (PF) 100 MCG/2ML IJ SOLN
INTRAMUSCULAR | Status: AC
  Filled 2018-07-18: qty 2

## 2018-07-18 MED ORDER — MORPHINE SULFATE (PF) 0.5 MG/ML IJ SOLN
INTRAMUSCULAR | Status: DC | PRN
  Administered 2018-07-18: .15 mg via INTRATHECAL

## 2018-07-18 MED ORDER — ACETAMINOPHEN 325 MG PO TABS: 650.0000 mg | ORAL_TABLET | ORAL | Status: DC | PRN

## 2018-07-18 MED ORDER — PROPOFOL 10 MG/ML IV BOLUS
INTRAVENOUS | Status: AC
  Filled 2018-07-18: qty 60

## 2018-07-18 MED ORDER — SENNOSIDES-DOCUSATE SODIUM 8.6-50 MG PO TABS
2.0000 | ORAL_TABLET | ORAL | Status: DC
  Administered 2018-07-18 – 2018-07-20 (×2): 2 via ORAL
  Filled 2018-07-18 (×2): qty 2

## 2018-07-18 MED ORDER — SIMETHICONE 80 MG PO CHEW
80.0000 mg | CHEWABLE_TABLET | Freq: Three times a day (TID) | ORAL | Status: DC
  Administered 2018-07-19 – 2018-07-20 (×3): 80 mg via ORAL
  Filled 2018-07-18 (×4): qty 1

## 2018-07-18 MED ORDER — SODIUM CHLORIDE 0.9 % IR SOLN
Status: DC | PRN
  Administered 2018-07-18: 1000 mL

## 2018-07-18 MED ORDER — DIBUCAINE 1 % RE OINT
1.0000 | TOPICAL_OINTMENT | RECTAL | Status: DC | PRN
Start: 2018-07-18 — End: 2018-07-20

## 2018-07-18 MED ORDER — IBUPROFEN 600 MG PO TABS
600.0000 mg | ORAL_TABLET | Freq: Four times a day (QID) | ORAL | Status: DC
  Administered 2018-07-18 – 2018-07-20 (×7): 600 mg via ORAL
  Filled 2018-07-18 (×7): qty 1

## 2018-07-18 MED ORDER — PRENATAL MULTIVITAMIN CH
1.0000 | ORAL_TABLET | Freq: Every day | ORAL | Status: DC
  Administered 2018-07-19 – 2018-07-20 (×2): 1 via ORAL
  Filled 2018-07-18 (×2): qty 1

## 2018-07-18 MED ORDER — ZOLPIDEM TARTRATE 5 MG PO TABS: 5.0000 mg | ORAL_TABLET | Freq: Every evening | ORAL | Status: DC | PRN

## 2018-07-18 MED ORDER — SIMETHICONE 80 MG PO CHEW
80.0000 mg | CHEWABLE_TABLET | ORAL | Status: DC | PRN
Start: 2018-07-18 — End: 2018-07-20

## 2018-07-18 MED ORDER — OXYTOCIN 10 UNIT/ML IJ SOLN
INTRAVENOUS | Status: DC | PRN
  Administered 2018-07-18: 40 [IU] via INTRAVENOUS

## 2018-07-18 MED ORDER — NALBUPHINE HCL 10 MG/ML IJ SOLN
5.0000 mg | INTRAMUSCULAR | Status: DC | PRN
  Administered 2018-07-18 – 2018-07-19 (×4): 5 mg via SUBCUTANEOUS
  Filled 2018-07-18 (×5): qty 1

## 2018-07-18 MED ORDER — OXYTOCIN 10 UNIT/ML IJ SOLN
INTRAMUSCULAR | Status: AC
  Filled 2018-07-18: qty 4

## 2018-07-18 MED ORDER — WITCH HAZEL-GLYCERIN EX PADS: 1.0000 "application " | MEDICATED_PAD | CUTANEOUS | Status: DC | PRN

## 2018-07-18 MED ORDER — CEFAZOLIN (ANCEF) 1 G IV SOLR: 2.0000 g | INTRAVENOUS | Status: DC

## 2018-07-18 MED ORDER — SCOPOLAMINE 1 MG/3DAYS TD PT72
MEDICATED_PATCH | TRANSDERMAL | Status: AC
  Filled 2018-07-18: qty 1

## 2018-07-18 MED ORDER — PHENYLEPHRINE 8 MG IN D5W 100 ML (0.08MG/ML) PREMIX OPTIME
INJECTION | INTRAVENOUS | Status: AC
  Filled 2018-07-18: qty 100

## 2018-07-18 MED ORDER — ONDANSETRON HCL 4 MG/2ML IJ SOLN
INTRAMUSCULAR | Status: DC | PRN
  Administered 2018-07-18: 4 mg via INTRAVENOUS

## 2018-07-18 MED ORDER — LACTATED RINGERS IV SOLN
INTRAVENOUS | Status: DC
  Administered 2018-07-18: 20:00:00 via INTRAVENOUS

## 2018-07-18 MED ORDER — MENTHOL 3 MG MT LOZG: 1.0000 | LOZENGE | OROMUCOSAL | Status: DC | PRN

## 2018-07-18 MED ORDER — MORPHINE SULFATE (PF) 0.5 MG/ML IJ SOLN
INTRAMUSCULAR | Status: AC
  Filled 2018-07-18: qty 10

## 2018-07-18 MED ORDER — BUPIVACAINE IN DEXTROSE 0.75-8.25 % IT SOLN
INTRATHECAL | Status: DC | PRN
  Administered 2018-07-18: 1.6 mg via INTRATHECAL

## 2018-07-18 MED ORDER — SIMETHICONE 80 MG PO CHEW
80.0000 mg | CHEWABLE_TABLET | ORAL | Status: DC
  Administered 2018-07-18 – 2018-07-20 (×2): 80 mg via ORAL
  Filled 2018-07-18 (×2): qty 1

## 2018-07-18 MED ORDER — OXYTOCIN 40 UNITS IN LACTATED RINGERS INFUSION - SIMPLE MED: 2.5000 [IU]/h | INTRAVENOUS | Status: AC

## 2018-07-18 MED ORDER — PHENYLEPHRINE 8 MG IN D5W 100 ML (0.08MG/ML) PREMIX OPTIME
INJECTION | INTRAVENOUS | Status: DC | PRN
  Administered 2018-07-18: 60 ug/min via INTRAVENOUS

## 2018-07-18 SURGICAL SUPPLY — 38 items
BENZOIN TINCTURE PRP APPL 2/3 (GAUZE/BANDAGES/DRESSINGS) ×3 IMPLANT
CHLORAPREP W/TINT 26ML (MISCELLANEOUS) ×3 IMPLANT
CLAMP CORD UMBIL (MISCELLANEOUS) IMPLANT
CLOSURE WOUND 1/2 X4 (GAUZE/BANDAGES/DRESSINGS) ×1
CLOTH BEACON ORANGE TIMEOUT ST (SAFETY) ×3 IMPLANT
DERMABOND ADVANCED (GAUZE/BANDAGES/DRESSINGS)
DERMABOND ADVANCED .7 DNX12 (GAUZE/BANDAGES/DRESSINGS) IMPLANT
DRAPE C SECTION CLR SCREEN (DRAPES) ×3 IMPLANT
DRSG OPSITE POSTOP 4X10 (GAUZE/BANDAGES/DRESSINGS) ×3 IMPLANT
ELECT REM PT RETURN 9FT ADLT (ELECTROSURGICAL) ×3
ELECTRODE REM PT RTRN 9FT ADLT (ELECTROSURGICAL) ×1 IMPLANT
EXTRACTOR VACUUM M CUP 4 TUBE (SUCTIONS) IMPLANT
EXTRACTOR VACUUM M CUP 4' TUBE (SUCTIONS)
GLOVE BIOGEL PI IND STRL 7.0 (GLOVE) ×2 IMPLANT
GLOVE BIOGEL PI INDICATOR 7.0 (GLOVE) ×4
GLOVE SURG SS PI 6.5 STRL IVOR (GLOVE) ×3 IMPLANT
GOWN STRL REUS W/TWL LRG LVL3 (GOWN DISPOSABLE) ×6 IMPLANT
KIT ABG SYR 3ML LUER SLIP (SYRINGE) IMPLANT
NEEDLE HYPO 25X5/8 SAFETYGLIDE (NEEDLE) IMPLANT
NS IRRIG 1000ML POUR BTL (IV SOLUTION) ×3 IMPLANT
PACK C SECTION WH (CUSTOM PROCEDURE TRAY) ×3 IMPLANT
PAD OB MATERNITY 4.3X12.25 (PERSONAL CARE ITEMS) ×3 IMPLANT
PENCIL SMOKE EVAC W/HOLSTER (ELECTROSURGICAL) ×3 IMPLANT
RTRCTR C-SECT PINK 25CM LRG (MISCELLANEOUS) ×3 IMPLANT
STRIP CLOSURE SKIN 1/2X4 (GAUZE/BANDAGES/DRESSINGS) ×2 IMPLANT
SUT CHROMIC 1 CTX 36 (SUTURE) IMPLANT
SUT CHROMIC 2 0 CT 1 (SUTURE) ×3 IMPLANT
SUT MON AB 4-0 PS1 27 (SUTURE) ×3 IMPLANT
SUT PLAIN 1 NONE 54 (SUTURE) IMPLANT
SUT PLAIN 2 0 (SUTURE)
SUT PLAIN 2 0 XLH (SUTURE) IMPLANT
SUT PLAIN ABS 2-0 CT1 27XMFL (SUTURE) IMPLANT
SUT VIC AB 0 CTX 36 (SUTURE) ×2
SUT VIC AB 0 CTX36XBRD ANBCTRL (SUTURE) ×1 IMPLANT
SUT VIC AB 1 CTX 36 (SUTURE) ×4
SUT VIC AB 1 CTX36XBRD ANBCTRL (SUTURE) ×2 IMPLANT
TOWEL OR 17X24 6PK STRL BLUE (TOWEL DISPOSABLE) ×3 IMPLANT
TRAY FOLEY W/BAG SLVR 14FR LF (SET/KITS/TRAYS/PACK) ×3 IMPLANT

## 2018-07-18 NOTE — Anesthesia Preprocedure Evaluation (Signed)
Anesthesia Evaluation  Patient identified by MRN, date of birth, ID band Patient awake    Reviewed: Allergy & Precautions, NPO status , Patient's Chart, lab work & pertinent test results  Airway Mallampati: II  TM Distance: >3 FB Neck ROM: Full    Dental no notable dental hx. (+) Teeth Intact, Dental Advisory Given   Pulmonary neg pulmonary ROS, former smoker,    Pulmonary exam normal breath sounds clear to auscultation       Cardiovascular negative cardio ROS Normal cardiovascular exam Rhythm:Regular Rate:Normal     Neuro/Psych negative neurological ROS  negative psych ROS   GI/Hepatic negative GI ROS, Neg liver ROS,   Endo/Other  negative endocrine ROSdiabetes  Renal/GU negative Renal ROS  negative genitourinary   Musculoskeletal negative musculoskeletal ROS (+)   Abdominal   Peds  Hematology negative hematology ROS (+)   Anesthesia Other Findings   Reproductive/Obstetrics (+) Pregnancy Gestational DM on glyburide                             Anesthesia Physical Anesthesia Plan  ASA: III  Anesthesia Plan: Spinal   Post-op Pain Management:    Induction:   PONV Risk Score and Plan: 2 and Treatment may vary due to age or medical condition  Airway Management Planned: Natural Airway  Additional Equipment:   Intra-op Plan:   Post-operative Plan:   Informed Consent: I have reviewed the patients History and Physical, chart, labs and discussed the procedure including the risks, benefits and alternatives for the proposed anesthesia with the patient or authorized representative who has indicated his/her understanding and acceptance.   Dental advisory given  Plan Discussed with: CRNA  Anesthesia Plan Comments:         Anesthesia Levey Evaluation

## 2018-07-18 NOTE — Transfer of Care (Signed)
Immediate Anesthesia Transfer of Care Note  Patient: Heather Lane  Procedure(s) Performed: Repeat CESAREAN SECTION (N/A )  Patient Location: PACU  Anesthesia Type:Spinal  Level of Consciousness: awake  Airway & Oxygen Therapy: Patient Spontanous Breathing  Post-op Assessment: Report given to RN and Post -op Vital signs reviewed and stable  Post vital signs: stable  Last Vitals:  Vitals Value Taken Time  BP 113/74 07/18/2018  3:03 PM  Temp    Pulse 81 07/18/2018  3:07 PM  Resp 12 07/18/2018  3:07 PM  SpO2 98 % 07/18/2018  3:07 PM  Vitals shown include unvalidated device data.  Last Pain:  Vitals:   07/18/18 1029  TempSrc: Oral  PainSc: 0-No pain      Patients Stated Pain Goal: 4 (07/18/18 1029)  Complications: No apparent anesthesia complications

## 2018-07-18 NOTE — Interval H&P Note (Signed)
History and Physical Interval Note:  07/18/2018 1:09 PM  Heather HoarLachelle R Clark  has presented today for surgery, with the diagnosis of Prior Cesarean Section with Gestational Diabetes  The various methods of treatment have been discussed with the patient and family. After consideration of risks, benefits and other options for treatment, the patient has consented to  Procedure(s): Repeat CESAREAN SECTION (N/A) as a surgical intervention .  The patient's history has been reviewed, patient examined, no change in status, stable for surgery.  I have reviewed the patient's chart and labs.  Questions were answered to the patient's satisfaction.     Konrad FelixKULWA,Dorice Stiggers WAKURU, MD.

## 2018-07-18 NOTE — Anesthesia Postprocedure Evaluation (Signed)
Anesthesia Post Note  Patient: Heather Lane  Procedure(s) Performed: Repeat CESAREAN SECTION (N/A )     Patient location during evaluation: PACU Anesthesia Type: Spinal Level of consciousness: oriented and awake and alert Pain management: pain level controlled Vital Signs Assessment: post-procedure vital signs reviewed and stable Respiratory status: spontaneous breathing, respiratory function stable and patient connected to nasal cannula oxygen Cardiovascular status: blood pressure returned to baseline and stable Postop Assessment: no headache, no backache and no apparent nausea or vomiting Anesthetic complications: no    Last Vitals:  Vitals:   07/18/18 1740 07/18/18 1859  BP: 112/82 108/71  Pulse: 88 86  Resp: 18 20  Temp: 36.8 C 37.1 C  SpO2: 98% 97%    Last Pain:  Vitals:   07/18/18 1859  TempSrc:   PainSc: 0-No pain   Pain Goal: Patients Stated Pain Goal: 4 (07/18/18 1029)               Alison Kubicki L Mashonda Broski

## 2018-07-18 NOTE — H&P (Addendum)
Heather Lane is a 27 y.o. female presenting for Repeat C/S, pregnancy complicated by [place] trait. Tobacco use, HSV Type 1, GDMA2-Glyburide 2.5mg . Absent nasal bone in fetus' panorama nml.  OB History    Gravida  2   Para  1   Term  1   Preterm      AB      Living  1     SAB      TAB      Ectopic      Multiple      Live Births  1          Past Medical History:  Diagnosis Date  . Chlamydia trachomatis infection of genitourinary sites 2009  . Depression    prior taking medications for PP depression  . Gestational diabetes   . HSV-1 (herpes simplex virus 1) infection   . Leukocytosis   . PID (pelvic inflammatory disease)    Past Surgical History:  Procedure Laterality Date  . CESAREAN SECTION  05/2011   Arrest of dilation   Family History: family history includes Diabetes in her maternal aunt, maternal uncle, and sister; Hypertension in her maternal aunt and maternal uncle. Social History:  reports that she quit smoking about 4 years ago. Her smoking use included cigarettes. She smoked 1.00 pack per day. She has never used smokeless tobacco. She reports that she drinks about 3.0 standard drinks of alcohol per week. She reports that she has current or past drug history. Drug: Marijuana.     Maternal Diabetes: Yes:  Diabetes Type:  Insulin/Medication controlled Genetic Screening: Normal Maternal Ultrasounds/Referrals: Normal Fetal Ultrasounds or other Referrals:  Referred to Materal Fetal Medicine  Maternal Substance Abuse:  Yes:  Type: Smoker Significant Maternal Medications:  Meds include: Other: Valtrex Significant Maternal Lab Results:  Lab values include: Group B Strep negative Other Comments:  None  Review of Systems  All other systems reviewed and are negative.  Maternal Medical History:  Reason for admission: Scheduled repeat C/S      Last menstrual period 10/18/2017. Maternal Exam:  Abdomen: Patient reports no abdominal tenderness. Surgical  scars: low transverse.   Introitus: not evaluated.   Cervix: not evaluated.   Fetal Exam Fetal Monitor Review: Mode: hand-held doppler probe.       Physical Exam  Physical Exam  Constitutional: She is oriented to person, place, and time. She appears well-developed and well-nourished.  HENT:  Head: Normocephalic and atraumatic.  Neck: Normal range of motion.  Cardiovascular: Normal rate, regular rhythm and normal heart sounds.   Respiratory: Effort normal and breath sounds normal.  GI: Soft. Bowel sounds are normal.  Neurological: She is alert and oriented to person, place, and time.  Skin: Skin is warm and dry.  Psychiatric: She has a normal mood and affect. Her behavior is normal.  Prenatal labs: ABO, Rh: --/--/AB POS, AB POS Performed at [facility], [address] [place], [place] [address]  [phone](08/15 1130) Antibody: NEG (08/15 1130) Rubella: Immune (01/23 0000) RPR: Non Reactive (08/15 1139)  HBsAg: Negative (01/23 0000)  HIV: Non-reactive (01/23 0000)  GBS:     Assessment/Plan: Admit for Scheduled Repeat C/S GDMA2 Routine Preop [facility] orders  Lori A Clemmons CNM 07/18/2018, 10:21 AM

## 2018-07-18 NOTE — Brief Op Note (Signed)
07/18/2018  2:58 PM  PATIENT:  Heather Lane  27 y.o. female  PRE-OPERATIVE DIAGNOSIS:  Prior Cesarean Section with Gestational Diabetes  POST-OPERATIVE DIAGNOSIS:  Prior Cesarean Section with Gestational Diabetes  PROCEDURE:  Procedure(s): Repeat CESAREAN SECTION (N/A)  SURGEON:  Surgeon(s) and Role:    * Hoover BrownsKulwa, Mady Oubre, MD - Primary  ASSISTANTS: Illene BolusLori Clemmons, CNM  ANESTHESIA:   spinal  EBL:  754 mL   BLOOD ADMINISTERED:none  DRAINS: none   LOCAL MEDICATIONS USED:  NONE  SPECIMEN:  Source of Specimen:  Cord blood.  Placenta.  DISPOSITION OF SPECIMEN:  Laboratory and labor and delivery respectively.    COUNTS:  YES  TOURNIQUET:  * No tourniquets in log *  DICTATION: .Note written in EPIC  PLAN OF CARE: Admit to inpatient   PATIENT DISPOSITION:  PACU - hemodynamically stable.   Delay start of Pharmacological VTE agent (>24hrs) due to surgical blood loss or risk of bleeding: not applicable

## 2018-07-18 NOTE — Anesthesia Procedure Notes (Addendum)
Spinal  Patient location during procedure: OR Start time: 07/18/2018 1:17 PM End time: 07/18/2018 1:37 PM Staffing Anesthesiologist: Elmer PickerWoodrum, Abrial Arrighi L, MD Performed: anesthesiologist  Preanesthetic Checklist Completed: patient identified, surgical consent, pre-op evaluation, timeout performed, IV checked, risks and benefits discussed and monitors and equipment checked Spinal Block Patient position: sitting Prep: DuraPrep Patient monitoring: cardiac monitor, continuous pulse ox and blood pressure Approach: midline Location: L3-4 Injection technique: single-shot Needle Needle type: Pencan  Needle gauge: 24 G Needle length: 9 cm Assessment Sensory level: T6 Additional Notes Functioning IV was confirmed and monitors were applied. Sterile prep and drape, including hand hygiene and sterile gloves were used. The patient was positioned and the spine was prepped. The skin was anesthetized with lidocaine.  Free flow of clear CSF was obtained prior to injecting local anesthetic into the CSF.  The spinal needle aspirated freely following injection.  The needle was carefully withdrawn.  The patient tolerated the procedure well.

## 2018-07-18 NOTE — Op Note (Signed)
  Patient: Heather Lane DOB: November 25, 1991  MRN:  [id number]  DATE OF SURGERY:   07/18/2018  PREOP DIAGNOSIS:  1. 39 week 0 day EGA intrauterine pregnancy.  2.  One prior cesarean section, desiring a repeat Cesarean section.     POSTOP DIAGNOSIS: Same as above.  PROCEDURE:Repeat low uterine segment transverse cesarean section via Pfannenstiel incision.     SURGEON: Dr.  Angus PalmsEMA Sallye OberKULWA  ASSISTANT:  Illene BolusLori Clemmons. CNM  ANESTHESIA: Spinal  COMPLICATIONS: None  FINDINGS: Viable female infant in cephalic presentation, DOA, weight 6lbs 9.6 oz, Apgar scores of 9 and 9. Normal uterus and fallopian tubes and ovaries bilaterally.    EBL:  754cc  IV FLUID:  2400 cc LR   URINE OUTPUT: 200 cc clear urine  INDICATIONS: 27 y/o P1 who presented for a repeat cesarean section.  She was consented for the procedure after explaining risks benefits and alternatives of the procedure.    PROCEDURE:   Informed consent was obtained from the patient to undergo the procedure. She was taken to the operating room where her spinal anesthesia was found to be adequate. She was prepped and draped in the usual sterile fashion and a Foley catheter was placed. She received 2 g of IV Ancef preoperatively. A Pfannenstiel incision was made with the scalpel over the old incision and the incision extended through the subcutaneous layer and also the fascia with the bovie. Small perforators in the subcutaneous layer were contained with the Bovie. The fascia was nicked in the midline and then was further separated from the rectus muscles bilaterally using Mayo scissors. Kochers were placed inferiorly and then superiorly to allow further separation of fascia from the rectus muscles.  The peritoneal cavity was entered bluntly with the fingers. The Alexis retractor was placed in. The bladder flap was created using Metzenbaum scissors.   The uterus was incised with a scalpel and the incision extended bluntly bilaterally with fingers and  bandage scisors. Clear amniotic fluid was noted.  Membranes were ruptured and the head then the rest of the body was then delivered with abdominal pressure.  She delivered a viable female infant, apgar scores 8, 9.  The edges of the uterus was grasped with Allis clamps. The cord was clamped and cut after 1 minuted. Cord blood was collected.    The uterus was not exteriorized.  The placenta was delivered with gentle traction on the umbilical cord. The uterus was cleared of clots and debris with a lap.  The uterine incision was closed with #1 Vicryl in a running locked stitch. An imbricating layer of the same stitch was placed over the initial closure.  Irrigation was applied and suctioned out. Excellent hemostasis was noted over the incision.  The muscles and peritoneum were then reapproximated using chromic suture, interrupted.  Fascia was closed using 0 Vicryl in a running stitch. The subcutaneous layer was irrigated and suctioned out.  Small perforators were contained with the bovie.  The subcutaneous layer was closed using 1-0 plain in interrupted stitches. The skin was closed using 4-0 Monocryl. Benzocaine and steri strips were applied.  Honeycomb was then applied. The patient was then cleaned and she was taken to the recovery room with her baby in stable conditions.   SPECIMEN: Placenta to labor and delivery, umbilical cord blood to lab.   DISPOSITION: TO PACU, STABLE.   Dr. Sallye OberKulwa.   07/18/18 at 1500.

## 2018-07-18 NOTE — Progress Notes (Signed)
CBG in PACU [address] Jancie Kercher [facility]

## 2018-07-19 ENCOUNTER — Encounter (HOSPITAL_COMMUNITY): Payer: Self-pay | Admitting: *Deleted

## 2018-07-19 LAB — CBC
HCT: 29.8 % — ABNORMAL LOW (ref 36.0–46.0)
Hemoglobin: 10.7 g/dL — ABNORMAL LOW (ref 12.0–15.0)
MCH: 29.6 pg (ref 26.0–34.0)
MCHC: 35.9 g/dL (ref 30.0–36.0)
MCV: 82.3 fL (ref 78.0–100.0)
PLATELETS: 241 10*3/uL (ref 150–400)
RBC: 3.62 MIL/uL — AB (ref 3.87–5.11)
RDW: 14.4 % (ref 11.5–15.5)
WBC: 17.3 10*3/uL — AB (ref 4.0–10.5)

## 2018-07-19 LAB — GLUCOSE, CAPILLARY
GLUCOSE-CAPILLARY: 91 mg/dL (ref 70–99)
GLUCOSE-CAPILLARY: 167 mg/dL — AB (ref 70–99)
Glucose-Capillary: 161 mg/dL — ABNORMAL HIGH (ref 70–99)
Glucose-Capillary: 110 mg/dL — ABNORMAL HIGH (ref 70–99)

## 2018-07-19 MED ORDER — OXYCODONE-ACETAMINOPHEN 5-325 MG PO TABS
1.0000 | ORAL_TABLET | ORAL | Status: DC | PRN
  Administered 2018-07-19 (×2): 1 via ORAL
  Filled 2018-07-19 (×2): qty 1

## 2018-07-19 MED ORDER — OXYCODONE-ACETAMINOPHEN 5-325 MG PO TABS
2.0000 | ORAL_TABLET | ORAL | Status: DC | PRN
  Administered 2018-07-20: 2 via ORAL
  Filled 2018-07-19: qty 2

## 2018-07-19 NOTE — Lactation Note (Addendum)
This note was copied from a baby's chart. Lactation Consultation Note  Patient Name: Heather Lane [name] ZOXWR'UToday's Date: 07/19/2018 Reason for consult: Follow-up assessment  RNs had set Mom up w/a DEBP. Mom was observed pumping; size 24 flanges are appropriate for her at this time. There was no yield except for residue on the pump flange. Hand expression was taught to Mom, but she found it uncomfortable. Mom reports + breast changes w/pregnancy.   "[name]" was observed at breast. His latch was good, but there were no swallows. He did not seem vigorous while suckling. Mom agreed that I could supplement at the breast with a curved-tip syringe, but when I returned, infant had come off the breast. Mom's nipple was noted to be compressed when [name] released the latch.   After using a gloved finger to put drops of EBM in his mouth, I cup fed [name] until he was satiated. Excellent tongue mobility was observed with cup feeding. During the cup feeding, 2 aunts of the mother visited. One of the aunts spoke very strongly in favor of using formula (for all feedings). Once aunts left, I reviewed benefits of breast milk with mother.  The tips of Mom's nipples are abraded. I provided Mom with coconut oil and assisted her in wearing shells to prevent friction.   Lurline HareRichey, Jazalyn Mondor [facility] 07/19/2018, 5:16 PM

## 2018-07-19 NOTE — Addendum Note (Signed)
Addendum  created 07/19/18 0827 by Trellis PaganiniBrewer, Deniz Hannan N, CRNA   Sign clinical note

## 2018-07-19 NOTE — Progress Notes (Addendum)
Subjective: Postpartum Day 1: Cesarean Delivery Patient reports incisional pain, tolerating PO and no problems voiding.    Objective: Vitals:   07/18/18 2218 07/18/18 2350 07/19/18 0415 07/19/18 0805  BP:   107/66 111/70  Pulse:   62 77  Resp:  18 18 18   Temp:  98.8 F (37.1 C) 99.4 F (37.4 C) 97.8 F (36.6 C)  TempSrc:  Oral Oral Oral  SpO2: 96% 96% 100% 98%  Weight:      Height:        Physical Exam:  General: alert and cooperative Lochia: appropriate Uterine Fundus: firm Incision: healing well, no significant drainage, no dehiscence, no significant erythema DVT Evaluation: No evidence of DVT seen on physical exam. Negative Homan's sign. No cords or calf tenderness. No significant calf/ankle edema.  Recent Labs    07/17/18 1139 07/19/18 0544  HGB 12.2 10.7*  HCT 33.0* 29.8*    Assessment/Plan: Status post Cesarean section. Doing well postoperatively.  Continue current care.  Janeece RiggersEllis K Greer 07/19/2018, 1:02 PM   I saw and examined patient at bedside and agree with above findings assessment and plan per CNM Kathalene FramesEllis Greer. Intra op findings and procedure discussed with patient.   Baby boy is for outpatient circumcision.  Dr. Sallye OberKulwa. 07/19/2018 at 1411.

## 2018-07-19 NOTE — Progress Notes (Signed)
CSW received consult for MOB due to history of postpartum depression and an EPDS score of 15. MOB and CSW discussed EPDS, MOB reports that she was just feeling overwhelmed whenever she completed the assessment. MOB reports feeling better now, much less anxious than before. MOB reports a good support system with friends and family. MOB reports her mood has been good since delivery. MOB named the baby [name]. MOB reports this is her second child, the other being 48seven years old. MOB and CSW discussed history of PPD, MOB reports lack of a strong support system and other things going on during that time. MOB stated she anticipates things will be different this time. CSW and MOB discussed PPD further and CSW encouraged MOB to monitor herself closely and be aware of symptoms and triggers. MOB agreeable to reach out to her OB if needs arise. No barriers to discharge.  Edwin Dadaarol Anurag Scarfo, MSW, LCSW-A Clinical Social Worker [facility] [phone]

## 2018-07-19 NOTE — Anesthesia Postprocedure Evaluation (Signed)
Anesthesia Post Note  Patient: Valinda HoarLachelle R Clark  Procedure(s) Performed: Repeat CESAREAN SECTION (N/A )     Patient location during evaluation: PACU Anesthesia Type: Spinal Level of consciousness: oriented and awake and alert Pain management: pain level controlled Vital Signs Assessment: post-procedure vital signs reviewed and stable Respiratory status: spontaneous breathing, respiratory function stable and patient connected to nasal cannula oxygen Cardiovascular status: blood pressure returned to baseline and stable Postop Assessment: no headache, no backache and no apparent nausea or vomiting Anesthetic complications: no    Last Vitals:  Vitals:   07/18/18 2350 07/19/18 0415  BP:  107/66  Pulse:  62  Resp: 18 18  Temp: 37.1 C 37.4 C  SpO2: 96% 100%    Last Pain:  Vitals:   07/19/18 0615  TempSrc:   PainSc: 0-No pain   Pain Goal: Patients Stated Pain Goal: 4 (07/18/18 1029)               Emmaline KluverBREWER,Bruno Leach N

## 2018-07-19 NOTE — Lactation Note (Signed)
This note was copied from a baby's chart. Lactation Consultation Note  Patient Name: Heather Lane NWGNF'AToday's Date: 07/19/2018 Reason for consult: Initial assessment;Term P2, female infant 10314 hours old. Mom GDM and smoker. LC entered room dad was holding infant and infant was sucking on a  pacifier. Discussed with parents pacifier may limit time at breast, cause infant not to cue for feeding due non-nutritive sucking  and cause nipple preference.  Discussed importance of STS to mom and baby. Per mom, wants BF longer w/ infant , she BF daughter for only 2 weeks due to  difficulties w/ latching and pain at the  breast. Per mom infant had 3 soiled diapers (meconium) and 2 wet diapers. Dad changed wet diaper when LC in room. Mom latched infant on right breast using the cross cradle position. Infant was latched  on nipple tip, LC had mom to break infant latch and try again, LC discussed w/ mom waiting until infant's  mouth has a wide gape like "biting into an apple". Mom tickle  Infant's top lip w/ breast and infant latched well. LC lowered infant's  bottom  jaw for a deeper latch,  audible swallowing was seen/ heard by LC. Per mom, latch felt better and she was not having any pain w/ latch. LC discussed I&O. Mom encouraged to feed baby 8-12 times/24 hours and with feeding cues.  Reviewed Baby & Me book's Breastfeeding Basics.  Mom made aware of O/P services, breastfeeding support groups, community resources, and our phone # for post-discharge questions.  Maternal Data Formula Feeding for Exclusion: No Has patient been taught Hand Expression?: Yes Does the patient have breastfeeding experience prior to this delivery?: Yes  Feeding Feeding Type: Breast Fed Length of feed: 10 min  LATCH Score Latch: Grasps breast easily, tongue down, lips flanged, rhythmical sucking.  Audible Swallowing: Spontaneous and intermittent  Type of Nipple: Everted at rest and after stimulation  Comfort  (Breast/Nipple): Soft / non-tender  Hold (Positioning): Assistance needed to correctly position infant at breast and maintain latch.  LATCH Score: 9  Interventions Interventions: Breast feeding basics reviewed;Assisted with latch;Skin to skin;Adjust position;Support pillows;Breast massage;Hand express;Position options;Expressed milk  Lactation Tools Discussed/Used     Consult Status Consult Status: Follow-up Date: 07/20/18 Follow-up type: In-patient    Danelle EarthlyRobin [name] 07/19/2018, 4:11 AM

## 2018-07-20 ENCOUNTER — Encounter (HOSPITAL_COMMUNITY): Payer: Self-pay | Admitting: *Deleted

## 2018-07-20 DIAGNOSIS — O24415 Gestational diabetes mellitus in pregnancy, controlled by oral hypoglycemic drugs: Secondary | ICD-10-CM | POA: Diagnosis present

## 2018-07-20 MED ORDER — OXYCODONE-ACETAMINOPHEN 5-325 MG PO TABS: 1.0000 | ORAL_TABLET | Freq: Four times a day (QID) | ORAL | 0 refills | Status: AC | PRN

## 2018-07-20 MED ORDER — IBUPROFEN 600 MG PO TABS: 600.0000 mg | ORAL_TABLET | Freq: Four times a day (QID) | ORAL | 0 refills | Status: DC | PRN

## 2018-07-20 NOTE — Discharge Summary (Signed)
OB Discharge Summary     Patient Name: Heather Lane DOB: [birth year] MRN: [id number]  Date of admission: 07/18/2018 Delivering MD: Hoover BrownsKULWA, EMA   Date of discharge: 07/20/2018  Admitting diagnosis: Prior Cesarean Section with Gestational Diabetes Intrauterine pregnancy: 171w2d     Secondary diagnosis:  Active Problems:   Previous cesarean delivery, delivered   Status post cesarean section   Gestational diabetes mellitus (GDM) controlled on oral hypoglycemic drug     Discharge diagnosis: Term Pregnancy Delivered and GDM A2                                                                                                Post partum procedures:n/a  Augmentation: n/a  Complications: None  Hospital course:  Sceduled C/S   27 y.o. yo G2P1001 at 2971w2d was admitted to the hospital 07/18/2018 for scheduled cesarean section with the following indication:Elective Repeat and gestational diabetes A2.  Membrane Rupture Time/Date:   ,    Patient delivered a Viable infant.07/18/2018  Details of operation can be found in separate operative note.  Pateint had an uncomplicated postpartum course.  She is ambulating, tolerating a regular diet, passing flatus, and urinating well. Patient is discharged home in stable condition on  07/20/18         Physical exam  Vitals:   07/19/18 0805 07/19/18 1320 07/19/18 2208 07/20/18 0659  BP: 111/70 107/72 100/66 106/70  Pulse: 77 88 85 81  Resp: 18 16 16 16   Temp: 97.8 F (36.6 C) 98.7 F (37.1 C) 99 F (37.2 C) 98.5 F (36.9 C)  TempSrc: Oral  Oral Oral  SpO2: 98% 100% 97% 91%  Weight:      Height:       General: alert, cooperative and no distress Lochia: appropriate Uterine Fundus: firm Incision: Healing well with no significant drainage, No significant erythema, Dressing is clean, dry, and intact DVT Evaluation: No evidence of DVT seen on physical exam. Negative Homan's sign. No cords or calf tenderness. No significant calf/ankle  edema. Labs: Lab Results  Component Value Date   WBC 17.3 (H) 07/19/2018   HGB 10.7 (L) 07/19/2018   HCT 29.8 (L) 07/19/2018   MCV 82.3 07/19/2018   PLT 241 07/19/2018   CMP Latest Ref Rng & Units 07/17/2018  Glucose 70 - 99 mg/dL 92  BUN 6 - 20 mg/dL 14  Creatinine 4.090.44 - 8.111.00 mg/dL 9.140.65  Sodium 782135 - 956145 mmol/L 135  Potassium 3.5 - 5.1 mmol/L 4.0  Chloride 98 - 111 mmol/L 103  CO2 22 - 32 mmol/L 21(L)  Calcium 8.9 - 10.3 mg/dL 9.0  Total Protein 6.5 - 8.1 g/dL 6.3(L)  Total Bilirubin 0.3 - 1.2 mg/dL 0.9  Alkaline Phos 38 - 126 U/L 100  AST 15 - 41 U/L 16  ALT 0 - 44 U/L 13    Discharge instruction: per After Visit Summary and "Baby and Me Booklet".  After visit meds:  Allergies as of 07/20/2018      Reactions   Ciprofloxacin    Skin peeling   Pollen Extract Itching      Medication  List    STOP taking these medications   glyBURIDE 2.5 MG tablet Commonly known as:  DIABETA   TARON-C DHA 53.5-38-1 MG Caps     TAKE these medications   cetirizine 10 MG tablet Commonly known as:  ZYRTEC Take 10 mg by mouth daily as needed for allergies.   ibuprofen 600 MG tablet Commonly known as:  ADVIL,MOTRIN Take 1 tablet (600 mg total) by mouth every 6 (six) hours as needed for mild pain or moderate pain. What changed:    medication strength  how much to take  when to take this  reasons to take this   oxyCODONE-acetaminophen 5-325 MG tablet Commonly known as:  PERCOCET/ROXICET Take 1 tablet by mouth every 6 (six) hours as needed for up to 7 days for severe pain.   Prenatal Vitamins 0.8 MG tablet Take 1 tablet by mouth daily.      Gestational diabetes: Patient with acceptable fasting blood glucose values, will reassess at postpartum visit in 6 weeks.   Diet: routine diet  Activity: Advance as tolerated. Pelvic rest for 6 weeks.   Outpatient follow up:6 weeks Follow up Appt:No future appointments. Follow up Visit:No follow-ups on file.  Postpartum  contraception: Undecided  Newborn Data: Live born female  Birth Weight: 6 lb 9.6 oz (2995 g) APGAR: 8, 9  Newborn Delivery   Birth date/time:  07/18/2018 14:06:00 Delivery type:  C-Section, Low Transverse Trial of labor:  No C-section categorization:  Repeat     Baby Feeding: Bottle and Breast Disposition:home with mother   07/20/2018 Heather Lane, CNM

## 2018-07-20 NOTE — Discharge Instructions (Signed)
Postpartum Care After Cesarean Delivery °The period of time right after you deliver your newborn is called the postpartum period. °What kind of medical care will I receive? °· You may continue to receive fluids and medicines through an IV tube inserted into one of your veins. °· You may have small, flexible tube (catheter) draining urine from your bladder into a bag outside of your body. The catheter will be removed as soon as possible. °· You may be given a squirt bottle to use when you go to the bathroom. You may use this until you are comfortable wiping as usual. To use the squirt bottle, follow these steps: °? Before you urinate, fill the squirt bottle with warm water. The water should be warm. Do not use hot water. °? After you urinate, while you are sitting on the toilet, use the squirt bottle to rinse the area around your urethra and vaginal opening. This rinses away any urine and blood. °? You may do this instead of wiping. As you start healing, you may use the squirt bottle before wiping yourself. Make sure to wipe gently. °? Fill the squirt bottle with clean water every time you use the bathroom. °· You will be given sanitary pads to wear. °· Your incision will be monitored to make sure it is healing properly. You will be told when it is safe for your stitches, staples, or skin adhesive tape to be removed. °What can I expect? °· You may not feel the need to urinate for several hours after delivery. °· You will have some soreness and pain in your abdomen. You may have a small amount of blood or clear fluid coming from your incision. °· If you are breastfeeding, you may have uterine contractions every time you breastfeed for up to several weeks postpartum. Uterine contractions help your uterus return to its normal size. °· It is normal to have vaginal bleeding (lochia) after delivery. The amount and appearance of lochia is often similar to a menstrual period in the first week after delivery. It will  gradually decrease over the next few weeks to a dry, yellow-brown discharge. For most women, lochia stops completely by 6-8 weeks after delivery. Vaginal bleeding can vary from woman to woman. °· Within the first few days after delivery, you may have breast engorgement. This is when your breasts feel heavy, full, and uncomfortable. Your breasts may also throb and feel hard, tightly stretched, warm, and tender. After this occurs, you may have milk leaking from your breasts. Your health care provider can help you relieve discomfort due to breast engorgement. Breast engorgement should go away within a few days. °· You may feel more sad or worried than normal due to hormonal changes after delivery. These feelings should not last more than a few days. If these feelings do not go away after several days, speak with your health care provider. °How should I care for myself? °· Tell your health care provider if you have pain or discomfort. °· Drink enough water to keep your urine clear or pale yellow. °· Wash your hands thoroughly with soap and water for at least 20 seconds after changing your sanitary pads or using the toilet, and before holding or feeding your baby. °· If you are not breastfeeding, avoid touching your breasts a lot. Doing this can make your breasts produce more milk. °· If you become weak or lightheaded, or you feel like you might faint, ask for help before: °? Getting out of bed. °? Showering. °·   Change your sanitary pads frequently. Watch for any changes in your flow, such as a sudden increase in volume, a change in color, or the passing of large blood clots. If you pass a blood clot from your vagina, save it to show to your health care provider. Do not flush blood clots down the toilet without having your health care provider look at them. °· Make sure that all your vaccinations are up to date. This can help protect you and your baby from getting certain diseases. You may need to have immunizations done  before you leave the hospital. °· If desired, talk with your health care provider about methods of family planning or birth control (contraception). °How can I start bonding with my baby? °Spending as much time as possible with your baby is very important. During this time, you and your baby can get to know each other and develop a bond. Having your baby stay with you in your room (rooming in) can give you time to get to know your baby. Rooming in can also help you become comfortable caring for your baby. Breastfeeding can also help you bond with your baby. °How can I plan for returning home with my baby? °· Make sure that you have a car seat installed in your vehicle. °? Your car seat should be checked by a certified car seat installer to make sure that it is installed safely. °? Make sure that your baby fits into the car seat safely. °· Ask your health care provider any questions you have about caring for yourself or your baby. Make sure that you are able to contact your health care provider with any questions after leaving the hospital. °This information is not intended to replace advice given to you by your health care provider. Make sure you discuss any questions you have with your health care provider. °Document Released: 08/13/2012 Document Revised: 04/23/2016 Document Reviewed: 10/24/2015 °Elsevier Interactive Patient Education © 2018 [employer] ° ° °Postpartum Depression and Baby Blues °The postpartum period begins right after the birth of a baby. During this time, there is often a great amount of joy and excitement. It is also a time of many changes in the life of the parents. Regardless of how many times a mother gives birth, each child brings new challenges and dynamics to the family. It is not unusual to have feelings of excitement along with confusing shifts in moods, emotions, and thoughts. All mothers are at risk of developing postpartum depression or the "baby blues." These mood changes can occur  right after giving birth, or they may occur many months after giving birth. The baby blues or postpartum depression can be mild or severe. Additionally, postpartum depression can go away rather quickly, or it can be a long-term condition. °What are the causes? °Raised hormone levels and the rapid drop in those levels are thought to be a main cause of postpartum depression and the baby blues. A number of hormones change during and after pregnancy. Estrogen and progesterone usually decrease right after the delivery of your baby. The levels of thyroid hormone and various cortisol steroids also rapidly drop. Other factors that play a role in these mood changes include major life events and genetics. °What increases the risk? °If you have any of the following risks for the baby blues or postpartum depression, know what symptoms to watch out for during the postpartum period. Risk factors that may increase the likelihood of getting the baby blues or postpartum depression include: °·   Having a personal or family history of depression. °· Having depression while being pregnant. °· Having premenstrual mood issues or mood issues related to oral contraceptives. °· Having a lot of life stress. °· Having marital conflict. °· Lacking a social support network. °· Having a baby with special needs. °· Having health problems, such as diabetes. ° °What are the signs or symptoms? °Symptoms of baby blues include: °· Brief changes in mood, such as going from extreme happiness to sadness. °· Decreased concentration. °· Difficulty sleeping. °· Crying spells, tearfulness. °· Irritability. °· Anxiety. ° °Symptoms of postpartum depression typically begin within the first month after giving birth. These symptoms include: °· Difficulty sleeping or excessive sleepiness. °· Marked weight loss. °· Agitation. °· Feelings of worthlessness. °· Lack of interest in activity or food. ° °Postpartum psychosis is a very serious condition and can be  dangerous. Fortunately, it is rare. Displaying any of the following symptoms is cause for immediate medical attention. Symptoms of postpartum psychosis include: °· Hallucinations and delusions. °· Bizarre or disorganized behavior. °· Confusion or disorientation. ° °How is this diagnosed? °A diagnosis is made by an evaluation of your symptoms. There are no medical or lab tests that lead to a diagnosis, but there are various questionnaires that a health care provider may use to identify those with the baby blues, postpartum depression, or psychosis. Often, a screening tool called the Edinburgh Postnatal Depression Scale is used to diagnose depression in the postpartum period. °How is this treated? °The baby blues usually goes away on its own in 1-2 weeks. Social support is often all that is needed. You will be encouraged to get adequate sleep and rest. Occasionally, you may be given medicines to help you sleep. °Postpartum depression requires treatment because it can last several months or longer if it is not treated. Treatment may include individual or group therapy, medicine, or both to address any social, physiological, and psychological factors that may play a role in the depression. Regular exercise, a healthy diet, rest, and social support may also be strongly recommended. °Postpartum psychosis is more serious and needs treatment right away. Hospitalization is often needed. °Follow these instructions at home: °· Get as much rest as you can. Nap when the baby sleeps. °· Exercise regularly. Some women find yoga and walking to be beneficial. °· Eat a balanced and nourishing diet. °· Do little things that you enjoy. Have a cup of tea, take a bubble bath, read your favorite magazine, or listen to your favorite music. °· Avoid alcohol. °· Ask for help with household chores, cooking, grocery shopping, or running errands as needed. Do not try to do everything. °· Talk to people close to you about how you are feeling.  Get support from your partner, family members, friends, or other new moms. °· Try to stay positive in how you think. Think about the things you are grateful for. °· Do not spend a lot of time alone. °· Only take over-the-counter or prescription medicine as directed by your health care provider. °· Keep all your postpartum appointments. °· Let your health care provider know if you have any concerns. °Contact a health care provider if: °You are having a reaction to or problems with your medicine. °Get help right away if: °· You have suicidal feelings. °· You think you may harm the baby or someone else. °This information is not intended to replace advice given to you by your health care provider. Make sure you discuss any questions you have   with your health care provider. °Document Released: 08/23/2004 Document Revised: 04/26/2016 Document Reviewed: 08/31/2013 °Elsevier Interactive Patient Education © 2017 [employer] ° °

## 2018-07-21 LAB — GLUCOSE, CAPILLARY: GLUCOSE-CAPILLARY: 85 mg/dL (ref 70–99)

## 2018-07-22 ENCOUNTER — Inpatient Hospital Stay (HOSPITAL_COMMUNITY)
Admission: AD | Admit: 2018-07-22 | Discharge: 2018-07-22 | Disposition: A | Discharge status: Home / Self Care | Payer: Medicaid Other | Source: Ambulatory Visit | Attending: Obstetrics & Gynecology | Admitting: Obstetrics & Gynecology

## 2018-07-22 ENCOUNTER — Encounter (HOSPITAL_COMMUNITY): Payer: Self-pay

## 2018-07-22 DIAGNOSIS — Z4889 Encounter for other specified surgical aftercare: Secondary | ICD-10-CM

## 2018-07-22 DIAGNOSIS — O9089 Other complications of the puerperium, not elsewhere classified: Secondary | ICD-10-CM | POA: Insufficient documentation

## 2018-07-22 DIAGNOSIS — Z87891 Personal history of nicotine dependence: Secondary | ICD-10-CM | POA: Diagnosis not present

## 2018-07-22 NOTE — MAU Provider Note (Signed)
Chief Complaint: Drainage from Incision   SUBJECTIVE HPI: Heather Lane is a 27 y.o. G2P2002 at Unknown who presents to MAU for being d/c on the 08/18 from a repeat c/s. Pt also had GDMA2 tx with glyburide during pregnancy. Pt has a c/s on 08/16 and present with worry of infections. Pt endorses she saw a little blood and was nervous, she also felt a sharp pain when she moved. Pt has been taking pain meds which helps. Pt endorses vaginal bleeding to be light and denies fever, n, v, rashes, redness, no uterine tenderness present. Pt stated she is breastfeeding and it is going well. .    Past Medical History:  Diagnosis Date  . Chlamydia trachomatis infection of genitourinary sites 2009  . Depression    prior taking medications for PP depression  . Gestational diabetes   . HSV-1 (herpes simplex virus 1) infection   . Leukocytosis   . PID (pelvic inflammatory disease)    OB History  Gravida Para Term Preterm AB Living  2 2 2     2   SAB TAB Ectopic Multiple Live Births          1    # Outcome Date GA Lbr Len/2nd Weight Sex Delivery Anes PTL Lv  2 Term 05/19/11 [redacted]w[redacted]d  3062 g F CS-LTranv EPI N LIV  1 Term      CS-LTranv      Past Surgical History:  Procedure Laterality Date  . CESAREAN SECTION  05/2011   Arrest of dilation  . CESAREAN SECTION N/A 07/18/2018   Procedure: Repeat CESAREAN SECTION;  Surgeon: Hoover Browns, MD;  Location: [facility];  Service: Obstetrics;  Laterality: N/A;   Social History   Socioeconomic History  . Marital status: Married    Spouse name: Not on file  . Number of children: Not on file  . Years of education: Not on file  . Highest education level: Not on file  Occupational History  . Not on file  Social Needs  . Financial resource strain: Not hard at all  . Food insecurity:    Worry: Never true    Inability: Never true  . Transportation needs:    Medical: No    Non-medical: No  Tobacco Use  . Smoking status: Former Smoker    Packs/day:  1.00    Types: Cigarettes    Last attempt to quit: 03/15/2014    Years since quitting: 4.3  . Smokeless tobacco: Never Used  Substance and Sexual Activity  . Alcohol use: Not Currently    Alcohol/week: 3.0 standard drinks    Types: 3 Glasses of wine per week    Comment: last use dec 2018  . Drug use: Not Currently    Types: Marijuana    Comment: denies  . Sexual activity: Not Currently    Partners: Male    Birth control/protection: None  Lifestyle  . Physical activity:    Days per week: Not on file    Minutes per session: Not on file  . Stress: Not on file  Relationships  . Social connections:    Talks on phone: Not on file    Gets together: Not on file    Attends religious service: Not on file    Active member of club or organization: Not on file    Attends meetings of clubs or organizations: Not on file    Relationship status: Not on file  . Intimate partner violence:    Fear of current  or ex partner: No    Emotionally abused: No    Physically abused: No    Forced sexual activity: No  Other Topics Concern  . Not on file  Social History Narrative  . Not on file   No current facility-administered medications on file prior to encounter.    Current Outpatient Medications on File Prior to Encounter  Medication Sig Dispense Refill  . cetirizine (ZYRTEC) 10 MG tablet Take 10 mg by mouth daily as needed for allergies.    Marland Kitchen. ibuprofen (ADVIL,MOTRIN) 600 MG tablet Take 1 tablet (600 mg total) by mouth every 6 (six) hours as needed for mild pain or moderate pain. 30 tablet 0  . oxyCODONE-acetaminophen (PERCOCET/ROXICET) 5-325 MG tablet Take 1 tablet by mouth every 6 (six) hours as needed for up to 7 days for severe pain. 30 tablet 0  . Prenatal Multivit-Min-Fe-FA (PRENATAL VITAMINS) 0.8 MG tablet Take 1 tablet by mouth daily. (Patient not taking: Reported on 07/14/2018) 30 tablet 3   Allergies  Allergen Reactions  . Ciprofloxacin     Skin peeling  . Pollen Extract Itching     I have reviewed the past Medical Hx, Surgical Hx, Social Hx, Allergies and Medications.   REVIEW OF SYSTEMS All systems reviewed and are negative for acute change except as noted in the HPI.   OBJECTIVE BP 118/68 (BP Location: Left Arm)   Pulse 86   Temp 98.7 F (37.1 C) (Oral)   Resp 18   Ht 5\' 5"  (1.651 m)   Wt 81.2 kg   SpO2 100%   BMI 29.79 kg/m    PHYSICAL EXAM Constitutional: Well-developed, well-nourished female in no acute distress.  Cardiovascular: normal rate and rhythm, pulses intact Respiratory: normal rate and effort.  GI: Abd soft, non-tender, non-distended. Pos BS x 4 MS: Extremities nontender, no edema, normal ROM Neurologic: Alert and oriented x 4. No focal deficits GU: Neg CVAT. Lochia: appropriate Uterine Fundus: firm Incision: Healing well with no significant drainage, No significant erythema, Dressing is clean, dry, and intact. One small dime sized dried dark blood present on dressing, but otherwise dressing CDI. DVT Evaluation: No evidence of DVT seen on physical exam. Negative Homan's sign. No cords or calf tenderness. No significant calf/ankle edema.Marland Kitchen. Psych: normal mood and affect  LAB RESULTS No results found for this or any previous visit (from the past 24 hour(s)).  IMAGING No results found.  MAU Management/MDM: Vitals and nursing notes reviewed Orders Placed This Encounter  Procedures  . Diet - low sodium heart healthy  . Increase activity slowly  . Call MD for:  . Call MD for:  temperature >100.4  . Call MD for:  persistant nausea and vomiting  . Call MD for:  severe uncontrolled pain  . Call MD for:  redness, tenderness, or signs of infection (pain, swelling, redness, odor or green/yellow discharge around incision site)  . Call MD for:  difficulty breathing, headache or visual disturbances  . Call MD for:  hives  . Call MD for:  persistant dizziness or light-headedness  . Call MD for:  extreme fatigue  . (HEART FAILURE  PATIENTS) Call MD:  Anytime you have any of the following symptoms: 1) 3 pound weight gain in 24 hours or 5 pounds in 1 week 2) shortness of breath, with or without a dry hacking cough 3) swelling in the hands, feet or stomach 4) if you have to sleep on extra pillows at night in order to breathe.  . Discharge patient  Discharge disposition: 01-Home or Self Care; Discharge patient date: 07/22/2018    No orders of the defined types were placed in this encounter.   Plan of care reviewed with patient, including labs and tests ordered and medical treatment.    ASSESSMENT 1. Encounter for postoperative wound check   There was no active bleeding, redness or signs of infection present. No dressing change was indicated at this time. No antibiotics were indicated at this time.   PLAN Discharge home in stable condition. Pt discharged with strict infectious precautions. Counseled on return precautions May remove honeycomb dressing a 10 days post op, return if foul smell fever or abdominal pain.  Handout given  Follow-up Information    [facility].   Specialty:  Obstetrics and Gynecology Why:  at 6 weel PPV already made  Contact information: [address] [place] [id number] [phone]          Allergies as of 07/22/2018      Reactions   Ciprofloxacin    Skin peeling   Pollen Extract Itching      Medication List    TAKE these medications   cetirizine 10 MG tablet Commonly known as:  ZYRTEC Take 10 mg by mouth daily as needed for allergies.   ibuprofen 600 MG tablet Commonly known as:  ADVIL,MOTRIN Take 1 tablet (600 mg total) by mouth every 6 (six) hours as needed for mild pain or moderate pain.   oxyCODONE-acetaminophen 5-325 MG tablet Commonly known as:  PERCOCET/ROXICET Take 1 tablet by mouth every 6 (six) hours as needed for up to 7 days for severe pain.   Prenatal Vitamins 0.8 MG tablet Take 1  tablet by mouth daily.     Heather Lane  07/22/2018, 10:47 PM

## 2018-07-22 NOTE — MAU Note (Signed)
S/p repeat c/s on 08/16 presents with reports of redness and bleeding from her incision. Some incisional pain at the site of the bleeding.

## 2019-06-16 ENCOUNTER — Ambulatory Visit (HOSPITAL_COMMUNITY)
Admission: EM | Admit: 2019-06-16 | Discharge: 2019-06-16 | Disposition: A | Discharge status: Home / Self Care | Payer: BLUE CROSS/BLUE SHIELD | Attending: Family Medicine | Admitting: Family Medicine

## 2019-06-16 ENCOUNTER — Other Ambulatory Visit: Payer: Self-pay

## 2019-06-16 ENCOUNTER — Encounter (HOSPITAL_COMMUNITY): Payer: Self-pay | Admitting: Emergency Medicine

## 2019-06-16 ENCOUNTER — Ambulatory Visit (INDEPENDENT_AMBULATORY_CARE_PROVIDER_SITE_OTHER): Payer: BLUE CROSS/BLUE SHIELD

## 2019-06-16 DIAGNOSIS — R1030 Lower abdominal pain, unspecified: Secondary | ICD-10-CM

## 2019-06-16 LAB — POCT PREGNANCY, URINE: Preg Test, Ur: NEGATIVE

## 2019-06-16 LAB — POCT URINALYSIS DIP (DEVICE)
Glucose, UA: NEGATIVE mg/dL
Hgb urine dipstick: NEGATIVE
Ketones, ur: 40 mg/dL — AB
Nitrite: NEGATIVE
Protein, ur: 30 mg/dL — AB
Specific Gravity, Urine: >=1.03 (ref 1.005–1.030)
Urobilinogen, UA: 0.2 mg/dL (ref 0.0–1.0)
pH: 6 (ref 5.0–8.0)

## 2019-06-16 NOTE — ED Provider Notes (Signed)
[facility]    CSN: [id number] Arrival date & time: 06/16/19  [id number]     History   Chief Complaint Chief Complaint  Patient presents with  . Abdominal Pain    HPI Heather Lane is a 28 y.o. female.   Patient is a 28 year old female with past medical history of chlamydia, gestational diabetes, HSV, PID.  She presents with approximately 1 month of intermittent, waxing waning abdominal discomfort.  She describes it as knots in her stomach.  Reported the pain is worse in the morning after waking up.  Denies any associated nausea, vomiting.  She has had some loose stools.  Reports she has not had a good bowel movement in the last 3 to 4 days.  Denies any blood in stool.  Denies any vaginal discharge, itching or irritation.  Denies any dysuria, hematuria or urinary frequency.  She would like to be tested for STDs.  Reporting she felt similar in the past when she had an STD.  She is currently sexually active with one partner, unprotected, off and on.  No LMP recorded. Patient has had an injection.   ROS per HPI      Past Medical History:  Diagnosis Date  . Chlamydia trachomatis infection of genitourinary sites 2009  . Depression    prior taking medications for PP depression  . Gestational diabetes   . HSV-1 (herpes simplex virus 1) infection   . Leukocytosis   . PID (pelvic inflammatory disease)     Patient Active Problem List   Diagnosis Date Noted  . Gestational diabetes mellitus (GDM) controlled on oral hypoglycemic drug 07/20/2018  . Previous cesarean delivery, delivered 07/18/2018  . Status post cesarean section 07/18/2018  . Vaginal bleeding 09/08/2017  . Bilateral wheezing 09/08/2017  . Cough in adult 09/08/2017  . Pelvic abscess in female 03/21/2014  . Chlamydia 03/21/2014  . History of herpes genitalis 03/21/2014  . Gonorrhea 03/14/2014  . Previous cesarean delivery 01/27/2013  . Cigarette smoker 01/27/2013    Past Surgical History:  Procedure  Laterality Date  . CESAREAN SECTION  05/2011   Arrest of dilation  . CESAREAN SECTION N/A 07/18/2018   Procedure: Repeat CESAREAN SECTION;  Surgeon: Hoover BrownsKulwa, Ema, MD;  [facility]: [facility];  Service: Obstetrics;  Laterality: N/A;    OB History    Gravida  2   Para  2   Term  2   Preterm      AB      Living  2     SAB      TAB      Ectopic      Multiple      Live Births  1            Home Medications    Prior to Admission medications   Medication Sig Start Date End Date Taking? Authorizing Provider  Estradiol Cypionate (DEPO-ESTRADIOL IM) Inject into the muscle.   Yes [provider]  ibuprofen (ADVIL,MOTRIN) 600 MG tablet Take 1 tablet (600 mg total) by mouth every 6 (six) hours as needed for mild pain or moderate pain. 07/20/18   Janeece RiggersGreer, Ellis K, CNM  cetirizine (ZYRTEC) 10 MG tablet Take 10 mg by mouth daily as needed for allergies.  06/16/19  [provider]    Family History Family History  Problem Relation Age of Onset  . Diabetes Sister   . Diabetes Maternal Aunt   . Hypertension Maternal Aunt   . Diabetes Maternal Uncle   .  Hypertension Maternal Uncle     Social History Social History   Tobacco Use  . Smoking status: Former Smoker    Packs/day: 1.00    Types: Cigarettes    Quit date: 03/15/2014    Years since quitting: 5.2  . Smokeless tobacco: Never Used  Substance Use Topics  . Alcohol use: Not Currently    Alcohol/week: 3.0 standard drinks    Types: 3 Glasses of wine per week    Comment: last use dec 2018  . Drug use: Not Currently    Types: Marijuana    Comment: denies     Allergies   Ciprofloxacin and Pollen extract   Review of Systems Review of Systems   Physical Exam Triage Vital Signs ED Triage Vitals  Enc Vitals Group     BP 06/16/19 0908 113/69     Pulse Rate 06/16/19 0908 96     Resp 06/16/19 0908 16     Temp 06/16/19 0908 99.7 F (37.6 C)     Temp Source 06/16/19 0908 Oral     SpO2  06/16/19 0908 99 %     Weight --      Height --      Head Circumference --      Peak Flow --      Pain Score 06/16/19 0903 8     Pain Loc --      Pain Edu? --      Excl. in GC? --    No data found.  Updated Vital Signs BP 113/69 (BP Location: Right Arm)   Pulse 96   Temp 99.7 F (37.6 C) (Oral)   Resp 16   SpO2 99%   Visual Acuity Right Eye Distance:   Left Eye Distance:   Bilateral Distance:    Right Eye Near:   Left Eye Near:    Bilateral Near:     Physical Exam Vitals signs and nursing note reviewed.  Constitutional:      General: She is not in acute distress.    Appearance: She is well-developed. She is not ill-appearing, toxic-appearing or diaphoretic.  Pulmonary:     Effort: Pulmonary effort is normal.  Abdominal:     General: Bowel sounds are normal. There is distension. There is no abdominal bruit.     Palpations: Abdomen is soft. There is no shifting dullness, fluid wave, hepatomegaly or splenomegaly.     Tenderness: There is abdominal tenderness in the right lower quadrant, periumbilical area, suprapubic area and left lower quadrant. There is no right CVA tenderness, left CVA tenderness, guarding or rebound. Negative signs include Murphy's sign.     Hernia: No hernia is present.  Skin:    General: Skin is warm and dry.     Findings: No rash.  Neurological:     Mental Status: She is alert.  Psychiatric:        Mood and Affect: Mood normal.      UC Treatments / Results  Labs (all labs ordered are listed, but only abnormal results are displayed) Labs Reviewed  POCT URINALYSIS DIP (DEVICE) - Abnormal; Notable for the following components:      Result Value   Bilirubin Urine SMALL (*)    Ketones, ur 40 (*)    Protein, ur 30 (*)    Leukocytes,Ua TRACE (*)    All other components within normal limits  URINE CULTURE  POCT PREGNANCY, URINE  CERVICOVAGINAL ANCILLARY ONLY    EKG   Radiology Dg Abd 1 View  Result Date: 06/16/2019 CLINICAL DATA:   Abdominal pain, no fever EXAM: ABDOMEN - 1 VIEW COMPARISON:  None. FINDINGS: The bowel gas pattern is normal. No radio-opaque calculi or other significant radiographic abnormality are seen. Levocurvature of the lumbar spine. IMPRESSION: Negative. Electronically Signed   By: Kathreen Devoid   On: 06/16/2019 09:45    Procedures Procedures (including critical care time)  Medications Ordered in UC Medications - No data to display  Initial Impression / Assessment and Plan / UC Course  I have reviewed the triage vital signs and the nursing notes.  Pertinent labs & imaging results that were available during my care of the patient were reviewed by me and considered in my medical decision making (see chart for details).     Patient is a 28 year old female the presents today with approximately 1 month of waxing waning, generalized abdominal discomfort.  Describes as knots. Upon exam patient most tender in the lower abdomen, generalized.  No rebound or guarding. VSS Non toxic or ill appearing.  No acute abdomen Urine was negative for infection or pregnancy. Slightly dehydrated on urine. Sending self swab for testing to rule out any STDs or fungal infection. Abdomen 1 view,without significant stool burden.   Recommended push fluids and try some Gas-X. Instructed that we will call her with any positive results and treat as needed based on results Instructed that if her symptoms worsen to include more severe abdominal pain, pelvic pain, vomiting she will need to go the ER. Patient understand and agree.  Final Clinical Impressions(s) / UC Diagnoses   Final diagnoses:  Lower abdominal pain     Discharge Instructions     Your urine did not show any signs of infection.  We will send this for culture. You do appear to be a little dehydrated.  Make sure you are pushing fluids and increasing your water intake. There was not a significant amount of stool on your abdominal x-ray but did show some gas.  You could try doing some Gas-X We are sending your swab for testing.  We will call you with any positive results and and treat as needed. Otherwise monitor your symptoms and for any severe worsening symptoms you need to go to the ER.    ED Prescriptions    None     Controlled Substance Prescriptions [place] Controlled Substance Registry consulted? Not Applicable   Orvan July, NP 06/16/19 1013

## 2019-06-16 NOTE — Discharge Instructions (Addendum)
Your urine did not show any signs of infection.  We will send this for culture. You do appear to be a little dehydrated.  Make sure you are pushing fluids and increasing your water intake. There was not a significant amount of stool on your abdominal x-ray but did show some gas. You could try doing some Gas-X We are sending your swab for testing.  We will call you with any positive results and and treat as needed. Otherwise monitor your symptoms and for any severe worsening symptoms you need to go to the ER.

## 2019-06-16 NOTE — ED Triage Notes (Signed)
Reports a month of abdominal pain.  Reports near fathers day had a lot of alcohol, and thought to be related to that.  Over the past week abdomen has hurt in general, pain "goes into knots"  Patient denies vaginal discharge, denies urinary symptoms.  Last bm was this am, loose bm

## 2019-06-17 LAB — CERVICOVAGINAL ANCILLARY ONLY
Bacterial vaginitis: POSITIVE — AB
Candida vaginitis: NEGATIVE
Chlamydia: NEGATIVE
Neisseria Gonorrhea: NEGATIVE
Trichomonas: NEGATIVE

## 2019-06-17 LAB — URINE CULTURE

## 2019-06-19 ENCOUNTER — Telehealth (HOSPITAL_COMMUNITY): Payer: Self-pay | Admitting: Emergency Medicine

## 2019-06-19 ENCOUNTER — Telehealth (HOSPITAL_COMMUNITY): Payer: Self-pay | Admitting: Family Medicine

## 2019-06-19 MED ORDER — METRONIDAZOLE 500 MG PO TABS: 500.0000 mg | ORAL_TABLET | Freq: Two times a day (BID) | ORAL | 0 refills | Status: DC

## 2019-06-19 NOTE — Telephone Encounter (Signed)
Bacterial vaginosis is positive. This was not treated at the urgent care visit.  Flagyl 500 mg BID x 7 days #14 no refills sent to patients pharmacy of choice.    Patient contacted and made aware of all results, all questions answered.  

## 2019-06-19 NOTE — Telephone Encounter (Signed)
Sending flagyl to the pharmacy to treat BV Pt phone went to voicemail will attempt to call later

## 2020-12-07 ENCOUNTER — Encounter (HOSPITAL_COMMUNITY): Payer: Self-pay | Admitting: Emergency Medicine

## 2020-12-07 ENCOUNTER — Other Ambulatory Visit: Payer: Self-pay

## 2020-12-07 ENCOUNTER — Emergency Department (HOSPITAL_COMMUNITY)
Admission: EM | Admit: 2020-12-07 | Discharge: 2020-12-07 | Disposition: A | Discharge status: Home / Self Care | Payer: BLUE CROSS/BLUE SHIELD | Attending: Emergency Medicine | Admitting: Emergency Medicine

## 2020-12-07 DIAGNOSIS — R0902 Hypoxemia: Secondary | ICD-10-CM | POA: Diagnosis not present

## 2020-12-07 DIAGNOSIS — R0981 Nasal congestion: Secondary | ICD-10-CM | POA: Insufficient documentation

## 2020-12-07 DIAGNOSIS — R110 Nausea: Secondary | ICD-10-CM | POA: Diagnosis not present

## 2020-12-07 DIAGNOSIS — Z5321 Procedure and treatment not carried out due to patient leaving prior to being seen by health care provider: Secondary | ICD-10-CM | POA: Insufficient documentation

## 2020-12-07 DIAGNOSIS — R197 Diarrhea, unspecified: Secondary | ICD-10-CM | POA: Insufficient documentation

## 2020-12-07 DIAGNOSIS — R1111 Vomiting without nausea: Secondary | ICD-10-CM | POA: Diagnosis not present

## 2020-12-07 DIAGNOSIS — R1084 Generalized abdominal pain: Secondary | ICD-10-CM | POA: Diagnosis not present

## 2020-12-07 DIAGNOSIS — R112 Nausea with vomiting, unspecified: Secondary | ICD-10-CM | POA: Insufficient documentation

## 2020-12-07 LAB — CBC
HCT: 43.1 % (ref 36.0–46.0)
Hemoglobin: 15.7 g/dL — ABNORMAL HIGH (ref 12.0–15.0)
MCH: 32 pg (ref 26.0–34.0)
MCHC: 36.4 g/dL — ABNORMAL HIGH (ref 30.0–36.0)
MCV: 88 fL (ref 80.0–100.0)
Platelets: 303 10*3/uL (ref 150–400)
RBC: 4.9 MIL/uL (ref 3.87–5.11)
RDW: 12.9 % (ref 11.5–15.5)
WBC: 11.4 10*3/uL — ABNORMAL HIGH (ref 4.0–10.5)
nRBC: 0 % (ref 0.0–0.2)

## 2020-12-07 LAB — URINALYSIS, ROUTINE W REFLEX MICROSCOPIC
Bilirubin Urine: NEGATIVE
Glucose, UA: NEGATIVE mg/dL
Hgb urine dipstick: NEGATIVE
Ketones, ur: 20 mg/dL — AB
Leukocytes,Ua: NEGATIVE
Nitrite: NEGATIVE
Protein, ur: 30 mg/dL — AB
Specific Gravity, Urine: 1.025 (ref 1.005–1.030)
pH: 6 (ref 5.0–8.0)

## 2020-12-07 LAB — LIPASE, BLOOD: Lipase: 30 U/L (ref 11–51)

## 2020-12-07 LAB — COMPREHENSIVE METABOLIC PANEL
ALT: 22 U/L (ref 0–44)
AST: 21 U/L (ref 15–41)
Albumin: 5.1 g/dL — ABNORMAL HIGH (ref 3.5–5.0)
Alkaline Phosphatase: 55 U/L (ref 38–126)
Anion gap: 11 (ref 5–15)
BUN: 11 mg/dL (ref 6–20)
CO2: 30 mmol/L (ref 22–32)
Calcium: 10 mg/dL (ref 8.9–10.3)
Chloride: 102 mmol/L (ref 98–111)
Creatinine, Ser: 0.84 mg/dL (ref 0.44–1.00)
GFR, Estimated: >60 mL/min (ref >60)
Glucose, Bld: 98 mg/dL (ref 70–99)
Potassium: 3.9 mmol/L (ref 3.5–5.1)
Sodium: 143 mmol/L (ref 135–145)
Total Bilirubin: 0.3 mg/dL (ref 0.3–1.2)
Total Protein: 9.1 g/dL — ABNORMAL HIGH (ref 6.5–8.1)

## 2020-12-07 LAB — I-STAT BETA HCG BLOOD, ED (MC, WL, AP ONLY): I-stat hCG, quantitative: <5 m[IU]/mL (ref <5)

## 2020-12-07 NOTE — ED Triage Notes (Signed)
Arrives via EMS from home, patient reports nausea x1 day, emesis x10 as well as diarrhea and congestion. Denies fevers. Negative rapid Covid test Friday. Intermittent abdominal pain.   18 G LAC, EMS gave 4mg  Zofran

## 2021-06-23 ENCOUNTER — Other Ambulatory Visit: Payer: Self-pay

## 2021-06-23 ENCOUNTER — Inpatient Hospital Stay (HOSPITAL_COMMUNITY)
Admission: AD | Admit: 2021-06-23 | Discharge: 2021-06-23 | Disposition: A | Discharge status: Home / Self Care | Payer: 59 | Attending: Obstetrics and Gynecology | Admitting: Obstetrics and Gynecology

## 2021-06-23 ENCOUNTER — Encounter (HOSPITAL_COMMUNITY): Payer: Self-pay

## 2021-06-23 DIAGNOSIS — O219 Vomiting of pregnancy, unspecified: Secondary | ICD-10-CM | POA: Insufficient documentation

## 2021-06-23 DIAGNOSIS — O99351 Diseases of the nervous system complicating pregnancy, first trimester: Secondary | ICD-10-CM | POA: Diagnosis not present

## 2021-06-23 DIAGNOSIS — Z87891 Personal history of nicotine dependence: Secondary | ICD-10-CM | POA: Insufficient documentation

## 2021-06-23 DIAGNOSIS — O26891 Other specified pregnancy related conditions, first trimester: Secondary | ICD-10-CM | POA: Diagnosis not present

## 2021-06-23 DIAGNOSIS — G43909 Migraine, unspecified, not intractable, without status migrainosus: Secondary | ICD-10-CM | POA: Diagnosis not present

## 2021-06-23 DIAGNOSIS — R519 Headache, unspecified: Secondary | ICD-10-CM | POA: Diagnosis present

## 2021-06-23 DIAGNOSIS — Z3A01 Less than 8 weeks gestation of pregnancy: Secondary | ICD-10-CM | POA: Diagnosis not present

## 2021-06-23 LAB — URINALYSIS, ROUTINE W REFLEX MICROSCOPIC
Bilirubin Urine: NEGATIVE
Glucose, UA: NEGATIVE mg/dL
Hgb urine dipstick: NEGATIVE
Ketones, ur: NEGATIVE mg/dL
Leukocytes,Ua: NEGATIVE
Nitrite: NEGATIVE
Protein, ur: NEGATIVE mg/dL
Specific Gravity, Urine: 1.019 (ref 1.005–1.030)
pH: 7 (ref 5.0–8.0)

## 2021-06-23 MED ORDER — LACTATED RINGERS IV BOLUS
1000.0000 mL | Freq: Once | INTRAVENOUS | Status: AC
  Administered 2021-06-23: 1000 mL via INTRAVENOUS

## 2021-06-23 MED ORDER — DIPHENHYDRAMINE HCL 50 MG/ML IJ SOLN
12.5000 mg | Freq: Once | INTRAMUSCULAR | Status: AC
  Administered 2021-06-23: 12.5 mg via INTRAVENOUS
  Filled 2021-06-23: qty 1

## 2021-06-23 MED ORDER — METOCLOPRAMIDE HCL 5 MG/ML IJ SOLN
10.0000 mg | Freq: Once | INTRAMUSCULAR | Status: AC
  Administered 2021-06-23: 10 mg via INTRAVENOUS
  Filled 2021-06-23: qty 2

## 2021-06-23 MED ORDER — DEXAMETHASONE SODIUM PHOSPHATE 10 MG/ML IJ SOLN
10.0000 mg | Freq: Once | INTRAMUSCULAR | Status: AC
  Administered 2021-06-23: 10 mg via INTRAVENOUS
  Filled 2021-06-23: qty 1

## 2021-06-23 NOTE — ED Triage Notes (Addendum)
Pt c/o headache for the past 4 days. Pt also reports nausea and vomiting. Pt states she recently found out she was pregnant this week and her last menstrual cycle was in May.

## 2021-06-23 NOTE — MAU Note (Signed)
Pt has had a headache on and for a week. Has had N/V along with it. ( Thinks in/v might be from morning sickness). Tried tylenol a few days ago but it did not help.

## 2021-06-23 NOTE — Discharge Instructions (Signed)
For Headaches: -Take Tylenol 1000 mg at the onset of your symptoms. Can take up to 3000 mg of Tylenol daily as needed. -Be sure to drink plenty of fluids and eat meals regularly daily.

## 2021-06-23 NOTE — ED Provider Notes (Signed)
Emergency Medicine Provider OB Triage Evaluation Note  Heather Lane is a 30 y.o. female, G3P2002, at Unknown gestation who presents to the emergency department with complaints of R sided headache x 4 days. Taking tylenol without relief. Associated blurry vision and "spots" in the peripheral vision of her left eye. No complete vision loss. Also with nausea and vomiting. Hx of similar headaches with prior pregnancies. Had a home positive pregnancy test; LMP in May. Followed by [facility].  Review of  Systems  Positive: headache, N/V Negative: vision loss, fever  Physical Exam  BP 94/60 (BP Location: Left Arm)   Pulse 83   Temp 98.5 F (36.9 C) (Oral)   Resp 18   Ht 5\' 5"  (1.651 m)   Wt 66.2 kg   SpO2 97%   BMI 24.30 kg/m  General: Awake, no distress  HEENT: Atraumatic  Resp: Normal effort  Cardiac: Normal rate Abd: Nondistended, nontender  MSK: Moves all extremities without difficulty Neuro: Speech clear  Medical Decision Making  Pt evaluated for pregnancy concern and is stable for transfer to MAU. Pt is in agreement with plan for transfer.  EMERGENCY DEPARTMENT PREGNANCY "Study: Limited Ultrasound of the Pelvis for Pregnancy"  INDICATIONS:Pregnancy(required) Multiple views of the uterus and pelvic cavity were obtained in real-time with a multi-frequency probe.  APPROACH:Transabdominal  PERFORMED BY: Myself IMAGES ARCHIVED?: No LIMITATIONS: Emergent procedure PREGNANCY FREE FLUID: None INTERPRETATION:  IUP visualized with fetal cardiac activity  6:16 AM Discussed with MAU APP, Erin, who accepts patient in transfer.  Clinical Impression   1. Pregnancy headache, antepartum        [place], Antony Madura 06/23/21 06/25/21    [id number], MD 06/23/21 [phone]

## 2021-06-23 NOTE — MAU Provider Note (Signed)
History    [id number]  Arrival date and time: 06/23/21 0545  Chief Complaint  Patient presents with   Headache   HPI Heather Lane is a 30 y.o. at [redacted]w[redacted]d by LMP who presents for a right sided headache. Patient reports that her headache started 3-4 days ago. It felt similar to previous headaches she experienced in a prior pregnancy. She tried taking some Tylenol and sleeping it off, but her symptoms persisted. She describes the pain as throbbing. She did has some associated blurry peripheral vision this morning but this has since resolved. She denies any vision impairment at this time. She has had associated nausea and vomiting x1. She reports that she has been trying to drink more water at home since dehydration has contributed to headaches in the past, but this has not been successful in resolving her headache so far. She denies fever, URI symptoms, abdominal pain, dysuria, or vaginal bleeding. She has presented today for further evaluation and treatment.  Vaginal bleeding: No LOF: No Fetal Movement: No Contractions: No  OB History     Gravida  3   Para  2   Term  2   Preterm      AB      Living  2      SAB      IAB      Ectopic      Multiple      Live Births  2           Past Medical History:  Diagnosis Date   Chlamydia trachomatis infection of genitourinary sites 2009   Depression    prior taking medications for PP depression   Gestational diabetes    HSV-1 (herpes simplex virus 1) infection    Leukocytosis    PID (pelvic inflammatory disease)     Past Surgical History:  Procedure Laterality Date   CESAREAN SECTION  05/2011   Arrest of dilation   CESAREAN SECTION N/A 07/18/2018   Procedure: Repeat CESAREAN SECTION;  Surgeon: Hoover Browns, MD;  Location: [facility];  Service: Obstetrics;  Laterality: N/A;    Family History  Problem Relation Age of Onset   Diabetes Sister    Diabetes Maternal Aunt    Hypertension Maternal Aunt    Diabetes  Maternal Uncle    Hypertension Maternal Uncle     Social History   Socioeconomic History   Marital status: Divorced    Spouse name: Not on file   Number of children: Not on file   Years of education: Not on file   Highest education level: Not on file  Occupational History   Not on file  Tobacco Use   Smoking status: Former    Packs/day: 1.00    Types: Cigarettes    Quit date: 03/15/2014    Years since quitting: 7.2   Smokeless tobacco: Never  Vaping Use   Vaping Use: Never used  Substance and Sexual Activity   Alcohol use: Not Currently    Alcohol/week: 3.0 standard drinks    Types: 3 Glasses of wine per week    Comment: last use dec 2018   Drug use: Not Currently    Types: Marijuana    Comment: denies   Sexual activity: Not Currently    Partners: Male    Birth control/protection: None  Other Topics Concern   Not on file  Social History Narrative   Not on file   Social Determinants of Health   Financial Resource Strain: Not on  file  Food Insecurity: Not on file  Transportation Needs: Not on file  Physical Activity: Not on file  Stress: Not on file  Social Connections: Not on file  Intimate Partner Violence: Not on file    Allergies  Allergen Reactions   Ciprofloxacin     Skin peeling   Pollen Extract Itching    No current facility-administered medications on file prior to encounter.   Current Outpatient Medications on File Prior to Encounter  Medication Sig Dispense Refill   acetaminophen (TYLENOL) 325 MG tablet Take 650 mg by mouth every 6 (six) hours as needed.     [DISCONTINUED] cetirizine (ZYRTEC) 10 MG tablet Take 10 mg by mouth daily as needed for allergies.      Review of Systems  Constitutional:  Negative for chills and fever.  HENT:  Negative for congestion.   Eyes:  Positive for blurred vision (now resolved).  Respiratory:  Negative for cough.   Cardiovascular:  Negative for leg swelling.  Gastrointestinal:  Positive for nausea and  vomiting.  Genitourinary:  Negative for dysuria.  Musculoskeletal:  Negative for falls.  Neurological:  Positive for headaches. Negative for dizziness and loss of consciousness.  Psychiatric/Behavioral:  The patient is not nervous/anxious.   Pertinent positives and negative per HPI, all others reviewed and negative.  Physical Exam   BP 107/66   Pulse 82   Temp 98.5 F (36.9 C) (Oral)   Resp 18   Ht 5\' 5"  (1.651 m)   Wt 66.2 kg   LMP 04/30/2021 (Within Weeks)   SpO2 97%   BMI 24.30 kg/m   Physical Exam Constitutional:      General: She is not in acute distress.    Appearance: She is well-developed. She is not ill-appearing.  HENT:     Head: Normocephalic and atraumatic.     Mouth/Throat:     Mouth: Mucous membranes are moist.     Pharynx: Oropharynx is clear.  Eyes:     General: No visual field deficit or scleral icterus.    Extraocular Movements: Extraocular movements intact.     Pupils: Pupils are equal, round, and reactive to light.  Cardiovascular:     Rate and Rhythm: Normal rate and regular rhythm.     Heart sounds: No murmur heard. Pulmonary:     Effort: Pulmonary effort is normal.     Breath sounds: Normal breath sounds.  Abdominal:     General: Bowel sounds are normal.     Palpations: Abdomen is soft.     Tenderness: There is no abdominal tenderness.  Musculoskeletal:        General: No swelling. Normal range of motion.     Cervical back: Normal range of motion.  Skin:    General: Skin is warm and dry.  Neurological:     Mental Status: She is alert and oriented to person, place, and time.     Cranial Nerves: No cranial nerve deficit or facial asymmetry.     Sensory: No sensory deficit.     Motor: No weakness.  Psychiatric:        Mood and Affect: Mood normal.        Behavior: Behavior normal.   Bedside Ultrasound Transabdominal ultrasound performed by ED provider 05/02/2021, PA-C: "IUP visualized with fetal cardiac activity" noted in provider  documentation per ED note.  Labs Results for orders placed or performed during the hospital encounter of 06/23/21 (from the past 24 hour(s))  Urinalysis, Routine w reflex microscopic Urine,  Clean Catch     Status: Abnormal   Collection Time: 06/23/21  7:39 AM  Result Value Ref Range   Color, Urine YELLOW YELLOW   APPearance CLOUDY (A) CLEAR   Specific Gravity, Urine 1.019 1.005 - 1.030   pH 7.0 5.0 - 8.0   Glucose, UA NEGATIVE NEGATIVE mg/dL   Hgb urine dipstick NEGATIVE NEGATIVE   Bilirubin Urine NEGATIVE NEGATIVE   Ketones, ur NEGATIVE NEGATIVE mg/dL   Protein, ur NEGATIVE NEGATIVE mg/dL   Nitrite NEGATIVE NEGATIVE   Leukocytes,Ua NEGATIVE NEGATIVE   Imaging No results found.  MAU Course  Procedures Lab Orders  Urinalysis, Routine w reflex microscopic Urine, Clean Catch  Meds ordered this encounter  Medications   lactated ringers bolus 1,000 mL   dexamethasone (DECADRON) injection 10 mg   metoCLOPramide (REGLAN) injection 10 mg   diphenhydrAMINE (BENADRYL) injection 12.5 mg   Imaging Orders  No imaging studies ordered today    Assessment and Plan  MDM Patient presents with 3-4 days of right sided headache briefly associated with blurred vision and nausea/vomiting. Hx of similar headaches with prior pregnancy. No relief with Tylenol x1, sleep, and oral fluid intake prior to arrvial. Patient is overall HDS and in no acute distress. Her neurological exam is normal. Overall presentation most consistent with migraine headache.   0900 - 1L LR bolus, IV Reglan 10 mg, IV Benadryl 12.5 mg, and IV Decadron 10 mg ordered.   1045 - Patient reassessed after interventions above and headache now resolved. No somnolence or drowsiness. Stable for discharge home with further outpatient management.  Counseled to drink plenty of fluids, eat regularly, and ensure adequate sleep for headache prevention. Recommended that she take Tylenol 1000 mg at the onset of her symptoms. Advised of max  dose of 3000 mg daily. All questions addressed.   Evalina Field, MD  Obstetrics Fellow

## 2021-06-23 NOTE — ED Notes (Signed)
Report called to MAU charge RN.

## 2021-08-21 ENCOUNTER — Inpatient Hospital Stay (HOSPITAL_COMMUNITY)
Admission: AD | Admit: 2021-08-21 | Discharge: 2021-08-22 | Disposition: A | Discharge status: Home / Self Care | Payer: Medicaid Other | Attending: Obstetrics and Gynecology | Admitting: Obstetrics and Gynecology

## 2021-08-21 ENCOUNTER — Inpatient Hospital Stay (HOSPITAL_BASED_OUTPATIENT_CLINIC_OR_DEPARTMENT_OTHER): Payer: Medicaid Other

## 2021-08-21 ENCOUNTER — Other Ambulatory Visit: Payer: Self-pay

## 2021-08-21 ENCOUNTER — Encounter (HOSPITAL_COMMUNITY): Payer: Self-pay | Admitting: Family Medicine

## 2021-08-21 DIAGNOSIS — Z881 Allergy status to other antibiotic agents status: Secondary | ICD-10-CM | POA: Diagnosis not present

## 2021-08-21 DIAGNOSIS — O34219 Maternal care for unspecified type scar from previous cesarean delivery: Secondary | ICD-10-CM | POA: Diagnosis not present

## 2021-08-21 DIAGNOSIS — D72829 Elevated white blood cell count, unspecified: Secondary | ICD-10-CM | POA: Diagnosis not present

## 2021-08-21 DIAGNOSIS — O98512 Other viral diseases complicating pregnancy, second trimester: Secondary | ICD-10-CM | POA: Insufficient documentation

## 2021-08-21 DIAGNOSIS — O99891 Other specified diseases and conditions complicating pregnancy: Secondary | ICD-10-CM | POA: Insufficient documentation

## 2021-08-21 DIAGNOSIS — O99112 Other diseases of the blood and blood-forming organs and certain disorders involving the immune mechanism complicating pregnancy, second trimester: Secondary | ICD-10-CM | POA: Diagnosis not present

## 2021-08-21 DIAGNOSIS — O26899 Other specified pregnancy related conditions, unspecified trimester: Secondary | ICD-10-CM

## 2021-08-21 DIAGNOSIS — Z3A16 16 weeks gestation of pregnancy: Secondary | ICD-10-CM | POA: Insufficient documentation

## 2021-08-21 DIAGNOSIS — O0932 Supervision of pregnancy with insufficient antenatal care, second trimester: Secondary | ICD-10-CM

## 2021-08-21 DIAGNOSIS — R102 Pelvic and perineal pain: Secondary | ICD-10-CM

## 2021-08-21 DIAGNOSIS — O26892 Other specified pregnancy related conditions, second trimester: Secondary | ICD-10-CM | POA: Diagnosis not present

## 2021-08-21 DIAGNOSIS — N949 Unspecified condition associated with female genital organs and menstrual cycle: Secondary | ICD-10-CM | POA: Diagnosis not present

## 2021-08-21 LAB — CBC
HCT: 29.8 % — ABNORMAL LOW (ref 36.0–46.0)
Hemoglobin: 11 g/dL — ABNORMAL LOW (ref 12.0–15.0)
MCH: 31.1 pg (ref 26.0–34.0)
MCHC: 36.9 g/dL — ABNORMAL HIGH (ref 30.0–36.0)
MCV: 84.2 fL (ref 80.0–100.0)
Platelets: 275 10*3/uL (ref 150–400)
RBC: 3.54 MIL/uL — ABNORMAL LOW (ref 3.87–5.11)
RDW: 13.1 % (ref 11.5–15.5)
WBC: 16.1 10*3/uL — ABNORMAL HIGH (ref 4.0–10.5)
nRBC: 0 % (ref 0.0–0.2)

## 2021-08-21 LAB — WET PREP, GENITAL
Sperm: NONE SEEN
Trich, Wet Prep: NONE SEEN
Yeast Wet Prep HPF POC: NONE SEEN

## 2021-08-21 LAB — URINALYSIS, ROUTINE W REFLEX MICROSCOPIC
Bilirubin Urine: NEGATIVE
Glucose, UA: NEGATIVE mg/dL
Hgb urine dipstick: NEGATIVE
Ketones, ur: NEGATIVE mg/dL
Leukocytes,Ua: NEGATIVE
Nitrite: NEGATIVE
Protein, ur: NEGATIVE mg/dL
Specific Gravity, Urine: 1.025 (ref 1.005–1.030)
pH: 6.5 (ref 5.0–8.0)

## 2021-08-21 MED ORDER — LIDOCAINE HCL (PF) 1 % IJ SOLN
INTRAMUSCULAR | Status: AC
  Administered 2021-08-21: 1 mL
  Filled 2021-08-21: qty 5

## 2021-08-21 MED ORDER — CYCLOBENZAPRINE HCL 5 MG PO TABS
10.0000 mg | ORAL_TABLET | Freq: Three times a day (TID) | ORAL | Status: DC | PRN
  Administered 2021-08-21: 10 mg via ORAL
  Filled 2021-08-21: qty 2

## 2021-08-21 MED ORDER — CYCLOBENZAPRINE HCL 10 MG PO TABS
10.0000 mg | ORAL_TABLET | Freq: Three times a day (TID) | ORAL | 0 refills | Status: AC | PRN
Start: 2021-08-21 — End: ?

## 2021-08-21 MED ORDER — CEFTRIAXONE SODIUM 500 MG IJ SOLR
500.0000 mg | Freq: Once | INTRAMUSCULAR | Status: AC
  Administered 2021-08-21: 500 mg via INTRAMUSCULAR
  Filled 2021-08-21: qty 500

## 2021-08-21 MED ORDER — AZITHROMYCIN 250 MG PO TABS
1000.0000 mg | ORAL_TABLET | Freq: Once | ORAL | Status: AC
  Administered 2021-08-21: 1000 mg via ORAL
  Filled 2021-08-21: qty 4

## 2021-08-21 MED ORDER — ACETAMINOPHEN 500 MG PO TABS
1000.0000 mg | ORAL_TABLET | Freq: Four times a day (QID) | ORAL | Status: DC | PRN
  Administered 2021-08-21: 1000 mg via ORAL
  Filled 2021-08-21: qty 2

## 2021-08-21 NOTE — MAU Note (Signed)
Patient arrived to MAU from work complaining of lower abdominal pain and tightness that gets worse when she moves. Patient stated that started at 4am. Pain is 8/10. She denies vaginal bleeding and or leakage of fluid. Patient has not been seen for prenatal care for this pregnancy. She stated that she is waiting on her pregnancy medicaid to become active. Patient reports increased white discharge.

## 2021-08-21 NOTE — MAU Provider Note (Addendum)
History     CSN: [id number]  Arrival date and time: 08/21/21 1744    Chief Complaint  Patient presents with   Abdominal Pain   HPI This is a G3P2002 at [redacted]w[redacted]d. No prenatal care. She presents with pelvic pain that started earlier today. She woke up with the pain. Pain is constant, worsening and in the lower pelvis. She went to work, had increasing pain with movement, sitting up straight, walking, etc. She was better with sitting still. Denies fevers, chills, nausea, vomiting. Having white clumpy discharge. Appetite is normal.  OB History     Gravida  3   Para  2   Term  2   Preterm      AB      Living  2      SAB      IAB      Ectopic      Multiple      Live Births  2           Past Medical History:  Diagnosis Date   Chlamydia trachomatis infection of genitourinary sites 2009   Depression    prior taking medications for PP depression   Gestational diabetes    HSV-1 (herpes simplex virus 1) infection    Leukocytosis    PID (pelvic inflammatory disease)     Past Surgical History:  Procedure Laterality Date   CESAREAN SECTION  05/2011   Arrest of dilation   CESAREAN SECTION N/A 07/18/2018   Procedure: Repeat CESAREAN SECTION;  Surgeon: Hoover Browns, MD;  [facility]: [facility];  Service: Obstetrics;  Laterality: N/A;    Family History  Problem Relation Age of Onset   Diabetes Sister    Diabetes Maternal Aunt    Hypertension Maternal Aunt    Diabetes Maternal Uncle    Hypertension Maternal Uncle     Social History   Tobacco Use   Smoking status: Former    Packs/day: 1.00    Types: Cigarettes    Quit date: 03/15/2014    Years since quitting: 7.4   Smokeless tobacco: Never  Vaping Use   Vaping Use: Never used  Substance Use Topics   Alcohol use: Not Currently    Alcohol/week: 3.0 standard drinks    Types: 3 Glasses of wine per week    Comment: last use dec 2018   Drug use: Not Currently    Types: Marijuana    Comment: denies     Allergies:  Allergies  Allergen Reactions   Ciprofloxacin     Skin peeling   Pollen Extract Itching    Medications Prior to Admission  Medication Sig Dispense Refill Last Dose   Prenatal Vit-Fe Fumarate-FA (MULTIVITAMIN-PRENATAL) 27-0.8 MG TABS tablet Take 1 tablet by mouth daily at 12 noon.   08/20/2021   acetaminophen (TYLENOL) 325 MG tablet Take 650 mg by mouth every 6 (six) hours as needed.       Review of Systems Physical Exam   Blood pressure 105/61, pulse 81, temperature 98.7 F (37.1 C), temperature source Oral, resp. rate 17, height 5\' 4"  (1.626 m), weight 71.7 kg, last menstrual period 04/30/2021, SpO2 100 %, unknown if currently breastfeeding.  Physical Exam Vitals and nursing note reviewed. Exam conducted with a chaperone present.  Constitutional:      Appearance: She is well-developed.  HENT:     Head: Normocephalic and atraumatic.  Cardiovascular:     Rate and Rhythm: Normal rate and regular rhythm.  Abdominal:     Palpations:  Abdomen is soft.     Tenderness: There is abdominal tenderness in the suprapubic area. There is no right CVA tenderness, left CVA tenderness, guarding or rebound.  Neurological:     General: No focal deficit present.     Mental Status: She is alert and oriented to person, place, and time.  Psychiatric:        Mood and Affect: Mood normal.        Behavior: Behavior normal.  Cervical exam: closed, thick.   Results for orders placed or performed during the hospital encounter of 08/21/21 (from the past 24 hour(s))  Urinalysis, Routine w reflex microscopic     Status: None   Collection Time: 08/21/21  7:20 PM  Result Value Ref Range   Color, Urine YELLOW YELLOW   APPearance CLEAR CLEAR   Specific Gravity, Urine 1.025 1.005 - 1.030   pH 6.5 5.0 - 8.0   Glucose, UA NEGATIVE NEGATIVE mg/dL   Hgb urine dipstick NEGATIVE NEGATIVE   Bilirubin Urine NEGATIVE NEGATIVE   Ketones, ur NEGATIVE NEGATIVE mg/dL   Protein, ur NEGATIVE NEGATIVE  mg/dL   Nitrite NEGATIVE NEGATIVE   Leukocytes,Ua NEGATIVE NEGATIVE  Wet prep, genital     Status: Abnormal   Collection Time: 08/21/21  7:25 PM   Specimen: Genital  Result Value Ref Range   Yeast Wet Prep HPF POC NONE SEEN NONE SEEN   Trich, Wet Prep NONE SEEN NONE SEEN   Clue Cells Wet Prep HPF POC PRESENT (A) NONE SEEN   WBC, Wet Prep HPF POC FEW (A) NONE SEEN   Sperm NONE SEEN   CBC     Status: Abnormal   Collection Time: 08/21/21  9:55 PM  Result Value Ref Range   WBC 16.1 (H) 4.0 - 10.5 K/uL   RBC 3.54 (L) 3.87 - 5.11 MIL/uL   Hemoglobin 11.0 (L) 12.0 - 15.0 g/dL   HCT 59.5 (L) 63.8 - 75.6 %   MCV 84.2 80.0 - 100.0 fL   MCH 31.1 26.0 - 34.0 pg   MCHC 36.9 (H) 30.0 - 36.0 g/dL   RDW 43.3 29.5 - 18.8 %   Platelets 275 150 - 400 K/uL   nRBC 0.0 0.0 - 0.2 %   MAU Course  Procedures Limited bedside US done: baby in breech position, posterior placenta. FHR 156.   MDM Vaginal cultures obtained, UA done.  Care turned over to [facility] [place]. Candelaria Celeste 08/21/21 2005  2034: Reassessment, pt appears very uncomfortable and grimacing while turning over in bed. Abd exam: TTP in SP and RLQ regions, no rebound.  2030: Pain greatly improved. CBC shows elevated WBC, unclear source but no peritoneal signs so low suspicion for acute abdominal process. Hx of Chlamydia, pt denies known recent exposure but agrees to empirical treatment. Rocephin and Z-max ordered. GC pending. Stable for discharge home.   Assessment and Plan   1. [redacted] weeks gestation of pregnancy   2. Pelvic pain in pregnancy   3. Round ligament pain   4. Leukocytosis, unspecified type    Discharge home Follow up with OB provider to start care Return precautions Rx Flexeril Tylenol/heat prn  Allergies as of 08/21/2021       Reactions   Ciprofloxacin    Skin peeling   Pollen Extract Itching        Medication List     TAKE these medications    acetaminophen 325 MG tablet Commonly known as:  TYLENOL Take 650 mg by mouth every 6 (  six) hours as needed.   cyclobenzaprine 10 MG tablet Commonly known as: FLEXERIL Take 1 tablet (10 mg total) by mouth 3 (three) times daily as needed for muscle spasms.   multivitamin-prenatal 27-0.8 MG Tabs tablet Take 1 tablet by mouth daily at 12 noon.          Donette Larry, CNM  08/21/2021 11:13 PM

## 2021-08-22 LAB — GC/CHLAMYDIA PROBE AMP (~~LOC~~) NOT AT ARMC
Chlamydia: NEGATIVE
Comment: NEGATIVE
Comment: NORMAL
Neisseria Gonorrhea: NEGATIVE

## 2021-09-02 DIAGNOSIS — Z419 Encounter for procedure for purposes other than remedying health state, unspecified: Secondary | ICD-10-CM | POA: Diagnosis not present

## 2021-09-05 DIAGNOSIS — Z01419 Encounter for gynecological examination (general) (routine) without abnormal findings: Secondary | ICD-10-CM | POA: Diagnosis not present

## 2021-09-05 DIAGNOSIS — Z113 Encounter for screening for infections with a predominantly sexual mode of transmission: Secondary | ICD-10-CM | POA: Diagnosis not present

## 2021-09-05 DIAGNOSIS — N925 Other specified irregular menstruation: Secondary | ICD-10-CM | POA: Diagnosis not present

## 2021-09-05 DIAGNOSIS — Z3403 Encounter for supervision of normal first pregnancy, third trimester: Secondary | ICD-10-CM | POA: Diagnosis not present

## 2021-09-05 LAB — OB RESULTS CONSOLE RUBELLA ANTIBODY, IGM: Rubella: IMMUNE

## 2021-09-05 LAB — HEPATITIS C ANTIBODY: HCV Ab: NEGATIVE

## 2021-09-05 LAB — OB RESULTS CONSOLE GC/CHLAMYDIA
Chlamydia: NEGATIVE
Gonorrhea: NEGATIVE

## 2021-09-05 LAB — OB RESULTS CONSOLE RPR: RPR: NONREACTIVE

## 2021-09-05 LAB — OB RESULTS CONSOLE HIV ANTIBODY (ROUTINE TESTING): HIV: NONREACTIVE

## 2021-09-05 LAB — OB RESULTS CONSOLE HEPATITIS B SURFACE ANTIGEN: Hepatitis B Surface Ag: NEGATIVE

## 2021-09-27 DIAGNOSIS — Z363 Encounter for antenatal screening for malformations: Secondary | ICD-10-CM | POA: Diagnosis not present

## 2021-09-27 DIAGNOSIS — Z3A21 21 weeks gestation of pregnancy: Secondary | ICD-10-CM | POA: Diagnosis not present

## 2021-09-27 DIAGNOSIS — Z3686 Encounter for antenatal screening for cervical length: Secondary | ICD-10-CM | POA: Diagnosis not present

## 2021-10-03 DIAGNOSIS — Z419 Encounter for procedure for purposes other than remedying health state, unspecified: Secondary | ICD-10-CM | POA: Diagnosis not present

## 2021-10-09 DIAGNOSIS — L293 Anogenital pruritus, unspecified: Secondary | ICD-10-CM | POA: Diagnosis not present

## 2021-10-09 DIAGNOSIS — N9089 Other specified noninflammatory disorders of vulva and perineum: Secondary | ICD-10-CM | POA: Diagnosis not present

## 2021-10-25 DIAGNOSIS — Z3A25 25 weeks gestation of pregnancy: Secondary | ICD-10-CM | POA: Diagnosis not present

## 2021-10-25 DIAGNOSIS — Z362 Encounter for other antenatal screening follow-up: Secondary | ICD-10-CM | POA: Diagnosis not present

## 2021-11-02 DIAGNOSIS — Z419 Encounter for procedure for purposes other than remedying health state, unspecified: Secondary | ICD-10-CM | POA: Diagnosis not present

## 2021-11-15 DIAGNOSIS — Z3403 Encounter for supervision of normal first pregnancy, third trimester: Secondary | ICD-10-CM | POA: Diagnosis not present

## 2021-11-28 ENCOUNTER — Other Ambulatory Visit: Payer: Self-pay | Admitting: Obstetrics and Gynecology

## 2021-12-01 DIAGNOSIS — R7989 Other specified abnormal findings of blood chemistry: Secondary | ICD-10-CM | POA: Diagnosis not present

## 2021-12-01 DIAGNOSIS — Z8632 Personal history of gestational diabetes: Secondary | ICD-10-CM | POA: Diagnosis not present

## 2021-12-03 DIAGNOSIS — Z419 Encounter for procedure for purposes other than remedying health state, unspecified: Secondary | ICD-10-CM | POA: Diagnosis not present

## 2021-12-12 DIAGNOSIS — E559 Vitamin D deficiency, unspecified: Secondary | ICD-10-CM | POA: Diagnosis not present

## 2021-12-12 DIAGNOSIS — Z3A32 32 weeks gestation of pregnancy: Secondary | ICD-10-CM | POA: Diagnosis not present

## 2021-12-12 DIAGNOSIS — Z833 Family history of diabetes mellitus: Secondary | ICD-10-CM | POA: Diagnosis not present

## 2021-12-12 DIAGNOSIS — Z331 Pregnant state, incidental: Secondary | ICD-10-CM | POA: Diagnosis not present

## 2021-12-12 DIAGNOSIS — O26849 Uterine size-date discrepancy, unspecified trimester: Secondary | ICD-10-CM | POA: Diagnosis not present

## 2021-12-12 DIAGNOSIS — Z148 Genetic carrier of other disease: Secondary | ICD-10-CM | POA: Diagnosis not present

## 2021-12-12 DIAGNOSIS — Z9889 Other specified postprocedural states: Secondary | ICD-10-CM | POA: Diagnosis not present

## 2021-12-12 DIAGNOSIS — A609 Anogenital herpesviral infection, unspecified: Secondary | ICD-10-CM | POA: Diagnosis not present

## 2021-12-12 DIAGNOSIS — F53 Postpartum depression: Secondary | ICD-10-CM | POA: Diagnosis not present

## 2021-12-12 DIAGNOSIS — R6889 Other general symptoms and signs: Secondary | ICD-10-CM | POA: Diagnosis not present

## 2021-12-12 DIAGNOSIS — O0933 Supervision of pregnancy with insufficient antenatal care, third trimester: Secondary | ICD-10-CM | POA: Diagnosis not present

## 2022-01-03 DIAGNOSIS — Z419 Encounter for procedure for purposes other than remedying health state, unspecified: Secondary | ICD-10-CM | POA: Diagnosis not present

## 2022-01-09 DIAGNOSIS — E559 Vitamin D deficiency, unspecified: Secondary | ICD-10-CM | POA: Diagnosis not present

## 2022-01-09 DIAGNOSIS — Z148 Genetic carrier of other disease: Secondary | ICD-10-CM | POA: Diagnosis not present

## 2022-01-09 DIAGNOSIS — O365931 Maternal care for other known or suspected poor fetal growth, third trimester, fetus 1: Secondary | ICD-10-CM | POA: Diagnosis not present

## 2022-01-09 DIAGNOSIS — F53 Postpartum depression: Secondary | ICD-10-CM | POA: Diagnosis not present

## 2022-01-09 DIAGNOSIS — Z331 Pregnant state, incidental: Secondary | ICD-10-CM | POA: Diagnosis not present

## 2022-01-09 DIAGNOSIS — Z3A36 36 weeks gestation of pregnancy: Secondary | ICD-10-CM | POA: Diagnosis not present

## 2022-01-09 DIAGNOSIS — Z369 Encounter for antenatal screening, unspecified: Secondary | ICD-10-CM | POA: Diagnosis not present

## 2022-01-09 DIAGNOSIS — Z113 Encounter for screening for infections with a predominantly sexual mode of transmission: Secondary | ICD-10-CM | POA: Diagnosis not present

## 2022-01-09 DIAGNOSIS — Z833 Family history of diabetes mellitus: Secondary | ICD-10-CM | POA: Diagnosis not present

## 2022-01-09 DIAGNOSIS — O0933 Supervision of pregnancy with insufficient antenatal care, third trimester: Secondary | ICD-10-CM | POA: Diagnosis not present

## 2022-01-09 DIAGNOSIS — Z9889 Other specified postprocedural states: Secondary | ICD-10-CM | POA: Diagnosis not present

## 2022-01-09 DIAGNOSIS — O403XX1 Polyhydramnios, third trimester, fetus 1: Secondary | ICD-10-CM | POA: Diagnosis not present

## 2022-01-12 NOTE — Patient Instructions (Signed)
Heather Lane  01/12/2022   Your procedure is scheduled on:  01/29/2022  Arrive at 0900 at Entrance C on [address] at [facility]  and [employer]. You are invited to use the FREE valet parking or use the Visitor's parking deck.  Pick up the phone at the desk and dial [phone].  Call this number if you have problems the morning of surgery: [phone]  Remember:   Do not eat food:(After Midnight) Desps de medianoche.  Do not drink clear liquids: (After Midnight) Desps de medianoche.  Take these medicines the morning of surgery with A SIP OF WATER:  none   Do not wear jewelry, make-up or nail polish.  Do not wear lotions, powders, or perfumes. Do not wear deodorant.  Do not shave 48 hours prior to surgery.  Do not bring valuables to the hospital.  [facility] is not   responsible for any belongings or valuables brought to the hospital.  Contacts, dentures or bridgework may not be worn into surgery.  Leave suitcase in the car. After surgery it may be brought to your room.  For patients admitted to the hospital, checkout time is 11:00 AM the day of              discharge.      Please read over the following fact sheets that you were given:     Preparing for Surgery

## 2022-01-15 ENCOUNTER — Encounter (HOSPITAL_COMMUNITY): Payer: Self-pay

## 2022-01-15 ENCOUNTER — Telehealth (HOSPITAL_COMMUNITY): Payer: Self-pay | Admitting: *Deleted

## 2022-01-15 NOTE — Telephone Encounter (Signed)
Preadmission screen  

## 2022-01-16 DIAGNOSIS — O403XX1 Polyhydramnios, third trimester, fetus 1: Secondary | ICD-10-CM | POA: Diagnosis not present

## 2022-01-16 DIAGNOSIS — Z3A37 37 weeks gestation of pregnancy: Secondary | ICD-10-CM | POA: Diagnosis not present

## 2022-01-23 DIAGNOSIS — O403XX0 Polyhydramnios, third trimester, not applicable or unspecified: Secondary | ICD-10-CM | POA: Diagnosis not present

## 2022-01-23 DIAGNOSIS — Z3493 Encounter for supervision of normal pregnancy, unspecified, third trimester: Secondary | ICD-10-CM | POA: Diagnosis not present

## 2022-01-23 DIAGNOSIS — Z331 Pregnant state, incidental: Secondary | ICD-10-CM | POA: Diagnosis not present

## 2022-01-23 DIAGNOSIS — Z3A38 38 weeks gestation of pregnancy: Secondary | ICD-10-CM | POA: Diagnosis not present

## 2022-01-24 ENCOUNTER — Encounter (HOSPITAL_COMMUNITY): Payer: Self-pay | Admitting: Obstetrics and Gynecology

## 2022-01-26 ENCOUNTER — Other Ambulatory Visit: Payer: Self-pay

## 2022-01-26 ENCOUNTER — Other Ambulatory Visit: Payer: Self-pay | Admitting: Obstetrics and Gynecology

## 2022-01-26 ENCOUNTER — Other Ambulatory Visit (HOSPITAL_COMMUNITY)
Admission: RE | Admit: 2022-01-26 | Discharge: 2022-01-26 | Disposition: A | Discharge status: Home / Self Care | Payer: Medicaid Other | Source: Ambulatory Visit | Attending: Obstetrics and Gynecology | Admitting: Obstetrics and Gynecology

## 2022-01-26 DIAGNOSIS — Z20822 Contact with and (suspected) exposure to covid-19: Secondary | ICD-10-CM | POA: Insufficient documentation

## 2022-01-26 DIAGNOSIS — Z01812 Encounter for preprocedural laboratory examination: Secondary | ICD-10-CM | POA: Diagnosis not present

## 2022-01-26 LAB — BASIC METABOLIC PANEL
Anion gap: 11 (ref 5–15)
BUN: 10 mg/dL (ref 6–20)
CO2: 18 mmol/L — ABNORMAL LOW (ref 22–32)
Calcium: 9 mg/dL (ref 8.9–10.3)
Chloride: 105 mmol/L (ref 98–111)
Creatinine, Ser: 0.62 mg/dL (ref 0.44–1.00)
GFR, Estimated: >60 mL/min (ref >60)
Glucose, Bld: 81 mg/dL (ref 70–99)
Potassium: 4.1 mmol/L (ref 3.5–5.1)
Sodium: 134 mmol/L — ABNORMAL LOW (ref 135–145)

## 2022-01-26 LAB — CBC
HCT: 34 % — ABNORMAL LOW (ref 36.0–46.0)
Hemoglobin: 12.6 g/dL (ref 12.0–15.0)
MCH: 30.1 pg (ref 26.0–34.0)
MCHC: 37.1 g/dL — ABNORMAL HIGH (ref 30.0–36.0)
MCV: 81.3 fL (ref 80.0–100.0)
Platelets: 292 10*3/uL (ref 150–400)
RBC: 4.18 MIL/uL (ref 3.87–5.11)
RDW: 14.1 % (ref 11.5–15.5)
WBC: 18 10*3/uL — ABNORMAL HIGH (ref 4.0–10.5)
nRBC: 0 % (ref 0.0–0.2)

## 2022-01-26 LAB — RPR: RPR Ser Ql: NONREACTIVE

## 2022-01-26 LAB — TYPE AND SCREEN
ABO/RH(D): AB POS
Antibody Screen: NEGATIVE

## 2022-01-27 LAB — SARS CORONAVIRUS 2 (TAT 6-24 HRS): SARS Coronavirus 2: NEGATIVE

## 2022-01-28 NOTE — Anesthesia Preprocedure Evaluation (Addendum)
Anesthesia Evaluation  Patient identified by MRN, date of birth, ID band Patient awake    Reviewed: Allergy & Precautions, NPO status , Patient's Chart, lab work & pertinent test results  Airway Mallampati: II  TM Distance: >3 FB Neck ROM: Full    Dental no notable dental hx. (+) Teeth Intact, Dental Advisory Given   Pulmonary Current Smoker and Patient abstained from smoking.,    Pulmonary exam normal breath sounds clear to auscultation       Cardiovascular Normal cardiovascular exam Rhythm:Regular Rate:Normal     Neuro/Psych Anxiety    GI/Hepatic negative GI ROS, Neg liver ROS,   Endo/Other  diabetes  Renal/GU      Musculoskeletal   Abdominal   Peds  Hematology Lab Results      Component                Value               Date                      WBC                      18.0 (H)            01/26/2022                HGB                      12.6                01/26/2022                HCT                      34.0 (L)            01/26/2022                MCV                      81.3                01/26/2022                PLT                      292                 01/26/2022             Type and Screen available   Anesthesia Other Findings All: Cipro  Reproductive/Obstetrics (+) Pregnancy                            Anesthesia Physical Anesthesia Plan  ASA: 3  Anesthesia Plan: Spinal   Post-op Pain Management: Regional block*   Induction:   PONV Risk Score and Plan: Treatment may vary due to age or medical condition and Ondansetron  Airway Management Planned: Natural Airway and Nasal Cannula  Additional Equipment: None  Intra-op Plan:   Post-operative Plan:   Informed Consent: I have reviewed the patients History and Physical, chart, labs and discussed the procedure including the risks, benefits and alternatives for the proposed anesthesia with the patient or  authorized representative who has indicated his/her understanding and acceptance.       Plan  Discussed with: CRNA and Anesthesiologist  Anesthesia Plan Comments: (39.1 wk G3P2  For repeat C/S x3 and BTL)       Anesthesia [place] Evaluation

## 2022-01-29 ENCOUNTER — Inpatient Hospital Stay (HOSPITAL_COMMUNITY): Payer: Medicaid Other | Admitting: Anesthesiology

## 2022-01-29 ENCOUNTER — Inpatient Hospital Stay (HOSPITAL_COMMUNITY)
Admission: RE | Admit: 2022-01-29 | Discharge: 2022-02-01 | DRG: 785 | Disposition: A | Discharge status: Home / Self Care | Payer: Medicaid Other | Source: Ambulatory Visit | Attending: Obstetrics & Gynecology | Admitting: Obstetrics & Gynecology

## 2022-01-29 ENCOUNTER — Encounter (HOSPITAL_COMMUNITY): Payer: Self-pay | Admitting: Obstetrics and Gynecology

## 2022-01-29 ENCOUNTER — Encounter (HOSPITAL_COMMUNITY)
Admission: RE | Disposition: A | Discharge status: Home / Self Care | Payer: Self-pay | Source: Ambulatory Visit | Attending: Obstetrics & Gynecology

## 2022-01-29 ENCOUNTER — Other Ambulatory Visit: Payer: Self-pay

## 2022-01-29 DIAGNOSIS — O99334 Smoking (tobacco) complicating childbirth: Secondary | ICD-10-CM | POA: Diagnosis present

## 2022-01-29 DIAGNOSIS — F17210 Nicotine dependence, cigarettes, uncomplicated: Secondary | ICD-10-CM | POA: Diagnosis present

## 2022-01-29 DIAGNOSIS — Z3A39 39 weeks gestation of pregnancy: Secondary | ICD-10-CM

## 2022-01-29 DIAGNOSIS — Z419 Encounter for procedure for purposes other than remedying health state, unspecified: Secondary | ICD-10-CM | POA: Diagnosis not present

## 2022-01-29 DIAGNOSIS — Z302 Encounter for sterilization: Secondary | ICD-10-CM | POA: Diagnosis not present

## 2022-01-29 DIAGNOSIS — O34219 Maternal care for unspecified type scar from previous cesarean delivery: Secondary | ICD-10-CM | POA: Diagnosis not present

## 2022-01-29 DIAGNOSIS — O2492 Unspecified diabetes mellitus in childbirth: Secondary | ICD-10-CM

## 2022-01-29 DIAGNOSIS — O34211 Maternal care for low transverse scar from previous cesarean delivery: Secondary | ICD-10-CM | POA: Diagnosis not present

## 2022-01-29 DIAGNOSIS — Z98891 History of uterine scar from previous surgery: Secondary | ICD-10-CM

## 2022-01-29 DIAGNOSIS — O43813 Placental infarction, third trimester: Secondary | ICD-10-CM | POA: Diagnosis not present

## 2022-01-29 HISTORY — DX: Anxiety disorder, unspecified: F41.9

## 2022-01-29 SURGERY — Surgical Case
Anesthesia: Spinal | Wound class: Clean Contaminated

## 2022-01-29 MED ORDER — OXYTOCIN-SODIUM CHLORIDE 30-0.9 UT/500ML-% IV SOLN
INTRAVENOUS | Status: DC | PRN
  Administered 2022-01-29: 200 [IU] via INTRAVENOUS

## 2022-01-29 MED ORDER — OXYCODONE HCL 5 MG/5ML PO SOLN: 5.0000 mg | Freq: Once | ORAL | Status: DC | PRN

## 2022-01-29 MED ORDER — DIPHENHYDRAMINE HCL 25 MG PO CAPS: 25.0000 mg | ORAL_CAPSULE | Freq: Four times a day (QID) | ORAL | Status: DC | PRN

## 2022-01-29 MED ORDER — KETOROLAC TROMETHAMINE 30 MG/ML IJ SOLN: 30.0000 mg | Freq: Once | INTRAMUSCULAR | Status: DC | PRN

## 2022-01-29 MED ORDER — DIPHENHYDRAMINE HCL 25 MG PO CAPS: 25.0000 mg | ORAL_CAPSULE | ORAL | Status: DC | PRN

## 2022-01-29 MED ORDER — FENTANYL CITRATE (PF) 100 MCG/2ML IJ SOLN
INTRAMUSCULAR | Status: AC
  Filled 2022-01-29: qty 2

## 2022-01-29 MED ORDER — MORPHINE SULFATE (PF) 0.5 MG/ML IJ SOLN
INTRAMUSCULAR | Status: AC
  Filled 2022-01-29: qty 10

## 2022-01-29 MED ORDER — HYDROMORPHONE HCL 1 MG/ML IJ SOLN
0.2500 mg | INTRAMUSCULAR | Status: DC | PRN
  Administered 2022-01-29: 0.5 mg via INTRAVENOUS

## 2022-01-29 MED ORDER — OXYTOCIN-SODIUM CHLORIDE 30-0.9 UT/500ML-% IV SOLN: 2.5000 [IU]/h | INTRAVENOUS | Status: AC

## 2022-01-29 MED ORDER — PRENATAL MULTIVITAMIN CH
1.0000 | ORAL_TABLET | Freq: Every day | ORAL | Status: DC
  Administered 2022-01-30 – 2022-02-01 (×3): 1 via ORAL
  Filled 2022-01-29 (×3): qty 1

## 2022-01-29 MED ORDER — MEPERIDINE HCL 25 MG/ML IJ SOLN: 6.2500 mg | INTRAMUSCULAR | Status: DC | PRN

## 2022-01-29 MED ORDER — PHENYLEPHRINE HCL-NACL 20-0.9 MG/250ML-% IV SOLN
INTRAVENOUS | Status: AC
Start: 2022-01-29 — End: ?
  Filled 2022-01-29: qty 250

## 2022-01-29 MED ORDER — LIDOCAINE HCL 1 % IJ SOLN
INTRAMUSCULAR | Status: AC
Start: 2022-01-29 — End: ?
  Filled 2022-01-29: qty 20

## 2022-01-29 MED ORDER — ACETAMINOPHEN 10 MG/ML IV SOLN
INTRAVENOUS | Status: DC | PRN
  Administered 2022-01-29: 1000 mg via INTRAVENOUS

## 2022-01-29 MED ORDER — ZOLPIDEM TARTRATE 5 MG PO TABS: 5.0000 mg | ORAL_TABLET | Freq: Every evening | ORAL | Status: DC | PRN

## 2022-01-29 MED ORDER — MENTHOL 3 MG MT LOZG: 1.0000 | LOZENGE | OROMUCOSAL | Status: DC | PRN

## 2022-01-29 MED ORDER — ONDANSETRON HCL 4 MG/2ML IJ SOLN: 4.0000 mg | Freq: Once | INTRAMUSCULAR | Status: DC | PRN

## 2022-01-29 MED ORDER — LACTATED RINGERS IV SOLN
INTRAVENOUS | Status: DC
Start: 2022-01-29 — End: 2022-01-29

## 2022-01-29 MED ORDER — ONDANSETRON HCL 4 MG/2ML IJ SOLN
INTRAMUSCULAR | Status: AC
  Filled 2022-01-29: qty 2

## 2022-01-29 MED ORDER — COCONUT OIL OIL
1.0000 "application " | TOPICAL_OIL | Status: DC | PRN
  Administered 2022-01-29: 1 via TOPICAL

## 2022-01-29 MED ORDER — FENTANYL CITRATE (PF) 100 MCG/2ML IJ SOLN
INTRAMUSCULAR | Status: DC | PRN
Start: 2022-01-29 — End: 2022-01-29
  Administered 2022-01-29: 15 ug via INTRATHECAL

## 2022-01-29 MED ORDER — SIMETHICONE 80 MG PO CHEW: 80.0000 mg | CHEWABLE_TABLET | ORAL | Status: DC | PRN

## 2022-01-29 MED ORDER — LACTATED RINGERS IV SOLN: INTRAVENOUS | Status: DC

## 2022-01-29 MED ORDER — SIMETHICONE 80 MG PO CHEW
80.0000 mg | CHEWABLE_TABLET | Freq: Three times a day (TID) | ORAL | Status: DC
  Administered 2022-01-29 – 2022-02-01 (×9): 80 mg via ORAL
  Filled 2022-01-29 (×9): qty 1

## 2022-01-29 MED ORDER — STERILE WATER FOR IRRIGATION IR SOLN
Status: DC | PRN
  Administered 2022-01-29: 1000 mL

## 2022-01-29 MED ORDER — TETANUS-DIPHTH-ACELL PERTUSSIS 5-2.5-18.5 LF-MCG/0.5 IM SUSY: 0.5000 mL | PREFILLED_SYRINGE | Freq: Once | INTRAMUSCULAR | Status: DC

## 2022-01-29 MED ORDER — DIPHENHYDRAMINE HCL 50 MG/ML IJ SOLN
INTRAMUSCULAR | Status: AC
  Filled 2022-01-29: qty 1

## 2022-01-29 MED ORDER — WITCH HAZEL-GLYCERIN EX PADS: 1.0000 "application " | MEDICATED_PAD | CUTANEOUS | Status: DC | PRN

## 2022-01-29 MED ORDER — CEFAZOLIN SODIUM-DEXTROSE 2-4 GM/100ML-% IV SOLN
2.0000 g | INTRAVENOUS | Status: AC
  Administered 2022-01-29: 2 g via INTRAVENOUS

## 2022-01-29 MED ORDER — OXYCODONE HCL 5 MG PO TABS: 5.0000 mg | ORAL_TABLET | Freq: Once | ORAL | Status: DC | PRN

## 2022-01-29 MED ORDER — SENNOSIDES-DOCUSATE SODIUM 8.6-50 MG PO TABS
2.0000 | ORAL_TABLET | Freq: Every day | ORAL | Status: DC
  Administered 2022-01-30 – 2022-02-01 (×3): 2 via ORAL
  Filled 2022-01-29 (×3): qty 2

## 2022-01-29 MED ORDER — POVIDONE-IODINE 10 % EX SWAB
2.0000 "application " | Freq: Once | CUTANEOUS | Status: AC
  Administered 2022-01-29: 2 via TOPICAL

## 2022-01-29 MED ORDER — ONDANSETRON HCL 4 MG/2ML IJ SOLN
INTRAMUSCULAR | Status: DC | PRN
  Administered 2022-01-29: 4 mg via INTRAVENOUS

## 2022-01-29 MED ORDER — IBUPROFEN 600 MG PO TABS
600.0000 mg | ORAL_TABLET | Freq: Four times a day (QID) | ORAL | Status: DC | PRN
  Administered 2022-01-29 – 2022-01-30 (×3): 600 mg via ORAL
  Filled 2022-01-29 (×3): qty 1

## 2022-01-29 MED ORDER — HYDROMORPHONE HCL 1 MG/ML IJ SOLN
INTRAMUSCULAR | Status: AC
  Filled 2022-01-29: qty 0.5

## 2022-01-29 MED ORDER — CEFAZOLIN SODIUM-DEXTROSE 2-4 GM/100ML-% IV SOLN
INTRAVENOUS | Status: AC
  Filled 2022-01-29: qty 100

## 2022-01-29 MED ORDER — MORPHINE SULFATE (PF) 0.5 MG/ML IJ SOLN
INTRAMUSCULAR | Status: DC | PRN
  Administered 2022-01-29: 150 ug via INTRATHECAL

## 2022-01-29 MED ORDER — BUPIVACAINE IN DEXTROSE 0.75-8.25 % IT SOLN
INTRATHECAL | Status: DC | PRN
  Administered 2022-01-29: 12.75 mg via INTRATHECAL

## 2022-01-29 MED ORDER — PHENYLEPHRINE HCL-NACL 20-0.9 MG/250ML-% IV SOLN
INTRAVENOUS | Status: DC | PRN
  Administered 2022-01-29: 60 ug/min via INTRAVENOUS

## 2022-01-29 MED ORDER — DIBUCAINE (PERIANAL) 1 % EX OINT: 1.0000 "application " | TOPICAL_OINTMENT | CUTANEOUS | Status: DC | PRN

## 2022-01-29 MED ORDER — DIPHENHYDRAMINE HCL 50 MG/ML IJ SOLN
12.5000 mg | INTRAMUSCULAR | Status: DC | PRN
  Administered 2022-01-29 (×2): 12.5 mg via INTRAVENOUS
  Filled 2022-01-29: qty 1

## 2022-01-29 MED ORDER — PHENYLEPHRINE 40 MCG/ML (10ML) SYRINGE FOR IV PUSH (FOR BLOOD PRESSURE SUPPORT)
PREFILLED_SYRINGE | INTRAVENOUS | Status: AC
  Filled 2022-01-29: qty 10

## 2022-01-29 MED ORDER — ACETAMINOPHEN 10 MG/ML IV SOLN
INTRAVENOUS | Status: AC
  Filled 2022-01-29: qty 100

## 2022-01-29 MED ORDER — SODIUM CHLORIDE 0.9 % IR SOLN
Status: DC | PRN
  Administered 2022-01-29: 1000 mL

## 2022-01-29 MED ORDER — OXYTOCIN-SODIUM CHLORIDE 30-0.9 UT/500ML-% IV SOLN
INTRAVENOUS | Status: AC
  Filled 2022-01-29: qty 500

## 2022-01-29 MED ORDER — OXYCODONE-ACETAMINOPHEN 5-325 MG PO TABS
1.0000 | ORAL_TABLET | ORAL | Status: DC | PRN
  Administered 2022-01-29 – 2022-01-30 (×2): 1 via ORAL
  Administered 2022-01-30 (×2): 2 via ORAL
  Filled 2022-01-29 (×2): qty 1
  Filled 2022-01-29 (×2): qty 2

## 2022-01-29 SURGICAL SUPPLY — 40 items
APL SKNCLS STERI-STRIP NONHPOA (GAUZE/BANDAGES/DRESSINGS) ×1
BENZOIN TINCTURE PRP APPL 2/3 (GAUZE/BANDAGES/DRESSINGS) ×2 IMPLANT
CLAMP CORD UMBIL (MISCELLANEOUS) ×1 IMPLANT
CLOTH BEACON ORANGE TIMEOUT ST (SAFETY) ×2 IMPLANT
DRAIN JACKSON PRT FLT 10 (DRAIN) IMPLANT
DRSG OPSITE POSTOP 4X10 (GAUZE/BANDAGES/DRESSINGS) ×2 IMPLANT
ELECT REM PT RETURN 9FT ADLT (ELECTROSURGICAL) ×2
ELECTRODE REM PT RTRN 9FT ADLT (ELECTROSURGICAL) ×1 IMPLANT
EVACUATOR SILICONE 100CC (DRAIN) IMPLANT
EXTRACTOR VACUUM M CUP 4 TUBE (SUCTIONS) IMPLANT
GLOVE BIO SURGEON STRL SZ 6.5 (GLOVE) ×2 IMPLANT
GLOVE BIOGEL PI IND STRL 7.0 (GLOVE) ×2 IMPLANT
GLOVE BIOGEL PI INDICATOR 7.0 (GLOVE) ×2
GOWN STRL REUS W/TWL LRG LVL3 (GOWN DISPOSABLE) ×4 IMPLANT
KIT ABG SYR 3ML LUER SLIP (SYRINGE) IMPLANT
NDL HYPO 25X5/8 SAFETYGLIDE (NEEDLE) IMPLANT
NEEDLE HYPO 25X5/8 SAFETYGLIDE (NEEDLE) IMPLANT
NS IRRIG 1000ML POUR BTL (IV SOLUTION) ×2 IMPLANT
PACK C SECTION WH (CUSTOM PROCEDURE TRAY) ×2 IMPLANT
PAD OB MATERNITY 4.3X12.25 (PERSONAL CARE ITEMS) ×2 IMPLANT
PENCIL SMOKE EVAC W/HOLSTER (ELECTROSURGICAL) ×2 IMPLANT
RETRACTOR TRAXI PANNICULUS (MISCELLANEOUS) IMPLANT
RTRCTR C-SECT PINK 25CM LRG (MISCELLANEOUS) IMPLANT
STRIP CLOSURE SKIN 1/2X4 (GAUZE/BANDAGES/DRESSINGS) ×2 IMPLANT
SUT CHROMIC 0 CT 1 (SUTURE) ×2 IMPLANT
SUT MNCRL AB 3-0 PS2 27 (SUTURE) ×2 IMPLANT
SUT PLAIN 2 0 (SUTURE) ×4
SUT PLAIN 2 0 XLH (SUTURE) ×2 IMPLANT
SUT PLAIN ABS 2-0 CT1 27XMFL (SUTURE) ×2 IMPLANT
SUT SILK 2 0 SH (SUTURE) IMPLANT
SUT VIC AB 0 CTX 36 (SUTURE) ×8
SUT VIC AB 0 CTX36XBRD ANBCTRL (SUTURE) ×4 IMPLANT
SUT VIC AB 2-0 SH 27 (SUTURE) ×2
SUT VIC AB 2-0 SH 27XBRD (SUTURE) IMPLANT
SUT VIC AB 3-0 SH 27 (SUTURE) ×4
SUT VIC AB 3-0 SH 27X BRD (SUTURE) IMPLANT
TOWEL OR 17X24 6PK STRL BLUE (TOWEL DISPOSABLE) ×2 IMPLANT
TRAXI PANNICULUS RETRACTOR (MISCELLANEOUS) ×1
TRAY FOLEY W/BAG SLVR 14FR LF (SET/KITS/TRAYS/PACK) ×2 IMPLANT
WATER STERILE IRR 1000ML POUR (IV SOLUTION) ×2 IMPLANT

## 2022-01-29 NOTE — Transfer of Care (Signed)
Immediate Anesthesia Transfer of Care Note  Patient: Heather Lane  Procedure(s) Performed: CESAREAN SECTION WITH BILATERAL TUBAL LIGATION  Patient Location: PACU  Anesthesia Type:Spinal  Level of Consciousness: awake, alert  and oriented  Airway & Oxygen Therapy: Patient Spontanous Breathing  Post-op Assessment: Report given to RN and Post -op Vital signs reviewed and stable  Post vital signs: Reviewed and stable  Last Vitals:  Vitals Value Taken Time  BP 111/72 01/29/22 1235  Temp    Pulse 92 01/29/22 1238  Resp 25 01/29/22 1238  SpO2 100 % 01/29/22 1238  Vitals shown include unvalidated device data.  Last Pain:  Vitals:   01/29/22 0926  TempSrc: Oral  PainSc: 0-No pain         Complications: No notable events documented.

## 2022-01-29 NOTE — H&P (Signed)
Heather Lane is a 31 y.o. female presenting for repeat CS and BTL or B salpingectomy.  . OB History     Gravida  3   Para  2   Term  2   Preterm      AB      Living  2      SAB      IAB      Ectopic      Multiple      Live Births  2          Past Medical History:  Diagnosis Date   Anxiety    Chlamydia trachomatis infection of genitourinary sites 2009   Depression    prior taking medications for PP depression   Gestational diabetes    HSV-1 (herpes simplex virus 1) infection    Leukocytosis    PID (pelvic inflammatory disease)    Past Surgical History:  Procedure Laterality Date   CESAREAN SECTION  05/2011   Arrest of dilation   CESAREAN SECTION N/A 07/18/2018   Procedure: Repeat CESAREAN SECTION;  Surgeon: Hoover Browns, MD;  Location: [facility];  Service: Obstetrics;  Laterality: N/A;   Family History: family history includes Diabetes in her maternal aunt, maternal uncle, and sister; Hypertension in her maternal aunt and maternal uncle. Social History:  reports that she has been smoking cigarettes. She has been smoking an average of .25 packs per day. She has never used smokeless tobacco. She reports that she does not currently use alcohol after a past usage of about 3.0 standard drinks per week. She reports that she does not currently use drugs after having used the following drugs: Marijuana.     Maternal Diabetes: No Genetic Screening: Declined Maternal Ultrasounds/Referrals: Normal Fetal Ultrasounds or other Referrals:  None Maternal Substance Abuse:  No Significant Maternal Medications:  None Significant Maternal Lab Results:  Group B Strep negative Other Comments:  None  Review of Systems History   Blood pressure 111/75, pulse 80, temperature 99 F (37.2 C), temperature source Oral, resp. rate 18, height 5\' 5"  (1.651 m), weight 87.1 kg, last menstrual period 04/30/2021, SpO2 99 %, currently breastfeeding. Exam Physical Exam   Physical Examination: General appearance - alert, well appearing, and in no distress Chest - clear to auscultation, no wheezes, rales or rhonchi, symmetric air entry Heart - normal rate and regular rhythm Abdomen - soft, nontender, nondistended, no masses or organomegaly gravid Pelvic - normal external genitalia, vulva, vagina, cervix, uterus and adnexa, cx clopse nt Neurological - alert, oriented, normal speech, no focal findings or movement disorder noted  Prenatal labs: ABO, Rh: --/--/AB POS (02/24 0940) Antibody: NEG (02/24 0940) Rubella: Immune (10/04 0000) RPR: NON REACTIVE (02/24 0940)  HBsAg: Negative (10/04 0000)  HIV: Non-reactive (10/04 0000)  GBS:   neg  Assessment/Plan: Term at 39 weeks fro repeat CS R&B reviewed Pt desires BTL.  R&B reviewed in detail as well as all BC    Heather Lane 01/29/2022, 10:45 AM

## 2022-01-29 NOTE — Anesthesia Postprocedure Evaluation (Signed)
Anesthesia Post Note  Patient: Heather Lane  Procedure(s) Performed: CESAREAN SECTION WITH BILATERAL TUBAL LIGATION     Patient location during evaluation: Mother Baby Anesthesia Type: Spinal Level of consciousness: oriented and awake and alert Pain management: pain level controlled Vital Signs Assessment: post-procedure vital signs reviewed and stable Respiratory status: spontaneous breathing and respiratory function stable Cardiovascular status: blood pressure returned to baseline and stable Postop Assessment: no headache, no backache, no apparent nausea or vomiting and able to ambulate Anesthetic complications: no   No notable events documented.  Last Vitals:  Vitals:   01/29/22 1430 01/29/22 1529  BP: 116/73 116/64  Pulse: 86 87  Resp: 15 16  Temp: 36.5 C 36.6 C  SpO2: 97% 97%    Last Pain:  Vitals:   01/29/22 1545  TempSrc:   PainSc: 0-No pain   Pain Goal: Patients Stated Pain Goal: 3 (01/29/22 1245)                 Trevor Iha

## 2022-01-29 NOTE — Op Note (Signed)
Cesarean Section Procedure Note   TENEA SENS  01/29/2022  Indications: Scheduled Proceedure/Maternal Request   Pre-operative Diagnosis: REPEAT CESAREAN SECTION with bilateral tubal ligation .   Post-operative Diagnosis: Same   Surgeon Wanell Lorenzi  Assistants: Essie Hart MD  Anesthesia: spinal   Procedure Details:  The patient was seen in the Holding Room. The risks, benefits, complications, treatment options, and expected outcomes were discussed with the patient. The patient concurred with the proposed plan, giving informed consent. identified as Lucianne Lei and the procedure verified as C-Section Delivery. A Time Out was held and the above information confirmed.  After induction of anesthesia, the patient was draped and prepped in the usual sterile manner. A transverse incision was made and carried down through the subcutaneous tissue to the fascia. Fascial incision was made and extended transversely. The fascia was separated from the underlying rectus tissue superiorly and inferiorly. The peritoneum was identified and entered. Peritoneal incision was extended longitudinally. The utero-vesical peritoneal reflection was incised transversely and the bladder flap was bluntly freed from the lower uterine segment. A low transverse uterine incision was made. Delivered from cephalic presentation was a 8-0 pound Living newborn infant(s) or Female with Apgar scores of 8 at one minute and 9 at five minutes. Cord ph was not sent the umbilical cord was clamped and cut cord blood was obtained for evaluation. The placenta was removed Intact and appeared normal. The uterine outline, tubes and ovaries appeared normal}. The uterine incision was closed with running locked sutures of 0Vicryl. A second layer of 0 vicryl was used to imbricate the uterus.  The patients left fallopian tube was grasped  and clamped with kelly and excised.   It was sutured with 3-0 vicryl  and noted to be hemostatic.  The  patients right fallopian tube was followed out to the fimbriated end.   And removed in the exact same fashion.   Both  tubes was sent to pathology.     Hemostasis was observed. Lavage was carried out until clear. The fascia was then reapproximated with running sutures of 0Vicryl. The skin was closed with 3-56fast absorbing plain gut.   Instrument, sponge, and needle counts were correct prior the abdominal closure and were correct at the conclusion of the case.    Findings: female infant with light meconium. Normal appearing abdominal anatomy   Estimated Blood Loss: 202cc  Total IV Fluids:   Urine Output:  100CC OF clear urine  Specimens: @ORSPECIMEN @   Complications: no complications  Disposition: PACU - hemodynamically stable.   Maternal Condition: stable   Baby condition / location:   infant to NICU.  He was having trouble maintaing his sats  Attending Attestation: I performed the procedure.   Signed: Surgeon(s): , MD Jaymes Graff, MD

## 2022-01-29 NOTE — Anesthesia Procedure Notes (Signed)
Spinal  Patient location during procedure: OB Start time: 01/29/2022 10:58 AM End time: 01/29/2022 11:05 AM Reason for block: surgical anesthesia Staffing Performed: anesthesiologist  Anesthesiologist: Barnet Glasgow, MD Preanesthetic Checklist Completed: patient identified, IV checked, risks and benefits discussed, surgical consent, monitors and equipment checked, pre-op evaluation and timeout performed Spinal Block Patient position: sitting Prep: DuraPrep and site prepped and draped Patient monitoring: heart rate, cardiac monitor, continuous pulse ox and blood pressure Approach: midline Location: L3-4 Injection technique: single-shot Needle Needle type: Pencan  Needle gauge: 24 G Needle length: 10 cm Needle insertion depth: 7 cm Assessment Sensory level: T4 Events: CSF return Additional Notes  2 Attempt (s). Pt tolerated procedure well.

## 2022-01-29 NOTE — Lactation Note (Signed)
This note was copied from a baby's chart.  NICU Lactation Consultation Note  Patient Name: Heather Lane Date: 01/29/2022 Age:30 hours   Subjective Reason for consult: Initial assessment; NICU baby  Visited with Mom, Dad "Heather Lane" was present and supportive. Mom mentioned previous breastfeeding experience with other children and had a supply issue with last child. Educated Mom on [place], setup pump and assisted Mom with pumping. Mom denies any history of breast surgeries or issues and experienced positive breast changes during pregnancy. Mom does not have a pump at home, participates in the [facility] program and provided her with contact information so LC can send a referral for breast pump. Discussed skin to skin and breastfeeding when appropriate. Family is aware of lactation services in the NICU as needed.   Objective Infant data: Mother's Current Feeding Choice: Breast Milk  Infant feeding assessment Scale for Readiness: 2     Maternal data: [name]  C-Section, Low Transverse  Does the patient have breastfeeding experience prior to this delivery?: Yes How long did the patient breastfeed?: 2 weeks and 1 month with others  WIC Program: Yes WIC Referral Sent?: Yes   Intervention/Plan Interventions: Education; DEBP; "The NICU and Your Baby" book; Infant Driven Feeding Algorithm education; [employer] brochure  Tools: Pump Pump Education: Setup, frequency, and cleaning; Milk Storage  Plan:  1) Pump every 3 hours.  2) Bring any EBM to the NICU. 3) Parents to call NICU lactation services as needed.   Consult Status: Follow-up  No data recorded   Heather Lane 01/29/2022, 3:20 PM

## 2022-01-30 ENCOUNTER — Encounter (HOSPITAL_COMMUNITY): Payer: Self-pay | Admitting: Obstetrics and Gynecology

## 2022-01-30 LAB — CBC
HCT: 29.5 % — ABNORMAL LOW (ref 36.0–46.0)
Hemoglobin: 10.5 g/dL — ABNORMAL LOW (ref 12.0–15.0)
MCH: 29.3 pg (ref 26.0–34.0)
MCHC: 35.6 g/dL (ref 30.0–36.0)
MCV: 82.4 fL (ref 80.0–100.0)
Platelets: 273 10*3/uL (ref 150–400)
RBC: 3.58 MIL/uL — ABNORMAL LOW (ref 3.87–5.11)
RDW: 14.3 % (ref 11.5–15.5)
WBC: 14.8 10*3/uL — ABNORMAL HIGH (ref 4.0–10.5)
nRBC: 0 % (ref 0.0–0.2)

## 2022-01-30 MED ORDER — GABAPENTIN 100 MG PO CAPS
100.0000 mg | ORAL_CAPSULE | Freq: Three times a day (TID) | ORAL | Status: DC
  Administered 2022-01-30 – 2022-02-01 (×5): 100 mg via ORAL
  Filled 2022-01-30 (×5): qty 1

## 2022-01-30 MED ORDER — OXYCODONE HCL 5 MG PO TABS
10.0000 mg | ORAL_TABLET | ORAL | Status: DC
  Administered 2022-01-30 – 2022-01-31 (×4): 10 mg via ORAL
  Filled 2022-01-30 (×4): qty 2

## 2022-01-30 MED ORDER — IBUPROFEN 600 MG PO TABS
600.0000 mg | ORAL_TABLET | Freq: Four times a day (QID) | ORAL | Status: DC
  Administered 2022-01-31 – 2022-02-01 (×7): 600 mg via ORAL
  Filled 2022-01-30 (×7): qty 1

## 2022-01-30 MED ORDER — ACETAMINOPHEN 325 MG PO TABS
650.0000 mg | ORAL_TABLET | Freq: Four times a day (QID) | ORAL | Status: DC
  Administered 2022-01-30 – 2022-02-01 (×8): 650 mg via ORAL
  Filled 2022-01-30 (×8): qty 2

## 2022-01-30 NOTE — Clinical Social Work Maternal (Signed)
°CLINICAL SOCIAL WORK MATERNAL/CHILD NOTE ° °Patient Details  °Name: Heather Lane °MRN: [id number] °Date of Birth: 01/05/1991 ° °Date:  01/30/2022 ° °Clinical Social Worker Initiating Note:  Camay Pedigo, LCSW Date/Time: Initiated:  01/30/22/1454    ° °Child's Name:  Heather Lane  ° °Biological Parents:  Mother, Father (Father: Heather Lane)  ° °Need for Interpreter:  None  ° °Reason for Referral:  Parental Support of Premature Babies < 32 weeks/or Critically Ill babies, Current Substance Use/Substance Use During Pregnancy  , Behavioral Health Concerns  ° °Address:  [address] [place], [place] [address] °  °Phone number:  [phone] (home)    ° °Additional phone number:  ° °Household Members/Support Persons (HM/SP):   Household Member/Support Person 1, Household Member/Support Person 2, Household Member/Support Person 3 ° ° °HM/SP Name Relationship DOB or Age  °HM/SP -1 Heather Lane FOB    °HM/SP -2 Heather Lane daughter 05/19/11  °HM/SP -3 Heather Lane son 07/18/18  °HM/SP -4        °HM/SP -5        °HM/SP -6        °HM/SP -7        °HM/SP -8        ° ° °Natural Supports (not living in the home):  Parent, Immediate Family  ° °Professional Supports: None  ° °Employment: Full-time  ° °Type of Work: Payroll Specialist  ° °Education:  Some College  ° °Homebound arranged:   ° °Financial Resources:  Medicaid  ° °Other Resources:  WIC  ° °Cultural/Religious Considerations Which May Impact Care:   ° °Strengths:  Understanding of illness, Pediatrician chosen, Ability to meet basic needs  , Home prepared for child    ° °Psychotropic Medications:        ° °Pediatrician:    [place] area ° °Pediatrician List:  ° °[facility] ([address])  °[place]    °[place]    °[place]    °[place]    °[place]    ° ° °Pediatrician Fax Number:   ° °Risk Factors/Current Problems:  Mental Health Concerns    ° °Cognitive State:  Able to  Concentrate  , Alert  , Insightful  , Goal Oriented  , Linear Thinking    ° °Mood/Affect:  Calm  , Interested  , Comfortable  , Relaxed    ° °CSW Assessment: CSW met with MOB at infant's bedside to complete psychosocial assessment, FOB present. CSW introduced self and explained role. Parents were welcoming, pleasant, and remained engaged during assessment. Parents reported that reside together along with two older children. 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 Parents reported that they have all needed items to care for infant including a car seat and basinet. CSW inquired about MOB's support system aside from FOB, MOB reported that her mom, sister, and FOB's mom are supports.  ° °CSW provided review of Sudden Infant Death Syndrome (SIDS) precautions.  ° °CSW and parents discussed infant's NICU admission. CSW informed parents about the NICU, what to expect, and resources/supports available while infant is admitted to the NICU. Parents reported that they feel well informed about infant's care and were initially anxious. CSW acknowledged and validated parents feelings of anxiousness surrounding infant's NICU admission. Parents denied any transportation barriers with visiting infant in the NICU. Parents denied any questions regarding the NICU.  ° °CSW asked FOB to leave the room to speak with MOB privately,   FOB left the room.  ° °CSW inquired about MOB's mental health history. MOB denied any mental health history. CSW inquired about any history of postpartum depression. MOB reported that she experienced postpartum depression in 2012 after having her daughter. MOB described her postpartum depression as being sad, crying, and isolating. MOB reported that her postpartum depression lasted about 6 months and went away on it's own. CSW inquired about how MOB has been feeling emotionally since giving birth, MOB reported that she was feeling worried about infant's NICU admission. MOB shared that the nurse  explained infant's care and that was helpful. MOB shared that it was her first time holding infant and that it has been helpful to hold infant. CSW acknowledged, normalized, and validated MOB's feelings. MOB presented calm and did not demonstrate any acute mental health signs/symptoms. CSW assessed for safety, MOB denied SI, HI, and domestic violence.  ° °CSW provided education regarding the baby blues period vs. perinatal mood disorders, discussed treatment and gave resources for mental health follow up if concerns arise.  CSW recommends self-evaluation during the postpartum time period using the New Mom Checklist from Postpartum Progress and encouraged MOB to contact a medical professional if symptoms are noted at any time.   ° °CSW informed MOB about the hospital drug screen policy due to documented substance use in MOB's prenatal records. 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 MOB denied any substance use during pregnancy. CSW explained that infant's CDS would be monitored and a CPS report would be made if warranted. MOB verbalized understanding and denied any CPS history. °  °CSW will continue to offer resources/supports while infant is admitted to the NICU.  ° °CSW Plan/Description:  Sudden Infant Death Syndrome (SIDS) Education, Perinatal Mood and Anxiety Disorder (PMADs) Education, Hospital Drug Screen Policy Information, CSW Will Continue to Monitor Umbilical Cord Tissue Drug Screen Results and Make Report if Warranted, Psychosocial Support and Ongoing Assessment of Needs  ° ° °Ziyon Soltau L Alondra Vandeven, LCSW °01/30/2022, 2:57 PM °

## 2022-01-30 NOTE — Lactation Note (Signed)
This note was copied from a baby's chart. Lactation Consultation Note  Patient Name: Heather Lane [id number] Date: 01/30/2022 Reason for consult: Follow-up assessment;NICU baby;Term Age:30 hours  LC and LC student visited with mom of 25 hours old FT NICU female, she's a P3 and reports she's pumping 3 times/day although; she's not getting any volume. Explained to mom the importance of consistent pumping for the onset of lactogenesis II and to prevent engorgement. Reviewed pumping expectations, lactogenesis II and storage guidelines.   WIC referral was sent to the [facility] office by previous LC, mom voiced she'll get on hold with them prior her discharge for pump issuance. Advised mom to contact the office today to get an appt lined up prior discharge date, she voiced she has their contact info and will cal them.  Maternal Data  Mom's supply is WNL  Feeding Mother's Current Feeding Choice: Breast Milk and Formula  Lactation Tools Discussed/Used Tools: Pump Breast pump type: Double-Electric Breast Pump Pump Education: Setup, frequency, and cleaning;Milk Storage Reason for Pumping: NICU infant Pumping frequency: 3 times/24 hours Pumped volume: 0 mL  Interventions Interventions: Breast feeding basics reviewed;DEBP;Education  Plan of care Encouraged mom to start pumping consistently every 3 hours, at least 8 times/24 hours  She'll schedule an appt with  the [facility] office @ [place] to P/U her pump on discharge date  No other support person at this time. All questions and concerns answered, mom to call NICU LC PRN.  Discharge Pump: DEBP  Consult Status Consult Status: Follow-up Date: 01/30/22 Follow-up type: In-patient   [name] 01/30/2022, 12:45 PM

## 2022-01-30 NOTE — Progress Notes (Signed)
MD Progress Note  Heather Lane [id number] POD# 1 repeat CS  Information for the patient's newborn:  Heather Lane [[id number]]  female   Subjective: Patient reports  feeling severe cramping Reports tolerating PO, no N/V, ambulating and voiding w/o difficulty  Pain is not controlled currently with Prn meds Denies chest pain HA/SOB/dizziness  Flatus yesterday, not today Vaginal bleeding is normal, no clots   Objective:  VS:  Vitals:   01/30/22 0440 01/30/22 0748 01/30/22 1218 01/30/22 1540  BP:  105/65 104/62 105/64  Pulse:  93 (!) 101 92  Resp:  18 18 18   Temp:  98.3 F (36.8 C) 97.8 F (36.6 C) 98.3 F (36.8 C)  TempSrc:  Oral Oral Oral  SpO2: 97% 99% 98% 98%  Weight:      Height:        Intake/Output Summary (Last 24 hours) at 01/30/2022 1856 Last data filed at 01/30/2022 1849 Gross per 24 hour  Intake 908.5 ml  Output 2825 ml  Net -1916.5 ml     Recent Labs    01/30/22 0414  WBC 14.8*  HGB 10.5*  HCT 29.5*  PLT 273    Blood type: --/--/AB POS (02/24 0940) Rubella: Immune (10/04 0000)    Physical Exam:  General: alert, cooperative, and mild distress Abdomen: soft, mild distention, tender to palpation, no rebound Incision: clean, dry, intact, and honeycomb dressing in place Uterine Fundus: firm, below umbilicus, tender Lochia: minimal Ext: extremities normal, atraumatic, no cyanosis or edema, Homans sign is negative, no sign of DVT, and no edema, redness or tenderness in the calves or thighs   Assessment/Plan: 31 y.o.  G3P3 POD #1                  In pain not well controlled,  Will make acetaminophen, motrin and oxy scheduled Will add gabapentin to regimen IV fell out so cannot give IV pain meds Encourage ambulation Routine post-op PP care          Advance diet as tolerated Advised warm fluids and ambulation to improve GI motility Warm heating pad to incision Baby boy in NICU  38, MD 01/30/2022, 6:56 PM

## 2022-01-31 DIAGNOSIS — Z419 Encounter for procedure for purposes other than remedying health state, unspecified: Secondary | ICD-10-CM | POA: Diagnosis not present

## 2022-01-31 LAB — SURGICAL PATHOLOGY

## 2022-01-31 MED ORDER — OXYCODONE HCL 5 MG PO TABS
10.0000 mg | ORAL_TABLET | ORAL | Status: DC | PRN
  Administered 2022-01-31 – 2022-02-01 (×3): 10 mg via ORAL
  Filled 2022-01-31 (×3): qty 2

## 2022-01-31 NOTE — Progress Notes (Signed)
Subjective: ?Postpartum Day 2: Cesarean Delivery ?Patient reports feeling much better and her pain is better controlled now.  ?She is still not passing flatus however.  ?She is ambulating and voiding w/o difficulty ? ?Objective: ?Vital signs in last 24 hours: ?Temp:  [97.5 ?F (36.4 ?C)-98.3 ?F (36.8 ?C)] 97.5 ?F (36.4 ?C) (03/01 0932) ?Pulse Rate:  [84-101] 97 (03/01 0932) ?Resp:  [16-18] 16 (03/01 0932) ?BP: (104-132)/(54-76) 112/65 (03/01 0932) ?SpO2:  [97 %-100 %] 97 % (03/01 0932) ? ?Physical Exam:  ?General: alert, cooperative, and no distress ?Lochia: appropriate ?Abdomen: Soft, non-tender, non-distended ?Uterine Fundus: firm ?Incision: healing well, no dehiscence, no significant erythema ?DVT Evaluation: No evidence of DVT seen on physical exam. ?Negative Homan's sign. ?No cords or calf tenderness. ? ?Recent Labs  ?  01/30/22 ?0414  ?HGB 10.5*  ?HCT 29.5*  ? ? ?Assessment/Plan: ?Status post Cesarean section. Doing well postoperatively.  ?Continue current care. ?Discharge plan for tomorrow ? ?Heather Lane ?01/31/2022, 11:10 AM ? ? ?

## 2022-01-31 NOTE — Lactation Note (Signed)
This note was copied from a baby's chart. ? ?NICU Lactation Consultation Note ? ?Patient Name: Heather Lane ?Today's Date: 01/31/2022 ?Age:30 hours ? ? ?Subjective ?Reason for consult: Follow-up assessment ?Mother has not pumped today. She pumped infrequently yesterday because of pain and sleepiness. Her plan is to resume pumping today. She began pumping session during our visit.  ?Mother denies breast changes today. ? ?Objective ?Infant data: ?Mother's Current Feeding Choice: Breast Milk and Formula ? ?Infant feeding assessment ?Scale for Readiness: 1 ?Scale for Quality: 1 ? ?  ?Maternal data: ?[id number]  ?C-Section, Low Transverse ?Pumping frequency: no pumping in past 12+ hours ?Pumped volume: 0 mL ? ?Risk factor for low milk supply:: infrequent pumping ? ? ?WIC Program: Yes ?WIC Referral Sent?: Yes ?Pump: DEBP ? ?Assessment ?Infant: ?No data recorded ?No data recorded ? ?Maternal: ?Milk volume: Normal ? ? ?Intervention/Plan ?Interventions: Education ? ?Tools: Pump ?Pump Education: Setup, frequency, and cleaning; Milk Storage ? ?Plan: ?Consult Status: Follow-up ? ?NICU Follow-up type: Verify onset of copious milk; Maternal D/C visit; Verify absence of engorgement ? ?Mother to pump q3 today and bring any EBM to NICU. ? ?Elder Negus ?01/31/2022, 1:04 PM ?

## 2022-02-01 ENCOUNTER — Encounter (HOSPITAL_COMMUNITY): Payer: Self-pay | Admitting: Obstetrics and Gynecology

## 2022-02-01 DIAGNOSIS — Z98891 History of uterine scar from previous surgery: Secondary | ICD-10-CM | POA: Diagnosis not present

## 2022-02-01 MED ORDER — SENNOSIDES-DOCUSATE SODIUM 8.6-50 MG PO TABS: 2.0000 | ORAL_TABLET | Freq: Every evening | ORAL | 3 refills | Status: DC | PRN

## 2022-02-01 MED ORDER — OXYCODONE-ACETAMINOPHEN 5-325 MG PO TABS: 1.0000 | ORAL_TABLET | ORAL | 0 refills | Status: DC | PRN

## 2022-02-01 MED ORDER — IBUPROFEN 600 MG PO TABS: 600.0000 mg | ORAL_TABLET | Freq: Four times a day (QID) | ORAL | 3 refills | Status: DC | PRN

## 2022-02-01 NOTE — Discharge Summary (Signed)
?[facility] OB Discharge Summary ? ? ?Patient Name:   Heather Lane ?DOB:     09/07/1991 ?MRN:     [id number] ? ?Date of Admission:   01/29/2022 ?Date of Discharge:  02/01/2022 ? ?Admitting diagnosis:   ? ?Status post repeat low transverse cesarean section [Z98.891] ?Cesarean delivery delivered [O82] ?Principal Problem: ?  Status post repeat low transverse cesarean section ?Active Problems: ?  Cesarean delivery delivered ? ?   ?Discharge diagnosis:   ? ?Status post repeat low transverse cesarean section [Z98.891] ?Cesarean delivery delivered [O82] ?Principal Problem: ?  Status post repeat low transverse cesarean section ?Active Problems: ?  Cesarean delivery delivered ? ?Term Pregnancy Delivered       ? ?Additional problems: none  ?                                        ?Post partum procedures: bilateral salpingectomy at time of c-section ?Augmentation: N/A ?Complications: None ? ?Hospital course: Sceduled C/S   30 y.o. yo [name] at [redacted]w[redacted]d was admitted to the hospital 01/29/2022 for scheduled cesarean section with the following indication:Elective Repeat.Delivery details are as follows:  ?Membrane Rupture Time/Date: 11:34 AM ,01/29/2022   ?Delivery Method:C-Section, Low Transverse  ?Details of operation can be found in separate operative note.  Patient had an uncomplicated postpartum course.  She is ambulating, tolerating a regular diet, passing flatus, and urinating well. Patient is discharged home in stable condition on  02/01/22 ?       ?Newborn Data: ?Birth date:01/29/20[birth year]  ?Birth time:11:34 AM  ?Gender:Female  ?Living status:Living  ?Apgars:8 ,9  ?Weight:3680 g    ? ?Magnesium Sulfate received: No ?BMZ received: No ?Rhophylac:N/A ?MMR:No ?T-DaP:Given prenatally ?Flu: No ?Transfusion:No ?                                                              ?Type of Delivery:  Repeat CS ?Delivering Provider: DILLARD, NAIMA  ?Date of Delivery:  01/29/22 ? ?Newborn Data:  ?Baby Feeding:    Breast ?Disposition:   home with mother ? ?Physical Exam: ?  ?Vitals:  ? 01/31/22 1558 01/31/22 1920 02/01/22 0445 02/01/22 0842  ?BP: 126/70 121/61 108/60 (!) 107/59  ?Pulse: 91 95 83 93  ?Resp: 14 16 16 18  ?Temp: 97.8 ?F (36.6 ?C) 98.5 ?F (36.9 ?C) 98 ?F (36.7 ?C) 97.9 ?F (36.6 ?C)  ?TempSrc: Oral Oral Oral Oral  ?SpO2: 99%  100% 97%  ?Weight:      ?Height:      ? ?General: alert, cooperative, and no distress ?Lochia: appropriate ?Uterine Fundus: firm ?Incision: Healing well with no significant drainage, No significant erythema, Dressing is clean, dry, and intact ?DVT Evaluation: No evidence of DVT seen on physical exam. ?Negative Homan's sign. ?No cords or calf tenderness. ? ?Labs: ?Lab Results  ?Component Value Date  ? WBC 14.8 (H) 01/30/2022  ? HGB 10.5 (L) 01/30/2022  ? HCT 29.5 (L) 01/30/2022  ? MCV 82.4 01/30/2022  ? PLT 273 01/30/2022  ? ?CMP Latest Ref Rng & Units 01/26/2022  ?Glucose 70 - 99 mg/dL 81  ?BUN 6 - 20 mg/dL 10  ?Creatinine 0.44 - 1.00 mg/dL 0.62  ?Sodium 135 -   145 mmol/L 134(L)  ?Potassium 3.5 - 5.1 mmol/L 4.1  ?Chloride 98 - 111 mmol/L 105  ?CO2 22 - 32 mmol/L 18(L)  ?Calcium 8.9 - 10.3 mg/dL 9.0  ?Total Protein 6.5 - 8.1 g/dL -  ?Total Bilirubin 0.3 - 1.2 mg/dL -  ?Alkaline Phos 38 - 126 U/L -  ?AST 15 - 41 U/L -  ?ALT 0 - 44 U/L -  ? ? ?Discharge instruction: per After Visit Summary and "Baby and Me Booklet". ? ?After Visit Meds:  ?Allergies as of 02/01/2022   ? ?   Reactions  ? Ciprofloxacin   ? Skin peeling  ? Pollen Extract Itching  ? ?  ? ?  ?Medication List  ?  ? ?TAKE these medications   ? ?acetaminophen 325 MG tablet ?Commonly known as: TYLENOL ?Take 650 mg by mouth every 6 (six) hours as needed. ?  ?cyclobenzaprine 10 MG tablet ?Commonly known as: FLEXERIL ?Take 1 tablet (10 mg total) by mouth 3 (three) times daily as needed for muscle spasms. ?  ?ibuprofen 600 MG tablet ?Commonly known as: ADVIL ?Take 1 tablet (600 mg total) by mouth every 6 (six) hours as needed for moderate pain  or cramping. ?  ?multivitamin-prenatal 27-0.8 MG Tabs tablet ?Take 1 tablet by mouth daily at 12 noon. ?  ?oxyCODONE-acetaminophen 5-325 MG tablet ?Commonly known as: PERCOCET/ROXICET ?Take 1-2 tablets by mouth every 4 (four) hours as needed for severe pain. ?  ?senna-docusate 8.6-50 MG tablet ?Commonly known as: Senokot-S ?Take 2 tablets by mouth at bedtime as needed for mild constipation. ?  ? ?  ? ?  ?  ? ? ?  ?Discharge Care Instructions  ?(From admission, onward)  ?  ? ? ?  ? ?  Start     Ordered  ? 02/01/22 0000  If the dressing is still on your incision site when you go home, remove it on the third day after your surgery date. Remove dressing if it begins to fall off, or if it is dirty or damaged before the third day.       ? 02/01/22 1243  ? ?  ?  ? ?  ? ? ?Diet: routine diet ? ?Activity: Advance as tolerated. Pelvic rest for 6 weeks.  ? ?Outpatient follow up:6 weeks ?Follow up Appt:No future appointments. ?Follow up visit: No follow-ups on file. ? ?Postpartum contraception: Tubal Ligation ? ?02/01/2022 ?Sanjuana Kava, MD ? ?  ?

## 2022-02-01 NOTE — Lactation Note (Signed)
This note was copied from a baby's chart. ?Lactation Consultation Note ? ?Patient Name: Heather Lane ?Today's Date: 02/01/2022 ?  ?Age:30 hours ? ?LC checked in with Mom to see how pumping coming along. Mom pumping q 3hrs for 15 min and 2x bet midnight and 6 am. Mom not able to collect any amount for feeding at this time. LC did some breast massage and hand expression, colostrum present. Mom encouraged to continue post pumping after latching using manual pump q 3hrs for 10 min each breast. Mom working on getting electric pump from [facility] as well.  ? ?Mom using 24 flanges state comfortable fit. LC attempted latch infant had recent feeding during circ only holding breast in his mouth.  ? ?Plan 1. To feed based on cues 8-12x 24hr period. Mom to offer breasts first and look for signs of milk transfer.  ?2. Mom to supplement with EBM first followed by formula with pace bottle feeding and slow flow nipple. BF supplementation guide reviewed, Mom aware if infant not latching take 60 ml or more per feeding.  ?3. Manual pump as stated above.  ? ?Mom to follow up with [facility] LC services to work on latch and monitor her progress with pumping. Outpatient [facility] services reviewed and number provided for additional support.  ? ?All questions answered at the end of the visit.  ? ?Maternal Data ?  ? ?Feeding ?Nipple Type: Slow - flow ? ?LATCH Score ?  ? ?  ? ?  ? ?  ? ?  ? ?  ? ? ?Lactation Tools Discussed/Used ?  ? ?Interventions ?  ? ?Discharge ?  ? ?Consult Status ?  ? ? ? ?Sahaj Bona  Nicholson-Springer ?02/01/2022, 12:21 PM ? ? ? ?

## 2022-02-01 NOTE — Progress Notes (Signed)
Pt given discharge instructions and all questions answered. Pt verbalized understanding. Pt currently awaiting her baby's discharge.  ?

## 2022-02-09 ENCOUNTER — Telehealth (HOSPITAL_COMMUNITY): Payer: Self-pay | Admitting: *Deleted

## 2022-02-09 NOTE — Telephone Encounter (Signed)
Mom reports feeling good. No concerns about herself at this time. EPDS=0 [facility] score=0) ?Mom reports baby is doing well. Feeding, peeing, and pooping without difficulty. Safe sleep reviewed. Mom reports no concerns about baby at present. ? ?Duffy Rhody, RN 02-09-2022 at 9:51am ?

## 2022-02-14 DIAGNOSIS — Z113 Encounter for screening for infections with a predominantly sexual mode of transmission: Secondary | ICD-10-CM | POA: Diagnosis not present

## 2022-03-03 DIAGNOSIS — Z419 Encounter for procedure for purposes other than remedying health state, unspecified: Secondary | ICD-10-CM | POA: Diagnosis not present

## 2022-03-16 DIAGNOSIS — Z3040 Encounter for surveillance of contraceptives, unspecified: Secondary | ICD-10-CM | POA: Diagnosis not present

## 2022-04-02 DIAGNOSIS — Z419 Encounter for procedure for purposes other than remedying health state, unspecified: Secondary | ICD-10-CM | POA: Diagnosis not present

## 2022-05-03 DIAGNOSIS — Z419 Encounter for procedure for purposes other than remedying health state, unspecified: Secondary | ICD-10-CM | POA: Diagnosis not present

## 2022-05-21 MED FILL — Oxytocin-Sodium Chloride 0.9% IV Soln 30 Unit/500ML: INTRAVENOUS | Qty: 500 | Status: AC

## 2022-06-02 DIAGNOSIS — Z419 Encounter for procedure for purposes other than remedying health state, unspecified: Secondary | ICD-10-CM | POA: Diagnosis not present

## 2022-07-03 DIAGNOSIS — Z419 Encounter for procedure for purposes other than remedying health state, unspecified: Secondary | ICD-10-CM | POA: Diagnosis not present

## 2022-08-03 DIAGNOSIS — Z419 Encounter for procedure for purposes other than remedying health state, unspecified: Secondary | ICD-10-CM | POA: Diagnosis not present

## 2022-09-02 DIAGNOSIS — Z419 Encounter for procedure for purposes other than remedying health state, unspecified: Secondary | ICD-10-CM | POA: Diagnosis not present

## 2022-10-03 DIAGNOSIS — Z419 Encounter for procedure for purposes other than remedying health state, unspecified: Secondary | ICD-10-CM | POA: Diagnosis not present

## 2022-11-02 DIAGNOSIS — Z419 Encounter for procedure for purposes other than remedying health state, unspecified: Secondary | ICD-10-CM | POA: Diagnosis not present

## 2022-12-03 DIAGNOSIS — Z419 Encounter for procedure for purposes other than remedying health state, unspecified: Secondary | ICD-10-CM | POA: Diagnosis not present

## 2023-01-03 DIAGNOSIS — Z419 Encounter for procedure for purposes other than remedying health state, unspecified: Secondary | ICD-10-CM | POA: Diagnosis not present

## 2023-02-01 DIAGNOSIS — Z419 Encounter for procedure for purposes other than remedying health state, unspecified: Secondary | ICD-10-CM | POA: Diagnosis not present

## 2023-03-04 DIAGNOSIS — Z419 Encounter for procedure for purposes other than remedying health state, unspecified: Secondary | ICD-10-CM | POA: Diagnosis not present

## 2023-04-03 DIAGNOSIS — Z419 Encounter for procedure for purposes other than remedying health state, unspecified: Secondary | ICD-10-CM | POA: Diagnosis not present

## 2023-05-04 DIAGNOSIS — Z419 Encounter for procedure for purposes other than remedying health state, unspecified: Secondary | ICD-10-CM | POA: Diagnosis not present

## 2023-06-03 DIAGNOSIS — Z419 Encounter for procedure for purposes other than remedying health state, unspecified: Secondary | ICD-10-CM | POA: Diagnosis not present

## 2023-07-04 DIAGNOSIS — Z419 Encounter for procedure for purposes other than remedying health state, unspecified: Secondary | ICD-10-CM | POA: Diagnosis not present

## 2023-08-04 DIAGNOSIS — Z419 Encounter for procedure for purposes other than remedying health state, unspecified: Secondary | ICD-10-CM | POA: Diagnosis not present

## 2023-09-03 DIAGNOSIS — Z419 Encounter for procedure for purposes other than remedying health state, unspecified: Secondary | ICD-10-CM | POA: Diagnosis not present

## 2023-10-03 DIAGNOSIS — K439 Ventral hernia without obstruction or gangrene: Secondary | ICD-10-CM | POA: Diagnosis not present

## 2023-10-03 DIAGNOSIS — Z01419 Encounter for gynecological examination (general) (routine) without abnormal findings: Secondary | ICD-10-CM | POA: Diagnosis not present

## 2023-10-03 DIAGNOSIS — Z113 Encounter for screening for infections with a predominantly sexual mode of transmission: Secondary | ICD-10-CM | POA: Diagnosis not present

## 2023-10-03 DIAGNOSIS — Z0000 Encounter for general adult medical examination without abnormal findings: Secondary | ICD-10-CM | POA: Diagnosis not present

## 2023-10-04 DIAGNOSIS — Z419 Encounter for procedure for purposes other than remedying health state, unspecified: Secondary | ICD-10-CM | POA: Diagnosis not present

## 2023-11-03 DIAGNOSIS — Z419 Encounter for procedure for purposes other than remedying health state, unspecified: Secondary | ICD-10-CM | POA: Diagnosis not present

## 2023-11-28 DIAGNOSIS — F4310 Post-traumatic stress disorder, unspecified: Secondary | ICD-10-CM | POA: Diagnosis not present

## 2023-12-04 DIAGNOSIS — Z419 Encounter for procedure for purposes other than remedying health state, unspecified: Secondary | ICD-10-CM | POA: Diagnosis not present

## 2023-12-12 DIAGNOSIS — F4310 Post-traumatic stress disorder, unspecified: Secondary | ICD-10-CM | POA: Diagnosis not present

## 2023-12-20 DIAGNOSIS — F4310 Post-traumatic stress disorder, unspecified: Secondary | ICD-10-CM | POA: Diagnosis not present

## 2024-01-03 DIAGNOSIS — F4310 Post-traumatic stress disorder, unspecified: Secondary | ICD-10-CM | POA: Diagnosis not present

## 2024-01-04 DIAGNOSIS — Z419 Encounter for procedure for purposes other than remedying health state, unspecified: Secondary | ICD-10-CM | POA: Diagnosis not present

## 2024-01-11 ENCOUNTER — Encounter (HOSPITAL_COMMUNITY): Payer: Self-pay

## 2024-01-11 ENCOUNTER — Other Ambulatory Visit: Payer: Self-pay

## 2024-01-11 ENCOUNTER — Emergency Department (HOSPITAL_COMMUNITY)
Admission: EM | Admit: 2024-01-11 | Discharge: 2024-01-11 | Disposition: A | Discharge status: Home / Self Care | Payer: Medicaid Other | Attending: Emergency Medicine | Admitting: Emergency Medicine

## 2024-01-11 DIAGNOSIS — R112 Nausea with vomiting, unspecified: Secondary | ICD-10-CM

## 2024-01-11 DIAGNOSIS — F10920 Alcohol use, unspecified with intoxication, uncomplicated: Secondary | ICD-10-CM | POA: Insufficient documentation

## 2024-01-11 DIAGNOSIS — F10120 Alcohol abuse with intoxication, uncomplicated: Secondary | ICD-10-CM

## 2024-01-11 LAB — COMPREHENSIVE METABOLIC PANEL
ALT: 18 U/L (ref 0–44)
AST: 25 U/L (ref 15–41)
Albumin: 4.4 g/dL (ref 3.5–5.0)
Alkaline Phosphatase: 48 U/L (ref 38–126)
Anion gap: 20 — ABNORMAL HIGH (ref 5–15)
BUN: 15 mg/dL (ref 6–20)
CO2: 19 mmol/L — ABNORMAL LOW (ref 22–32)
Calcium: 9.3 mg/dL (ref 8.9–10.3)
Chloride: 98 mmol/L (ref 98–111)
Creatinine, Ser: 0.84 mg/dL (ref 0.44–1.00)
GFR, Estimated: >60 mL/min (ref >60)
Glucose, Bld: 79 mg/dL (ref 70–99)
Potassium: 3.6 mmol/L (ref 3.5–5.1)
Sodium: 137 mmol/L (ref 135–145)
Total Bilirubin: 0.7 mg/dL (ref 0.0–1.2)
Total Protein: 7.9 g/dL (ref 6.5–8.1)

## 2024-01-11 LAB — CBC
HCT: 39.9 % (ref 36.0–46.0)
Hemoglobin: 14.7 g/dL (ref 12.0–15.0)
MCH: 31.3 pg (ref 26.0–34.0)
MCHC: 36.8 g/dL — ABNORMAL HIGH (ref 30.0–36.0)
MCV: 84.9 fL (ref 80.0–100.0)
Platelets: 358 10*3/uL (ref 150–400)
RBC: 4.7 MIL/uL (ref 3.87–5.11)
RDW: 14.1 % (ref 11.5–15.5)
WBC: 30.8 10*3/uL — ABNORMAL HIGH (ref 4.0–10.5)
nRBC: 0 % (ref 0.0–0.2)

## 2024-01-11 LAB — URINALYSIS, ROUTINE W REFLEX MICROSCOPIC
Bilirubin Urine: NEGATIVE
Glucose, UA: NEGATIVE mg/dL
Hgb urine dipstick: NEGATIVE
Ketones, ur: 80 mg/dL — AB
Leukocytes,Ua: NEGATIVE
Nitrite: NEGATIVE
Protein, ur: 30 mg/dL — AB
Specific Gravity, Urine: 1.02 (ref 1.005–1.030)
pH: 5 (ref 5.0–8.0)

## 2024-01-11 LAB — HCG, SERUM, QUALITATIVE: Preg, Serum: NEGATIVE

## 2024-01-11 LAB — LIPASE, BLOOD: Lipase: 23 U/L (ref 11–51)

## 2024-01-11 MED ORDER — ONDANSETRON 4 MG PO TBDP: 4.0000 mg | ORAL_TABLET | Freq: Three times a day (TID) | ORAL | 0 refills | Status: DC | PRN

## 2024-01-11 MED ORDER — ONDANSETRON HCL 4 MG/2ML IJ SOLN
4.0000 mg | Freq: Once | INTRAMUSCULAR | Status: AC
  Administered 2024-01-11: 4 mg via INTRAVENOUS
  Filled 2024-01-11: qty 2

## 2024-01-11 MED ORDER — LACTATED RINGERS IV BOLUS
1000.0000 mL | Freq: Once | INTRAVENOUS | Status: AC
Start: 2024-01-11 — End: 2024-01-11
  Administered 2024-01-11: 1000 mL via INTRAVENOUS

## 2024-01-11 NOTE — ED Provider Notes (Signed)
 [facility] Provider Note   CSN: [id number] Arrival date & time: 01/11/24  1843     History  Chief Complaint  Patient presents with   Emesis    Heather Lane is a 33 y.o. female.  33 year old female with a history of EtOH use and marijuana use presents emergency department nausea and vomiting.  Patient reports that yesterday she drank approximately 15 shots when she was out with friends.  Drinks on the weekends but not daily.  Says that this morning she woke up was very hung over.  Has had repeated vomiting.  Also has had some cold sweats.  No diarrhea.  Smokes marijuana daily.  Only abdominal surgeries are C-sections and salpingectomy.       Home Medications Prior to Admission medications   Medication Sig Start Date End Date Taking? Authorizing Provider  ondansetron  (ZOFRAN -ODT) 4 MG disintegrating tablet Take 1 tablet (4 mg total) by mouth every 8 (eight) hours as needed for nausea or vomiting. 01/11/24  Yes Yolande Lamar BROCKS, MD  acetaminophen  (TYLENOL ) 325 MG tablet Take 650 mg by mouth every 6 (six) hours as needed.    [provider]  cyclobenzaprine  (FLEXERIL ) 10 MG tablet Take 1 tablet (10 mg total) by mouth 3 (three) times daily as needed for muscle spasms. 08/21/21   Sung Hollering, CNM  ibuprofen  (ADVIL ) 600 MG tablet Take 1 tablet (600 mg total) by mouth every 6 (six) hours as needed for moderate pain or cramping. 02/01/22   Pinn, Walda, MD  oxyCODONE -acetaminophen  (PERCOCET/ROXICET) 5-325 MG tablet Take 1-2 tablets by mouth every 4 (four) hours as needed for severe pain. 02/01/22   Pinn, Walda, MD  Prenatal Vit-Fe Fumarate-FA (MULTIVITAMIN-PRENATAL) 27-0.8 MG TABS tablet Take 1 tablet by mouth daily at 12 noon.    [provider]  senna-docusate (SENOKOT-S) 8.6-50 MG tablet Take 2 tablets by mouth at bedtime as needed for mild constipation. 02/01/22   Pinn, Walda, MD  cetirizine (ZYRTEC) 10 MG tablet Take 10 mg  by mouth daily as needed for allergies.  06/16/19  [provider]      Allergies    Ciprofloxacin  and Pollen extract    Review of Systems   Review of Systems  Physical Exam Updated Vital Signs BP 103/65 (BP Location: Right Arm)   Pulse 77   Temp 97.7 F (36.5 C) (Oral)   Resp 20   Ht 5' 5 (1.651 m)   SpO2 98%   BMI 31.95 kg/m  Physical Exam Vitals and nursing note reviewed.  Constitutional:      General: She is not in acute distress.    Appearance: She is well-developed.  HENT:     Head: Normocephalic and atraumatic.     Right Ear: External ear normal.     Left Ear: External ear normal.     Nose: Nose normal.  Eyes:     Extraocular Movements: Extraocular movements intact.     Conjunctiva/sclera: Conjunctivae normal.     Pupils: Pupils are equal, round, and reactive to light.  Cardiovascular:     Rate and Rhythm: Normal rate and regular rhythm.     Heart sounds: No murmur heard. Pulmonary:     Effort: Pulmonary effort is normal. No respiratory distress.  Abdominal:     General: Abdomen is flat. There is no distension.     Palpations: Abdomen is soft. There is no mass.     Tenderness: There is no abdominal tenderness. There is  no guarding.  Musculoskeletal:     Cervical back: Normal range of motion and neck supple.  Skin:    General: Skin is warm and dry.  Neurological:     Mental Status: She is alert and oriented to person, place, and time. Mental status is at baseline.  Psychiatric:        Mood and Affect: Mood normal.     ED Results / Procedures / Treatments   Labs (all labs ordered are listed, but only abnormal results are displayed) Labs Reviewed  COMPREHENSIVE METABOLIC PANEL - Abnormal; Notable for the following components:      Result Value   CO2 19 (*)    Anion gap 20 (*)    All other components within normal limits  CBC - Abnormal; Notable for the following components:   WBC 30.8 (*)    MCHC 36.8 (*)    All other components within  normal limits  URINALYSIS, ROUTINE W REFLEX MICROSCOPIC - Abnormal; Notable for the following components:   APPearance HAZY (*)    Ketones, ur 80 (*)    Protein, ur 30 (*)    Bacteria, UA FEW (*)    All other components within normal limits  LIPASE, BLOOD  HCG, SERUM, QUALITATIVE    EKG None  Radiology No results found.  Procedures Procedures    Medications Ordered in ED Medications  ondansetron  (ZOFRAN ) injection 4 mg (4 mg Intravenous Given 01/11/24 2128)  lactated ringers  bolus 1,000 mL (0 mLs Intravenous Stopped 01/11/24 2245)    ED Course/ Medical Decision Making/ A&P                                 Medical Decision Making Amount and/or Complexity of Data Reviewed Labs: ordered.  Risk Prescription drug management.   Heather Lane is a 33 y.o. female with comorbidities that complicate the patient evaluation including  EtOH use and marijuana use presents emergency department nausea and vomiting.   Initial Ddx:  Hangover, electrolyte abnormality, Boerhaave's syndrome, gastroenteritis, paranoid hyperemesis  MDM/Course:  Patient presents emergency department with nausea and vomiting and sweats in the setting of drinking 15 shots last night.  Does not drink daily so low concern for alcohol withdrawal at this time.  Suspect that she is hung over.  No abdominal tenderness palpation on exam.  Low concern for any other intra-abdominal abscess/infection that would warrant imaging and/or Boerhaave syndrome.  Was given lactated Ringer 's and Zofran  and was feeling much better.  Able to tolerate p.o.  On repeat evaluation.  No significant lab abnormalities.  Patient discharged home with prescription for Zofran .  This patient presents to the ED for concern of complaints listed in HPI, this involves an extensive number of treatment options, and is a complaint that carries with it a high risk of complications and morbidity. Disposition including potential need for admission  considered.   Dispo: DC Home. Return precautions discussed including, but not limited to, those listed in the AVS. Allowed pt time to ask questions which were answered fully prior to dc.  Additional history obtained from significant other Records reviewed Outpatient Clinic Notes The following labs were independently interpreted: Chemistry and show no acute abnormality I personally reviewed and interpreted cardiac monitoring: normal sinus rhythm  I personally reviewed and interpreted the pt's EKG: see above for interpretation  I have reviewed the patients home medications and made adjustments as needed  Portions of this note  were generated with Scientist, clinical (histocompatibility and immunogenetics). Dictation errors may occur despite best attempts at proofreading.     Final Clinical Impression(s) / ED Diagnoses Final diagnoses:  Hangover without complication (HCC)  Nausea and vomiting, unspecified vomiting type    Rx / DC Orders ED Discharge Orders          Ordered    ondansetron  (ZOFRAN -ODT) 4 MG disintegrating tablet  Every 8 hours PRN        01/11/24 2132              Yolande Lamar BROCKS, MD 01/12/24 [phone]

## 2024-01-11 NOTE — Discharge Instructions (Addendum)
 You were seen for your hangover in the emergency department.  At home, please take the Zofran  for your nausea and vomiting. Please be sure to stay well-hydrated.  Follow-up with your primary doctor in 2-3 days regarding your visit.  Return immediately to the emergency department if you experience any of the following: fainting, abdominal pain, high fevers, or any other concerning symptoms.  Thank you for visiting our Emergency Department. It was a pleasure taking care of you today.

## 2024-01-11 NOTE — ED Triage Notes (Signed)
 Patient reports she drank a lot last night and has been vomiting all day and can't keep anything down.  She thinks she has alcohol poisoning.

## 2024-01-14 DIAGNOSIS — F4310 Post-traumatic stress disorder, unspecified: Secondary | ICD-10-CM | POA: Diagnosis not present

## 2024-01-24 DIAGNOSIS — F4310 Post-traumatic stress disorder, unspecified: Secondary | ICD-10-CM | POA: Diagnosis not present

## 2024-02-01 DIAGNOSIS — Z419 Encounter for procedure for purposes other than remedying health state, unspecified: Secondary | ICD-10-CM | POA: Diagnosis not present

## 2024-02-07 DIAGNOSIS — F4310 Post-traumatic stress disorder, unspecified: Secondary | ICD-10-CM | POA: Diagnosis not present

## 2024-03-14 DIAGNOSIS — Z419 Encounter for procedure for purposes other than remedying health state, unspecified: Secondary | ICD-10-CM | POA: Diagnosis not present

## 2024-04-13 DIAGNOSIS — Z419 Encounter for procedure for purposes other than remedying health state, unspecified: Secondary | ICD-10-CM | POA: Diagnosis not present

## 2024-05-14 DIAGNOSIS — Z419 Encounter for procedure for purposes other than remedying health state, unspecified: Secondary | ICD-10-CM | POA: Diagnosis not present

## 2024-06-13 DIAGNOSIS — Z419 Encounter for procedure for purposes other than remedying health state, unspecified: Secondary | ICD-10-CM | POA: Diagnosis not present

## 2024-07-14 DIAGNOSIS — Z419 Encounter for procedure for purposes other than remedying health state, unspecified: Secondary | ICD-10-CM | POA: Diagnosis not present

## 2024-08-14 DIAGNOSIS — Z419 Encounter for procedure for purposes other than remedying health state, unspecified: Secondary | ICD-10-CM | POA: Diagnosis not present

## 2024-09-19 ENCOUNTER — Ambulatory Visit (HOSPITAL_COMMUNITY)
Admission: EM | Admit: 2024-09-19 | Discharge: 2024-09-19 | Disposition: A | Discharge status: Home / Self Care | Attending: Physician Assistant | Admitting: Physician Assistant

## 2024-09-19 ENCOUNTER — Encounter (HOSPITAL_COMMUNITY): Payer: Self-pay | Admitting: Emergency Medicine

## 2024-09-19 DIAGNOSIS — R051 Acute cough: Secondary | ICD-10-CM

## 2024-09-19 DIAGNOSIS — J208 Acute bronchitis due to other specified organisms: Secondary | ICD-10-CM | POA: Diagnosis not present

## 2024-09-19 DIAGNOSIS — R0602 Shortness of breath: Secondary | ICD-10-CM

## 2024-09-19 MED ORDER — PREDNISONE 20 MG PO TABS: 40.0000 mg | ORAL_TABLET | Freq: Every day | ORAL | 0 refills | Status: AC

## 2024-09-19 MED ORDER — ALBUTEROL SULFATE HFA 108 (90 BASE) MCG/ACT IN AERS
INHALATION_SPRAY | RESPIRATORY_TRACT | Status: AC
  Filled 2024-09-19: qty 6.7

## 2024-09-19 MED ORDER — ALBUTEROL SULFATE HFA 108 (90 BASE) MCG/ACT IN AERS
2.0000 | INHALATION_SPRAY | Freq: Once | RESPIRATORY_TRACT | Status: AC
  Administered 2024-09-19: 2 via RESPIRATORY_TRACT

## 2024-09-19 NOTE — ED Triage Notes (Signed)
 PT st's she had cold symptoms 1 week ago with productive cough   Pt st's now cough is dry and her voice is hoarse.  Denies sore throat

## 2024-09-19 NOTE — Discharge Instructions (Signed)
 We are treating you for bronchitis.  Use the albuterol  inhaler every 4-6 hours as needed.  Start prednisone  40 mg for 5 days.  Do not take NSAIDs with this medication including aspirin, ibuprofen /Advil , naproxen/Aleve.  You can use Tylenol , Mucinex, Flonase, nasal saline/sinus rinses for additional symptom relief.  Take Promethazine DM for cough.  This will make you sleepy so do not drive or drink alcohol while taking it.  If you are not feeling better in a week please return for reevaluation.  If anything worsens and you have increasing cough, fever, nausea/vomiting, chest pain, shortness of breath you need to be seen immediately.

## 2024-09-19 NOTE — ED Provider Notes (Signed)
 [facility]    CSN: [id number] Arrival date & time: 09/19/24  1029      History   Chief Complaint Chief Complaint  Patient presents with   Cough    HPI Heather Lane is a 33 y.o. female.   Patient presents today with an 8-day history of URI symptoms including nasal congestion, sore throat related to coughing, cough.  She reports that initially she assumed this was a mild URI and was taking over-the-counter medicine such as DayQuil and NyQuil.  This provided temporary improvement of symptoms but then over the last several days she has had worsening cough to the point that is causing her to be short of breath.  She denies any fever, productive cough, nausea, vomiting, diarrhea, chest pain.  She denies any known sick contacts but did develop symptoms around the time of a antique homecoming where she was exposed to many people.  She has used an inhaler in the past but denies formal diagnosis of asthma or COPD.  She does smoke.  Denies any recent antibiotics or steroids.  She is having difficulty with her daily diabetes as a result of symptoms.  She is not breast-feeding and denies any concern for pregnancy.    Past Medical History:  Diagnosis Date   Anxiety    Chlamydia trachomatis infection of genitourinary sites 2009   Depression    prior taking medications for PP depression   Gestational diabetes    HSV-1 (herpes simplex virus 1) infection    Leukocytosis    PID (pelvic inflammatory disease)     Patient Active Problem List   Diagnosis Date Noted   Status post repeat low transverse cesarean section 01/29/2022   Cesarean delivery delivered 01/29/2022   Gestational diabetes mellitus (GDM) controlled on oral hypoglycemic drug 07/20/2018   Previous cesarean delivery, delivered 07/18/2018   Status post cesarean section 07/18/2018   Vaginal bleeding 09/08/2017   Bilateral wheezing 09/08/2017   Cough in adult 09/08/2017   Pelvic abscess in female 03/21/2014    Chlamydia 03/21/2014   History of herpes genitalis 03/21/2014   Gonorrhea 03/14/2014   Previous cesarean delivery 01/27/2013   Cigarette smoker 01/27/2013    Past Surgical History:  Procedure Laterality Date   CESAREAN SECTION  05/2011   Arrest of dilation   CESAREAN SECTION N/A 07/18/2018   Procedure: Repeat CESAREAN SECTION;  Surgeon: Gloriann Chick, MD;  Location: [facility];  Service: Obstetrics;  Laterality: N/A;   CESAREAN SECTION WITH BILATERAL TUBAL LIGATION N/A 01/29/2022   Procedure: CESAREAN SECTION WITH BILATERAL TUBAL LIGATION;  Surgeon: Armond Cape, MD;  Location: [facility];  Service: Obstetrics;  Laterality: N/A;  REPEAT CESAREAN SECTION WITH BILATERAL SALPINGECTOMY    OB History     Gravida  3   Para  3   Term  3   Preterm      AB      Living  3      SAB      IAB      Ectopic      Multiple  0   Live Births  3            Home Medications    Prior to Admission medications   Medication Sig Start Date End Date Taking? Authorizing Provider  albuterol  (VENTOLIN  HFA) 108 (90 Base) MCG/ACT inhaler Inhale 1-2 puffs into the lungs every 6 (six) hours as needed for wheezing or shortness of breath. 09/19/24  Yes Tilton Marsalis, Rocky POUR, PA-C  predniSONE  (DELTASONE ) 20 MG tablet Take 2 tablets (40 mg total) by mouth daily for 5 days. 09/19/24 09/24/24 Yes Ora Mcnatt K, PA-C  promethazine-dextromethorphan (PROMETHAZINE-DM) 6.25-15 MG/5ML syrup Take 5 mLs by mouth 2 (two) times daily as needed for cough. 09/19/24  Yes Latrenda Irani K, PA-C  cetirizine (ZYRTEC) 10 MG tablet Take 10 mg by mouth daily as needed for allergies.  06/16/19  [provider]    Family History Family History  Problem Relation Age of Onset   Diabetes Sister    Diabetes Maternal Aunt    Hypertension Maternal Aunt    Diabetes Maternal Uncle    Hypertension Maternal Uncle     Social History Social History   Tobacco Use   Smoking status: Every Day    Current packs/day:  0.00    Types: Cigarettes    Last attempt to quit: 03/15/2014    Years since quitting: 10.5   Smokeless tobacco: Never  Vaping Use   Vaping status: Never Used  Substance Use Topics   Alcohol use: Yes    Alcohol/week: 3.0 standard drinks of alcohol    Types: 3 Glasses of wine per week   Drug use: Yes    Types: Marijuana    Comment: denies     Allergies   Ciprofloxacin  and Pollen extract   Review of Systems Review of Systems  Constitutional:  Positive for activity change. Negative for appetite change, fatigue and fever.  HENT:  Positive for congestion and sore throat (from coughing). Negative for postnasal drip, sinus pressure and sneezing.   Respiratory:  Positive for cough and shortness of breath.   Cardiovascular:  Negative for chest pain.  Gastrointestinal:  Negative for abdominal pain, diarrhea, nausea and vomiting.  Neurological:  Negative for dizziness, light-headedness and headaches.     Physical Exam Triage Vital Signs ED Triage Vitals  Encounter Vitals Group     BP 09/19/24 1126 119/87     Girls Systolic BP Percentile --      Girls Diastolic BP Percentile --      Boys Systolic BP Percentile --      Boys Diastolic BP Percentile --      Pulse Rate 09/19/24 1126 96     Resp 09/19/24 1126 18     Temp 09/19/24 1126 98.6 F (37 C)     Temp Source 09/19/24 1126 Oral     SpO2 09/19/24 1126 98 %     Weight 09/19/24 1128 135 lb (61.2 kg)     Height 09/19/24 1128 5' 4 (1.626 m)     Head Circumference --      Peak Flow --      Pain Score 09/19/24 1128 0     Pain Loc --      Pain Education --      Exclude from Growth Chart --    No data found.  Updated Vital Signs BP 119/87 (BP Location: Right Arm)   Pulse 96   Temp 98.6 F (37 C) (Oral)   Resp 18   Ht 5' 4 (1.626 m)   Wt 135 lb (61.2 kg)   LMP 09/19/2024 (Exact Date)   SpO2 98%   BMI 23.17 kg/m   Visual Acuity Right Eye Distance:   Left Eye Distance:   Bilateral Distance:    Right Eye Near:    Left Eye Near:    Bilateral Near:     Physical Exam Vitals reviewed.  Constitutional:      General: She is  awake. She is not in acute distress.    Appearance: Normal appearance. She is well-developed. She is not ill-appearing.     Comments: Very pleasant female appears stated age in no acute distress sitting comfortably in exam room  HENT:     Head: Normocephalic and atraumatic.     Right Ear: External ear normal. There is impacted cerumen.     Left Ear: Tympanic membrane, ear canal and external ear normal. Tympanic membrane is not erythematous or bulging.     Nose:     Right Sinus: No maxillary sinus tenderness or frontal sinus tenderness.     Left Sinus: No maxillary sinus tenderness or frontal sinus tenderness.     Mouth/Throat:     Pharynx: Uvula midline. Posterior oropharyngeal erythema and postnasal drip present. No oropharyngeal exudate.  Cardiovascular:     Rate and Rhythm: Normal rate and regular rhythm.     Heart sounds: Normal heart sounds, S1 normal and S2 normal. No murmur heard. Pulmonary:     Effort: Pulmonary effort is normal.     Breath sounds: Normal breath sounds. No wheezing, rhonchi or rales.     Comments: Reactive cough with deep breathing Lymphadenopathy:     Head:     Left side of head: No tonsillar adenopathy.  Psychiatric:        Behavior: Behavior is cooperative.      UC Treatments / Results  Labs (all labs ordered are listed, but only abnormal results are displayed) Labs Reviewed - No data to display  EKG   Radiology No results found.  Procedures Procedures (including critical care time)  Medications Ordered in UC Medications  albuterol  (VENTOLIN  HFA) 108 (90 Base) MCG/ACT inhaler 2 puff (2 puffs Inhalation Given 09/19/24 1150)    Initial Impression / Assessment and Plan / UC Course  I have reviewed the triage vital signs and the nursing notes.  Pertinent labs & imaging results that were available during my care of the patient were  reviewed by me and considered in my medical decision making (see chart for details).     Patient is well-appearing, afebrile, nontoxic, nontachycardic.  Viral testing was deferred as patient has been symptomatic for over a week and this would not change our management.  No evidence of acute infection on physical exam that warrant initiation of antibiotics.  Chest x-ray was deferred as she had no adventitious lung sounds on exam and her oxygen saturation was 98%.  She was given albuterol  inhaler due to reactive cough which provided significant relief of symptoms and so was sent home with this medication to be used every 4-6 hours as needed.  Will treat with viral bronchitis with prednisone  burst of 40 mg for 5 days.  We discussed that she is not to take NSAIDs with this medication due to risk of GI bleeding.  She was also given Promethazine DM for cough.  We discussed that this can be sedating and she is not to drive or drink alcohol with taking it.  She can use over-the-counter medication including Tylenol , Mucinex, Flonase.  If she is not feeling better within a week she is to return for reevaluation.  If she has any worsening symptoms she needs to be seen immediately.  Strict return precautions given.  Excuse note provided.  Final Clinical Impressions(s) / UC Diagnoses   Final diagnoses:  Acute viral bronchitis  Acute cough  Shortness of breath     Discharge Instructions      We are treating  you for bronchitis.  Use the albuterol  inhaler every 4-6 hours as needed.  Start prednisone  40 mg for 5 days.  Do not take NSAIDs with this medication including aspirin, ibuprofen /Advil , naproxen/Aleve.  You can use Tylenol , Mucinex, Flonase, nasal saline/sinus rinses for additional symptom relief.  Take Promethazine DM for cough.  This will make you sleepy so do not drive or drink alcohol while taking it.  If you are not feeling better in a week please return for reevaluation.  If anything worsens and you  have increasing cough, fever, nausea/vomiting, chest pain, shortness of breath you need to be seen immediately.     ED Prescriptions     Medication Sig Dispense Auth. Provider   albuterol  (VENTOLIN  HFA) 108 (90 Base) MCG/ACT inhaler Inhale 1-2 puffs into the lungs every 6 (six) hours as needed for wheezing or shortness of breath. 18 g Theresea Trautmann K, PA-C   predniSONE  (DELTASONE ) 20 MG tablet Take 2 tablets (40 mg total) by mouth daily for 5 days. 10 tablet Darline Faith K, PA-C   promethazine-dextromethorphan (PROMETHAZINE-DM) 6.25-15 MG/5ML syrup Take 5 mLs by mouth 2 (two) times daily as needed for cough. 118 mL Autum Benfer K, PA-C      PDMP not reviewed this encounter.   Sherrell Rocky POUR, PA-C 09/19/24 1220

## 2024-10-14 DIAGNOSIS — Z419 Encounter for procedure for purposes other than remedying health state, unspecified: Secondary | ICD-10-CM | POA: Diagnosis not present

## 2024-11-13 DIAGNOSIS — Z419 Encounter for procedure for purposes other than remedying health state, unspecified: Secondary | ICD-10-CM | POA: Diagnosis not present
